# Patient Record
Sex: Male | Born: 1950 | Race: White | Hispanic: No | Marital: Married | Smoking: Former smoker
Health system: Southern US, Community
[De-identification: ages and names within clinical notes are randomized; demographics above are authoritative.]

## PROBLEM LIST (undated history)

## (undated) DIAGNOSIS — E785 Hyperlipidemia, unspecified: Secondary | ICD-10-CM

## (undated) DIAGNOSIS — M1990 Unspecified osteoarthritis, unspecified site: Secondary | ICD-10-CM

## (undated) DIAGNOSIS — I493 Ventricular premature depolarization: Secondary | ICD-10-CM

## (undated) DIAGNOSIS — I480 Paroxysmal atrial fibrillation: Secondary | ICD-10-CM

## (undated) DIAGNOSIS — T7840XA Allergy, unspecified, initial encounter: Secondary | ICD-10-CM

## (undated) DIAGNOSIS — K219 Gastro-esophageal reflux disease without esophagitis: Secondary | ICD-10-CM

## (undated) DIAGNOSIS — K649 Unspecified hemorrhoids: Secondary | ICD-10-CM

## (undated) DIAGNOSIS — I491 Atrial premature depolarization: Secondary | ICD-10-CM

## (undated) DIAGNOSIS — J189 Pneumonia, unspecified organism: Secondary | ICD-10-CM

## (undated) DIAGNOSIS — F419 Anxiety disorder, unspecified: Secondary | ICD-10-CM

## (undated) DIAGNOSIS — R748 Abnormal levels of other serum enzymes: Secondary | ICD-10-CM

## (undated) DIAGNOSIS — D1803 Hemangioma of intra-abdominal structures: Secondary | ICD-10-CM

## (undated) DIAGNOSIS — IMO0002 Reserved for concepts with insufficient information to code with codable children: Secondary | ICD-10-CM

## (undated) DIAGNOSIS — I2510 Atherosclerotic heart disease of native coronary artery without angina pectoris: Secondary | ICD-10-CM

## (undated) DIAGNOSIS — I10 Essential (primary) hypertension: Secondary | ICD-10-CM

## (undated) HISTORY — DX: Reserved for concepts with insufficient information to code with codable children: IMO0002

## (undated) HISTORY — PX: INGUINAL HERNIA REPAIR: SUR1180

## (undated) HISTORY — PX: KNEE SURGERY: SHX244

## (undated) HISTORY — DX: Unspecified osteoarthritis, unspecified site: M19.90

## (undated) HISTORY — DX: Hyperlipidemia, unspecified: E78.5

## (undated) HISTORY — DX: Hemangioma of intra-abdominal structures: D18.03

## (undated) HISTORY — DX: Abnormal levels of other serum enzymes: R74.8

## (undated) HISTORY — DX: Ventricular premature depolarization: I49.3

## (undated) HISTORY — DX: Essential (primary) hypertension: I10

## (undated) HISTORY — PX: SEPTOPLASTY: SUR1290

## (undated) HISTORY — DX: Unspecified hemorrhoids: K64.9

## (undated) HISTORY — DX: Paroxysmal atrial fibrillation: I48.0

## (undated) HISTORY — DX: Allergy, unspecified, initial encounter: T78.40XA

## (undated) HISTORY — DX: Atherosclerotic heart disease of native coronary artery without angina pectoris: I25.10

## (undated) HISTORY — DX: Pneumonia, unspecified organism: J18.9

## (undated) HISTORY — DX: Anxiety disorder, unspecified: F41.9

## (undated) HISTORY — DX: Atrial premature depolarization: I49.1

## (undated) HISTORY — DX: Gastro-esophageal reflux disease without esophagitis: K21.9

---

## 2005-07-12 ENCOUNTER — Encounter: Payer: Self-pay | Admitting: Vascular Surgery

## 2005-07-13 ENCOUNTER — Inpatient Hospital Stay (HOSPITAL_COMMUNITY): Admission: AD | Admit: 2005-07-13 | Discharge: 2005-07-19 | Payer: Self-pay | Admitting: Interventional Cardiology

## 2005-07-13 DIAGNOSIS — I2510 Atherosclerotic heart disease of native coronary artery without angina pectoris: Secondary | ICD-10-CM

## 2005-07-13 HISTORY — PX: CORONARY ARTERY BYPASS GRAFT: SHX141

## 2005-07-13 HISTORY — DX: Atherosclerotic heart disease of native coronary artery without angina pectoris: I25.10

## 2005-08-23 ENCOUNTER — Encounter (HOSPITAL_COMMUNITY): Admission: RE | Admit: 2005-08-23 | Discharge: 2005-11-21 | Payer: Self-pay | Admitting: Interventional Cardiology

## 2005-11-22 ENCOUNTER — Encounter (HOSPITAL_COMMUNITY): Admission: RE | Admit: 2005-11-22 | Discharge: 2006-02-20 | Payer: Self-pay | Admitting: Interventional Cardiology

## 2006-08-06 ENCOUNTER — Ambulatory Visit: Payer: Self-pay | Admitting: Vascular Surgery

## 2006-08-06 ENCOUNTER — Ambulatory Visit (HOSPITAL_COMMUNITY): Admission: RE | Admit: 2006-08-06 | Discharge: 2006-08-06 | Payer: Self-pay | Admitting: Interventional Cardiology

## 2006-08-06 ENCOUNTER — Encounter (INDEPENDENT_AMBULATORY_CARE_PROVIDER_SITE_OTHER): Payer: Self-pay | Admitting: Interventional Cardiology

## 2007-11-17 ENCOUNTER — Ambulatory Visit: Payer: Self-pay | Admitting: Internal Medicine

## 2008-04-05 DIAGNOSIS — I491 Atrial premature depolarization: Secondary | ICD-10-CM

## 2008-04-05 DIAGNOSIS — I493 Ventricular premature depolarization: Secondary | ICD-10-CM

## 2009-04-08 ENCOUNTER — Encounter: Payer: Self-pay | Admitting: Internal Medicine

## 2010-01-26 ENCOUNTER — Encounter: Payer: Self-pay | Admitting: Gastroenterology

## 2010-02-27 ENCOUNTER — Encounter (INDEPENDENT_AMBULATORY_CARE_PROVIDER_SITE_OTHER): Payer: Self-pay

## 2010-02-28 ENCOUNTER — Ambulatory Visit
Admission: RE | Admit: 2010-02-28 | Discharge: 2010-02-28 | Payer: Self-pay | Source: Home / Self Care | Attending: Gastroenterology | Admitting: Gastroenterology

## 2010-03-06 ENCOUNTER — Telehealth: Payer: Self-pay | Admitting: Gastroenterology

## 2010-03-08 ENCOUNTER — Telehealth: Payer: Self-pay | Admitting: Gastroenterology

## 2010-03-10 ENCOUNTER — Encounter: Payer: Self-pay | Admitting: Gastroenterology

## 2010-03-10 ENCOUNTER — Ambulatory Visit
Admission: RE | Admit: 2010-03-10 | Discharge: 2010-03-10 | Payer: Self-pay | Source: Home / Self Care | Attending: Gastroenterology | Admitting: Gastroenterology

## 2010-03-14 ENCOUNTER — Encounter
Admission: RE | Admit: 2010-03-14 | Discharge: 2010-03-14 | Payer: Self-pay | Source: Home / Self Care | Attending: Family Medicine | Admitting: Family Medicine

## 2010-03-21 NOTE — Letter (Signed)
Summary: Pre Visit Letter Revised  [facility]  [address] [place], [place] [address]   Phone: [phone]  Fax: [phone]        01/26/2010 MRN: [id number]  Noah [name] [address] [place], [place]  [address]             Procedure Date:  03-10-10 at 1:30pm           Dr Jarold Motto - Direct Colon   Welcome to the Gastroenterology Division at [facility].    You are scheduled to see a nurse for your pre-procedure visit on 02-28-10 at 11am on the 3rd floor at [facility], [address]  We ask that you try to arrive at our office 15 minutes prior to your appointment time to allow for check-in.  Please take a minute to review the attached form.  If you answer "Yes" to one or more of the questions on the first page, we ask that you call the person listed at your earliest opportunity.  If you answer "No" to all of the questions, please complete the rest of the form and bring it to your appointment.    Your nurse visit will consist of discussing your medical and surgical history, your immediate family medical history, and your medications.   If you are unable to list all of your medications on the form, please bring the medication bottles to your appointment and we will list them.  We will need to be aware of both prescribed and over the counter drugs.  We will need to know exact dosage information as well.    Please be prepared to read and sign documents such as consent forms, a financial agreement, and acknowledgement forms.  If necessary, and with your consent, a friend or relative is welcome to sit-in on the nurse visit with you.  Please bring your insurance card so that we may make a copy of it.  If your insurance requires a referral to see a specialist, please bring your referral form from your primary care physician.  No co-pay is required for this nurse visit.     If you cannot keep your appointment, please call [phone] to cancel or  reschedule prior to your appointment date.  This allows [place] the opportunity to schedule an appointment for another patient in need of care.    Thank you for choosing [facility] for your medical needs.  We appreciate the opportunity to care for you.  Please visit [place] at our website  to learn more about our practice.  Sincerely, The Gastroenterology Division

## 2010-03-23 NOTE — Letter (Signed)
Summary: Moviprep Instructions  [name]  [address]   [place], [place] [address]   Phone: [phone]  Fax: [phone]       Noah [name]    1950-06-04    MRN: [id number]        Procedure Day Noah [name]: Friday, 03-10-10     Arrival Time: 12:30 p.m.      Procedure Time: 1:30 p.m.     Location of Procedure:                    x  [facility] (4th Floor)                        PREPARATION FOR COLONOSCOPY WITH MOVIPREP   Starting 5 days prior to your procedure 03-05-10 do not eat nuts, seeds, popcorn, corn, beans, peas,  salads, or any raw vegetables.  Do not take any fiber supplements (e.g. Metamucil, Citrucel, and Benefiber).  THE DAY BEFORE YOUR PROCEDURE         DATE:  03-09-10  DAY: Thursday  1.  Drink clear liquids the entire day-NO SOLID FOOD  2.  Do not drink anything colored red or purple.  Avoid juices with pulp.  No orange juice.  3.  Drink at least 64 oz. (8 glasses) of fluid/clear liquids during the day to prevent dehydration and help the prep work efficiently.  CLEAR LIQUIDS INCLUDE: Water Jello Ice Popsicles Tea (sugar ok, no milk/cream) Powdered fruit flavored drinks Coffee (sugar ok, no milk/cream) Gatorade Juice: apple, white grape, white cranberry  Lemonade Clear bullion, consomm, broth Carbonated beverages (any kind) Strained chicken noodle soup Hard Candy                             4.  In the morning, mix first dose of MoviPrep solution:    Empty 1 Pouch A and 1 Pouch B into the disposable container    Add lukewarm drinking water to the top line of the container. Mix to dissolve    Refrigerate (mixed solution should be used within 24 hrs)  5.  Begin drinking the prep at 5:00 p.m. The MoviPrep container is divided by 4 marks.   Every 15 minutes drink the solution down to the next mark (approximately 8 oz) until the full liter is complete.   6.  Follow completed prep with 16 oz of clear liquid of your choice  (Nothing red or purple).  Continue to drink clear liquids until bedtime.  7.  Before going to bed, mix second dose of MoviPrep solution:    Empty 1 Pouch A and 1 Pouch B into the disposable container    Add lukewarm drinking water to the top line of the container. Mix to dissolve    Refrigerate  THE DAY OF YOUR PROCEDURE      DATE: 03-10-10  DAY: Friday  Beginning at 8:30 a.m. (5 hours before procedure):         1. Every 15 minutes, drink the solution down to the next mark (approx 8 oz) until the full liter is complete.  2. Follow completed prep with 16 oz. of clear liquid of your choice.    3. You may drink clear liquids until 11:30 a.m. (2 HOURS BEFORE PROCEDURE).   MEDICATION INSTRUCTIONS  Unless otherwise instructed, you should take regular prescription medications with a small sip of water   as early as possible the  morning of your procedure.        OTHER INSTRUCTIONS  You will need a responsible adult at least 62 years of age to accompany you and drive you home.   This person must remain in the waiting room during your procedure.  Wear loose fitting clothing that is easily removed.  Leave jewelry and other valuables at home.  However, you may wish to bring a book to read or  an iPod/MP3 player to listen to music as you wait for your procedure to start.  Remove all body piercing jewelry and leave at home.  Total time from sign-in until discharge is approximately 2-3 hours.  You should go home directly after your procedure and rest.  You can resume normal activities the  day after your procedure.  The day of your procedure you should not:   Drive   Make legal decisions   Operate machinery   Drink alcohol   Return to work  You will receive specific instructions about eating, activities and medications before you leave.    The above instructions have been reviewed and explained to me by   Doristine Church RN II  February 28, 2010 11:25 AM    I fully  understand and can verbalize these instructions _____________________________ Date _________

## 2010-03-23 NOTE — Miscellaneous (Signed)
Summary: [facility] PV  Clinical Lists Changes  Medications: Added new medication of MOVIPREP 100 GM  SOLR (PEG-KCL-NACL-NASULF-NA ASC-C) As per prep instructions. - Signed Rx of MOVIPREP 100 GM  SOLR (PEG-KCL-NACL-NASULF-NA ASC-C) As per prep instructions.;  #1 x 0;  Signed;  Entered by: Doristine Church RN II;  Authorized by: Mardella Layman MD [facility];  Method used: [facility]  [place] [address] [place], [place], [place]  [address], Ph: [phone] or [phone], Fax: [phone] Allergies: Added new allergy or adverse reaction of PCN Added new allergy or adverse reaction of KEFLEX Added new allergy or adverse reaction of LEVAQUIN Observations: Added new observation of ALLERGY REV: Done (02/28/2010 10:45) Added new observation of NKA: F (02/28/2010 10:45)    Prescriptions: MOVIPREP 100 GM  SOLR (PEG-KCL-NACL-NASULF-NA ASC-C) As per prep instructions.  #1 x 0   Entered by:   Doristine Church RN II   Authorized by:   Mardella Layman MD [facility]   Signed by:   Doristine Church RN II on 02/28/2010   Method used:   [facility]        [facility]  [place] [address] [facility]retail[facility]       [address]       [place], [place]  [address]       Ph: [phone] or [phone]       Fax: [phone]   RxID:   [id number]

## 2010-03-23 NOTE — Procedures (Addendum)
Summary: Colonoscopy  Patient: [name] Note: All result statuses are Final unless otherwise noted.  Tests: (1) Colonoscopy (COL)   COL Colonoscopy           DONE     [facility]     [address]     [place], [place]  [address]           COLONOSCOPY PROCEDURE REPORT           PATIENT:  Noah [name], Noah [name]  MR#:  [id number]     BIRTHDATE:  08/22/1950, 59 yrs. old  GENDER:  male     ENDOSCOPIST:  Vania Rea. Jarold Motto, MD, [facility]     REF. BY:  Rocky Morel, M.D.     PROCEDURE DATE:  03/10/2010     PROCEDURE:  Diagnostic Colonoscopy     ASA CLASS:  Class II     INDICATIONS:  Routine Risk Screening     MEDICATIONS:   Fentanyl 75 mcg IV, Versed 8 mg IV           DESCRIPTION OF PROCEDURE:   After the risks benefits and     alternatives of the procedure were thoroughly explained, informed     consent was obtained.  Digital rectal exam was performed and     revealed no abnormalities.   The LB 180AL [id number] endoscope was     introduced through the anus and advanced to the cecum, which was     identified by both the appendix and ileocecal valve, without     limitations.  The quality of the prep was excellent, using     MoviPrep.  The instrument was then slowly withdrawn as the colon     was fully examined.     <<PROCEDUREIMAGES>>           FINDINGS:  No polyps or cancers were seen.  This was otherwise a     normal examination of the colon.   Retroflexed views in the rectum     revealed no abnormalities.    The scope was then withdrawn from     the patient and the procedure completed.           COMPLICATIONS:  None     ENDOSCOPIC IMPRESSION:     1) No polyps or cancers     2) Otherwise normal examination     RECOMMENDATIONS:     1) Continue current colorectal screening recommendations for     "routine risk" patients with a repeat colonoscopy in 10 years.     REPEAT EXAM:  No           ______________________________     Vania Rea. Jarold Motto, MD, [facility]           CC:          n.     eSIGNED:   Vania Rea. Shaylyn Bawa at 03/10/2010 02:08 PM           T[facility], [phone]  Note: An exclamation mark (!) indicates a result that was not dispersed into the flowsheet. Document Creation Date: 03/10/2010 2:09 PM _______________________________________________________________________  (1) Order result status: Final Collection or observation date-time: 03/10/2010 13:59 Requested date-time:  Receipt date-time:  Reported date-time:  Referring Physician:   Ordering Physician: Sheryn Bison [phone]) Specimen Source:  Source: Launa Grill Order Number: [id number] Lab site:   Appended Document: Colonoscopy    Clinical Lists Changes  Observations: Added new observation of COLONNXTDUE: 02/2020 (03/10/2010 14:15)

## 2010-03-23 NOTE — Progress Notes (Signed)
Summary: Prep ?s  Phone Note Call from Patient Call back at [phone]   Caller: Patient Call For: Dr. Jarold Motto Reason for Call: Talk to Nurse Summary of Call: Pt has questions about his prep and restricted diet Initial call taken by: [place] Johnson,  March 06, 2010 10:32 AM  Follow-up for Phone Call        all questions answered Follow-up by: Karl Bales RN,  March 06, 2010 10:49 AM

## 2010-03-29 NOTE — Progress Notes (Signed)
Summary: Triage  Phone Note Call from Patient Call back at [phone]   Caller: Patient Call For: Dr. Jarold Motto Reason for Call: Talk to Nurse Summary of Call: pt. is sch'd for COL on Friday. He called to say "he may have to cancel" his appt. b/c he is going to Dr. today because he is sick. I told pt. he may be charged the cancelation fee b/c he would have needed to call by 5:00 yesterday. He then insisted on speaking with the nurse. Initial call taken by: Karna Christmas,  March 08, 2010 9:27 AM  Follow-up for Phone Call        Dr Jarold Motto, if patient is sick do we charge pt did not resch  Graciella Freer RN  March 08, 2010 9:57 AM   Additional Follow-up for Phone Call Additional follow up Details #1::        no.... Additional Follow-up by: Mardella Layman MD [facility],  March 08, 2010 1:07 PM    Additional Follow-up for Phone Call Additional follow up Details #2::    Patient NOT BILLED. Follow-up by: Leanor Kail [facility],  March 21, 2010 5:18 PM

## 2010-04-04 ENCOUNTER — Telehealth: Payer: Self-pay | Admitting: Gastroenterology

## 2010-04-12 NOTE — Progress Notes (Signed)
Summary: Colon Results  Phone Note Call from Patient Call back at [phone]   Call For: Dr Jarold Motto Reason for Call: Talk to Nurse Summary of Call: Has not gotten his Colon report. His primary care physician also did not get a report. Wonders if something is wrong or if the results are just bad. Would like a call from nurse and to get a copy of the report mailed to him. Initial call taken by: Leanor Kail [facility],  April 04, 2010 11:16 AM  Follow-up for Phone Call        Informed patient his COLON was normal per Dr Jarold Motto. Per Patient request, mailed him a copy of procedure and faxed a copy to his PCP, Dr Salena Saner Miguel Aschoff with [facility]. Follow-up by: Graciella Freer RN,  April 04, 2010 11:47 AM

## 2010-05-22 ENCOUNTER — Other Ambulatory Visit: Payer: Self-pay | Admitting: Family Medicine

## 2010-05-22 DIAGNOSIS — D1800 Hemangioma unspecified site: Secondary | ICD-10-CM

## 2010-05-26 ENCOUNTER — Other Ambulatory Visit: Payer: Self-pay | Admitting: Family Medicine

## 2010-05-26 ENCOUNTER — Ambulatory Visit
Admission: RE | Admit: 2010-05-26 | Discharge: 2010-05-26 | Disposition: A | Discharge status: Home / Self Care | Payer: BC Managed Care – PPO | Source: Ambulatory Visit | Attending: Family Medicine | Admitting: Family Medicine

## 2010-05-26 DIAGNOSIS — D1800 Hemangioma unspecified site: Secondary | ICD-10-CM

## 2010-07-04 NOTE — Letter (Signed)
November 17, 2007    [name], MD  [address]  [place], [place] [address]   RE:  [name], [name]  MRN:  [id number]  /  DOB:  1950-12-19   Dear Vonna Kotyk:   It was my pleasure to see your patient Noah [name] today in EP  consultation regarding therapeutic strategies for symptomatic premature  atrial contractions and premature ventricular contractions.  As you  recall, Noah [name] is a pleasant 60 year old gentleman with a  history of coronary artery disease.  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  He reports that he was  initially diagnosed with symptomatic PVCs in his 62s or 62s.  He  describes these episodes as occasional irregular heartbeat as well as  a forceful heartbeat.  These episodes typically occur only several  times per month.   He subsequently was diagnosed with coronary artery disease, and  underwent three-vessel CABG in 2007.  Since that time, he has been more  aware of his premature ventricular contractions, but does not feel that  they have increased in frequency by any means.  He denies presyncope or  syncope.  Approximately two months ago, he did have an episode of heart  fluttering.  He describes this as 1-2 seconds of heart racing without  associated chest pain, shortness of breath, near syncope, or syncope.  He had a second episode approximately one month ago, and also lasting 1-  2 seconds.  These episodes, both occurred at rest and without  identifiable trigger.   The patient reports feeling mildly dizzy, but denies any other  symptoms.  He has had no further episodes of prolonged palpitations.  He  was evaluated by you several weeks ago, and a LifeWatch monitor was  placed.  This monitor documented predominantly sinus rhythm with rare  premature atrial contractions and premature ventricular contractions.  The patient did have nonsustained atrial tachycardia of approximately 4  beats in duration.  No nonsustained or sustained ventricular  tachycardias were observed.  The patient has also recently had a GXT  myocardial perfusion scan, May 07, 2007, which revealed an ejection  fraction of 69% with an exercise capacity of 10 METS, and no clear  ischemia or infarct seen.  The patient reports being very active at the  present time.  He rides a stationary bike for exercise daily.  He also  is very active in his yard.  He plays golf frequently.  He denies any  symptoms of chest pain, shortness of breath, presyncope, or syncope  during exercise.  He feels that his PVCs typically resolve with  activities.  He is, otherwise, without complaint today.   PAST MEDICAL HISTORY:  1. Coronary artery disease, status post CABG in 2007.  2. Hyperlipidemia.  3. Premature atrial contractions (as above).  4. Premature ventricular contractions (as above).  5. Anxiety.   MEDICATIONS:  1. Lipitor 60 mg daily.  2. Metoprolol 50 mg daily.  3. Centrum Silver daily.  4. CoQ10, 100 mg daily.  5. Fish oil 4000 mg daily.  6. Aspirin 325 mg daily.  7. Metamucil daily.  8. Lorazepam 0.5 mg nightly p.r.n.   ALLERGIES:  No known drug allergies.   SOCIAL HISTORY:  The patient is married.  He is retired and lives in  [place].  He previously worked for [employer].  He denies tobacco or drug  use.  He rarely drinks alcohol, but does note increased premature  ventricular contraction frequency with alcohol consumption.  FAMILY HISTORY:  The patient denies family history of sudden cardiac  death.   REVIEW OF SYSTEMS:  All systems are reviewed, and negative except as  outlined above.   PHYSICAL EXAMINATION:  VITAL SIGNS:  Blood pressure 132/83, heart rate  73, respirations 18, and weight 174 pounds.  GENERAL:  The patient is a fit-appearing male in no acute distress.  He  is alert and oriented x3.  HEENT:  Normocephalic and atraumatic.  Sclerae clear.  Conjunctivae  pink.  Oropharynx is  clear.  NECK:  Supple.  No JVD, lymphadenopathy, or bruits.  No thyromegaly.  LUNGS:  Clear to auscultation bilaterally.  HEART:  Regular rate and rhythm.  No murmurs, rubs, or gallops.  GI:  Soft, nontender, and nondistended.  Positive bowel sounds.  EXTREMITIES:  No clubbing, cyanosis, or edema.  NEUROLOGIC:  Cranial nerves II through XII are intact.  Strength and  sensation are intact.  SKIN:  No ecchymosis or lacerations.  MUSCULOSKELETAL:  No deformity or atrophy.  PSYCH:  Euthymic mood.  Anxious affect.   EKG:  Sinus rhythm at 75 beats per minute, otherwise normal EKG.   IMPRESSION:  Noah [name] is a very pleasant and active 60 year old  gentleman with a history of coronary artery disease who presents for EP  consultation regarding symptomatic premature atrial contractions and  premature ventricular contractions.  The patient reports that his  palpitations are rather rare in frequency.  However, he is concerned  regarding their prognosis.  A recent event monitor has recorded  nonsustained atrial tachycardia, premature atrial contractions, and  premature ventricular contractions.  The predominant rhythm is sinus  rhythm.  No nonsustained or sustained ventricular tachycardias were  observed.  The patient has a preserved ejection fraction, and a very  good exercise capacity.   PLAN:  The etiology and prognosis related to isolated premature atrial  contractions, premature ventricular contractions, and nonsustained  atrial tachycardia were discussed in detail with the patient today.  Given the patient's preserved ejection fraction, I do not think that his  premature ventricular contractions bear any significant prognostic  importance.  The patient admits long-standing premature ventricular  contractions since his late 73s to early 30s, and I suspect that these  are secondary to an automatic firing focus within the ventricles.  I  think that a reentrant mechanism would be highly  unlikely.   Given the relative paucity of symptoms with the patient's premature  atrial contractions and premature ventricular contractions, I think that  the strategy of medical therapy with metoprolol is quite adequate.  I do  not believe that we should pursue antiarrhythmic medications as these  are more likely to be proarrhythmic than to improve the patient's  quality of life.  I also do not believe that the premature atrial  contractions, premature ventricular contractions, or atrial tachycardia  are not of an adequate frequency to lend themselves to a successful  ablation procedure.  I, therefore, think that conservative therapy with  reassurance is appropriate.  The patient will therefore continue his  current medicine strategy, and follow up with you as previously  designed.  He is aware that he may contact my office should any problems  arise.  I have also instructed the  patient to contact your office immediately should he develop sustained  arrhythmias, presyncope, or syncope.    Sincerely,      Hillis Range, MD  Electronically Signed    JA/MedQ  DD: 11/17/2007  DT: 11/18/2007  Job #:  [id number]   CC:   C. Duane Lope, M.D.  Duke Salvia, MD, [facility]

## 2010-07-07 NOTE — Cardiovascular Report (Signed)
NAME:  Noah [name], Noah [name] NO.:  [id number]   MEDICAL RECORD NO.:  [id number]          PATIENT TYPE:  INP   LOCATION:  2301                         FACILITY:  [facility]   PHYSICIAN:  Corky Crafts, MDDATE OF BIRTH:  Apr 29, 1950   DATE OF PROCEDURE:  07/12/2005  DATE OF DISCHARGE:                              CARDIAC CATHETERIZATION   INDICATIONS FOR CATHETERIZATION:  Chest pain,   PROCEDURES PERFORMED:  Left heart catheterization, left ventriculogram,  coronary angiogram, abdominal aortogram.   OPERATOR:  Corky Crafts, MD   PROCEDURE NARRATIVE:  The risks and benefits of cardiac catheterization were  explained to the patient and informed consent was obtained.  The patient was  brought to the catheterization laboratory and placed on the table.  He was  prepped and draped in the usual sterile fashion.  His right groin was  infiltrated with 1% lidocaine.  A 6-French arterial sheath was placed into  his right femoral artery using the modified Seldinger technique.  Left  coronary artery angiography was performed using a JL4.0 catheter.  The  catheter was advanced to the vessel ostium under fluoroscopic guidance.  Digital angiography was performed in all four projections using hand  injection of contrast.  Right coronary artery angiography was then  performed.  A JR4.0 catheter was advanced to the ostium of the right  coronary artery under fluoroscopic guidance.  Visual angiography was  performed in multiple projections using hand injection of contrast.  A  pigtail catheter was then advanced to the ascending aorta and across the  aortic valve under fluoroscopic guidance.  Hemodynamic pressure assessment  was obtained.  A power injection at 30 degrees RAO was performed to image  the left ventricle.  Under continuous hemodynamic pressure monitoring, a  pullback across the aortic valve was performed.  The catheter was pulled  back to the level of the renal arteries.   A power injection of contrast was  used to perform an infrarenal abdominal aortogram.  The sheath was removed  and manual compression was used for hemostasis.   FINDINGS:  The left main was widely patent.  The left anterior descending  had a proximal 90% stenosis.  There was mild atherosclerosis throughout the  remainder of the vessel.  The first diagonal was a medium size vessel,  second diagonal was also a medium size vessel.  Both of these diagonals had  luminal irregularities.  The third diagonal was a small vessel.  The  circumflex had an 80% ostial lesion.  There were mild irregularities  throughout the remainder of the vessel.  The first obtuse marginal also had  mild irregularities.  The right coronary artery was a dominant vessel.  There was diffuse atherosclerosis noted.  There was a 30% proximal lesion  and a 70% distal lesion noted.  The left ventriculogram showed normal left  ventricular function.   HEMODYNAMICS:  Left ventricular pressure 131/7, LV EDP of 8 mmHg, aortic  pressure 127/79, with a mean aortic pressure of 101 mmHg.  The abdominal  aortogram showed no abdominal aortic aneurysm and no renal artery stenosis.   IMPRESSION:  1.  Significant 3-vessel coronary artery disease, including the proximal      left anterior descending, ostial circumflex, and distal right coronary      artery.  2.  Normal left ventricular function.  3.  No renal artery stenosis.   RECOMMENDATIONS:  Will continue aspirin and statin.  The patient will be  observed in a telemetry bed.  A consultation with CBTS will be obtained for  possible bypass surgery.  I will follow up with the patient after his  revascularization.      Corky Crafts, MD  Electronically Signed     JSV/MEDQ  D:  07/13/2005  T:  07/14/2005  Job:  [id number]
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
## 2010-07-07 NOTE — Op Note (Signed)
NAME:  Noah [name], Noah [name] NO.:  [id number]   MEDICAL RECORD NO.:  [id number]          PATIENT TYPE:  INP   LOCATION:  2301                         FACILITY:  [facility]   PHYSICIAN:  Salvatore Decent. Dorris Fetch, M.D.DATE OF BIRTH:  [birth year]   DATE OF PROCEDURE:  07/13/2005  DATE OF DISCHARGE:                                 OPERATIVE REPORT   PREOPERATIVE DIAGNOSIS:  Severe three-vessel coronary disease with unstable  angina.   POSTOPERATIVE DIAGNOSIS:  Severe three-vessel coronary disease with unstable  angina.   PROCEDURE:  Median sternotomy, extracorporeal circulation, coronary bypass  grafting x3 (left internal mammary artery to left anterior descending  artery, free right internal mammary artery to obtuse marginal 1, saphenous  vein graft to posterior descending), endoscopic vein harvest right thigh.   SURGEON:  Salvatore Decent. Dorris Fetch, M.D.   ASSISTANT:  Coral Ceo, P.A.   ANESTHESIA:  General.   FINDINGS:  Diffusely diseased coronaries, fair targets, good-quality  conduits, normal left ventricular size.   CLINICAL NOTE:  Noah [name] is a 60 year old gentleman who has recently  moved to [place]. He has developed new onset exertional angina.  At  catheterization, he had severe three-vessel coronary disease and is referred  for coronary artery bypass grafting.  Indications, risks, benefits and  alternatives were discussed in detail with the patient.  He understood and  accepted risks and agreed to proceed.   OPERATIVE NOTE:  Noah [name] was brought the preoperative holding area on  Jul 13, 2005.  There, lines were placed to monitor arterial, central venous  and pulmonary arterial pressure.  ECG leads were placed for continuous  telemetry.  Intravenous antibiotics were administered.   He was taken to the operating room and anesthetized and intubated.  A Foley  catheter was placed, and chest, abdomen and legs were prepped and draped in  usual fashion.   A median sternotomy was performed.  The left internal  mammary artery was harvested using standard technique.  Simultaneously,  incision was made in the medial aspect of the right leg at the level of the  knee.  The greater saphenous vein was harvested endoscopically from the  lower thigh.  Five thousand units of heparin was administered during the  harvest of the vessels.  The right internal mammary artery then was  harvested using standard technique.  __________ was left attached proximally  initially possibly used as a pedicle graft but subsequently as it was of  insufficient length to reach the obtuse marginal, it was divided proximally  and the proximal stump was suture ligated.  The remainder of the full  heparin dose was administered.   The pericardium was opened.  The ascending aorta was inspected.  There was  no evidence of atherosclerotic disease.  The aorta was cannulated via  concentric pledgeted pursestring sutures.  A dual stage venous cannula was  placed via pursestring suture in the right atrial appendage.  Cardiopulmonary bypass was instituted , and patient was cooled to 62 degrees  Celsius.  The coronary arteries were inspected, and anastomotic sites were  chosen.  The conduits were inspected and cut to length.  A foam pad was  placed in the pericardium to the protect the left phrenic nerve.  A  temperature probe was placed in the myocardial septum, and a cardioplegic  cannula was placed in the ascending aorta.   The aorta was cross clamped.  Left ventricle was entered via the aortic  root.  Cardiac arrest then was achieved with a combination of cold antegrade  blood cardioplegia and topical iced saline.  One liter of cardioplegia was  administered.  Myocardial temperature was 62 degrees Celsius.  The following  distal anastomoses were performed:   First, a reversed saphenous vein graft was placed inside the posterior  descending.  This was a 1.5-mm fair-quality target.   The vein graft was good  quality.  The anastomosis was performed end-to-side with a running 7-0  Prolene suture.  The anastomosis probed easily proximally and distally.  Cardioplegia was administered.  There was good flow and good hemostasis.   Next, the free right internal mammary artery was beveled its distal end.  It  was anastomosed end-to-side to OM1.  OM1 was a 1.3-mm fair-quality target.  The right mammary was a good quality conduit.  The anastomosis was performed  end-to-side with running 8.0 Prolene suture.  Again, a probe passed easily  and proximally and distally.  Additional cardioplegia was administered down  the vein graft in the aortic root.  There was good backbleeding from the  right mammary.   Next, the left internal mammary artery was brought through a window in the  pericardium.  The distal end was spatulated.  It was anastomosed end-to-side  to the distal LAD.  The LAD was a 1.5-mm fair-quality target.  It did have  diffuse disease, but a probe did pass to the apex.  The anastomosis again  was performed with a running 8-0 Prolene suture.  At the completion of the  mammary to LAD anastomosis, the bulldog clamp was briefly removed to inspect  for hemostasis.  Immediate rapid septal rewarming was noted.  The bulldog  clamp was replaced, and the mammary pedicle was tacked to the epicardial  surface with 6-0 Prolene sutures.   Additional cardioplegia was administered.  The cardioplegic cannulas were  removed from the ascending aorta.  The proximal anastomoses were performed  to 4.5-mm punch aortotomies with running 6-0 Prolene sutures the proximal  for the free right mammary graft was done with a 7-0 Prolene suture to a  small segment of saphenous vein.   At completion of the final proximal anastomoses, the patient was placed in  steep Trendelenburg position.  The aortic root was de-aired.  Lidocaine was administered.  The bulldog clamp was again removed from the left  mammary  artery, and the aortic crossclamp was removed.  The total crossclamp time  was 62 minutes.  The patient required a single defibrillation with 20 joules  and then was in sinus rhythm thereafter.   While the patient was being rewarmed, all proximal and distal anastomoses  were inspected for hemostasis.  Epicardial pacing wires were placed on the  right ventricle and right atrium.  When the patient was rewarmed to a core  temperature of 37 degrees Celsius, he was weaned from cardiopulmonary bypass  without difficulty in sinus rhythm and no inotropic support.  Total bypass  time was 98 minutes.  Initial cardiac index was greater than 2 liters per  minute per m2, and the patient remained hemodynamically stable throughout  post-bypass period.   Test dose of protamine was  administered and was well tolerated.  The aortic  cannulae were removed.  The remainder of the protamine was administered  without incident.  The chest was irrigated with 1 liter of warm normal  saline containing 1 gram of vancomycin.  Hemostasis was achieved.  Bilateral  pleural and single mediastinal chest tubes were placed through separate  subcostal incisions.  Pericardium was reapproximated with interrupted 3-0  silk sutures.  These came easily without tension.  The sternum was closed  with combination of single and double heavy  gauge stainless steel wires.  The remainder of the incisions were closed in  standard fashion.  All sponge, needle and instrument counts were correct at  the end of the procedure.  There were no intraoperative complications.  The  patient was taken from the operating room to the surgical intensive care  unit in critical but stable condition.           ______________________________  Salvatore Decent Dorris Fetch, M.D.     SCH/MEDQ  D:  07/13/2005  T:  07/14/2005  Job:  [id number]

## 2011-04-11 ENCOUNTER — Encounter: Payer: Self-pay | Admitting: Internal Medicine

## 2011-04-17 ENCOUNTER — Encounter: Payer: Self-pay | Admitting: Internal Medicine

## 2011-05-06 ENCOUNTER — Encounter: Payer: Self-pay | Admitting: Internal Medicine

## 2011-06-27 ENCOUNTER — Encounter: Payer: Self-pay | Admitting: *Deleted

## 2011-07-18 ENCOUNTER — Encounter: Payer: Self-pay | Admitting: Internal Medicine

## 2011-07-18 ENCOUNTER — Ambulatory Visit (INDEPENDENT_AMBULATORY_CARE_PROVIDER_SITE_OTHER): Payer: BC Managed Care – PPO | Admitting: Internal Medicine

## 2011-07-18 VITALS — BP 132/80 | HR 77 | Resp 18 | Ht 68.0 in | Wt 170.1 lb

## 2011-07-18 DIAGNOSIS — I491 Atrial premature depolarization: Secondary | ICD-10-CM

## 2011-07-18 DIAGNOSIS — I4891 Unspecified atrial fibrillation: Secondary | ICD-10-CM

## 2011-07-18 DIAGNOSIS — I4949 Other premature depolarization: Secondary | ICD-10-CM

## 2011-07-18 LAB — BASIC METABOLIC PANEL
BUN: 14 mg/dL (ref 6–23)
CO2: 27 mEq/L (ref 19–32)
Calcium: 9.5 mg/dL (ref 8.4–10.5)
Chloride: 105 mEq/L (ref 96–112)
Creatinine, Ser: 0.9 mg/dL (ref 0.4–1.5)
GFR: 90.76 mL/min (ref >60.00)
Potassium: 4.4 mEq/L (ref 3.5–5.1)

## 2011-07-18 LAB — MAGNESIUM: Magnesium: 2.1 mg/dL (ref 1.5–2.5)

## 2011-07-18 LAB — T4, FREE: Free T4: 0.71 ng/dL (ref 0.60–1.60)

## 2011-07-18 MED ORDER — METOPROLOL TARTRATE 25 MG PO TABS: 25.0000 mg | ORAL_TABLET | ORAL | Status: DC
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
## 2011-07-18 NOTE — Patient Instructions (Addendum)
Your physician recommends that you schedule a follow-up appointment in: 2 months with Dr Johney Frame  Your physician recommends that you return for lab work today  BMP/MAG/FREE T4  Your physician has recommended you make the following change in your medication:  1) Take Lopressor 25mg  every 6 hours as needed for episodes of afib only

## 2011-07-23 ENCOUNTER — Telehealth: Payer: Self-pay | Admitting: Internal Medicine

## 2011-07-23 NOTE — Telephone Encounter (Signed)
Pt would like a print out of his test results and he will pick them up today and he also has questions about taking his medication and he needs a call before noon he will be going out/lg

## 2011-07-23 NOTE — Telephone Encounter (Signed)
Called patient back Results left out front for him.  ? About Metoprolol---take as needed for afib

## 2011-07-25 ENCOUNTER — Emergency Department (HOSPITAL_COMMUNITY)
Admission: EM | Admit: 2011-07-25 | Discharge: 2011-07-26 | Disposition: A | Discharge status: Home / Self Care | Payer: BC Managed Care – PPO | Attending: Emergency Medicine | Admitting: Emergency Medicine

## 2011-07-25 DIAGNOSIS — R002 Palpitations: Secondary | ICD-10-CM | POA: Insufficient documentation

## 2011-07-25 DIAGNOSIS — M79609 Pain in unspecified limb: Secondary | ICD-10-CM | POA: Insufficient documentation

## 2011-07-25 DIAGNOSIS — Z951 Presence of aortocoronary bypass graft: Secondary | ICD-10-CM | POA: Insufficient documentation

## 2011-07-25 DIAGNOSIS — M25519 Pain in unspecified shoulder: Secondary | ICD-10-CM | POA: Insufficient documentation

## 2011-07-25 DIAGNOSIS — IMO0002 Reserved for concepts with insufficient information to code with codable children: Secondary | ICD-10-CM | POA: Insufficient documentation

## 2011-07-25 DIAGNOSIS — E785 Hyperlipidemia, unspecified: Secondary | ICD-10-CM | POA: Insufficient documentation

## 2011-07-25 DIAGNOSIS — M79602 Pain in left arm: Secondary | ICD-10-CM

## 2011-07-25 DIAGNOSIS — I2510 Atherosclerotic heart disease of native coronary artery without angina pectoris: Secondary | ICD-10-CM | POA: Insufficient documentation

## 2011-07-25 DIAGNOSIS — Z79899 Other long term (current) drug therapy: Secondary | ICD-10-CM | POA: Insufficient documentation

## 2011-07-26 ENCOUNTER — Encounter (HOSPITAL_COMMUNITY): Payer: Self-pay | Admitting: Emergency Medicine

## 2011-07-26 LAB — POCT I-STAT TROPONIN I: Troponin i, poc: 0 ng/mL (ref 0.00–0.08)

## 2011-07-26 LAB — DIFFERENTIAL
Basophils Absolute: 0 K/uL (ref 0.0–0.1)
Basophils Relative: 0 % (ref 0–1)
Eosinophils Absolute: 0.2 K/uL (ref 0.0–0.7)
Eosinophils Relative: 2 % (ref 0–5)
Lymphocytes Relative: 14 % (ref 12–46)
Lymphs Abs: 1.4 K/uL (ref 0.7–4.0)
Monocytes Absolute: 0.7 K/uL (ref 0.1–1.0)
Monocytes Relative: 8 % (ref 3–12)
Neutro Abs: 7.4 K/uL (ref 1.7–7.7)
Neutrophils Relative %: 76 % (ref 43–77)

## 2011-07-26 LAB — CBC
HCT: 44.2 % (ref 39.0–52.0)
Hemoglobin: 15.2 g/dL (ref 13.0–17.0)
MCH: 30.5 pg (ref 26.0–34.0)
MCHC: 34.4 g/dL (ref 30.0–36.0)
MCV: 88.8 fL (ref 78.0–100.0)
Platelets: 211 K/uL (ref 150–400)
RBC: 4.98 MIL/uL (ref 4.22–5.81)
RDW: 13.9 % (ref 11.5–15.5)
WBC: 9.7 K/uL (ref 4.0–10.5)

## 2011-07-26 LAB — BASIC METABOLIC PANEL WITH GFR
BUN: 17 mg/dL (ref 6–23)
CO2: 25 meq/L (ref 19–32)
Calcium: 9.6 mg/dL (ref 8.4–10.5)
Chloride: 104 meq/L (ref 96–112)
Creatinine, Ser: 1.04 mg/dL (ref 0.50–1.35)
GFR calc Af Amer: 87 mL/min — ABNORMAL LOW
GFR calc non Af Amer: 75 mL/min — ABNORMAL LOW
Glucose, Bld: 96 mg/dL (ref 70–99)
Potassium: 4.1 meq/L (ref 3.5–5.1)
Sodium: 140 meq/L (ref 135–145)

## 2011-07-26 NOTE — Discharge Instructions (Signed)
Your workup today has not shown any acute cardiac abnormality.  Follow up with your primary care doctor and/or cardiologist if you continue to have symptoms.  Return to the ER for worsening condition or new concerning symptoms.  Pain of Unknown Etiology (Pain Without a Known Cause) You have come to your caregiver because of pain. Pain can occur in any part of the body. Often there is not a definite cause. If your laboratory (blood or urine) work was normal and x-rays or other studies were normal, your caregiver may treat you without knowing the cause of the pain. An example of this is the headache. Most headaches are diagnosed by taking a history. This means your caregiver asks you questions about your headaches. Your caregiver determines a treatment based on your answers. Usually testing done for headaches is normal. Often testing is not done unless there is no response to medications. Regardless of where your pain is located today, you can be given medications to make you comfortable. If no physical cause of pain can be found, most cases of pain will gradually leave as suddenly as they came.  If you have a painful condition and no reason can be found for the pain, It is importantthat you follow up with your caregiver. If the pain becomes worse or does not go away, it may be necessary to repeat tests and look further for a possible cause.  Only take over-the-counter or prescription medicines for pain, discomfort, or fever as directed by your caregiver.   For the protection of your privacy, test results can not be given over the phone. Make sure you receive the results of your test. Ask as to how these results are to be obtained if you have not been informed. It is your responsibility to obtain your test results.   You may continue all activities unless the activities cause more pain. When the pain lessens, it is important to gradually resume normal activities. Resume activities by beginning slowly and  gradually increasing the intensity and duration of the activities or exercise. During periods of severe pain, bed-rest may be helpful. Lay or sit in any position that is comfortable.   Ice used for acute (sudden) conditions may be effective. Use a large plastic bag filled with ice and wrapped in a towel. This may provide pain relief.   See your caregiver for continued problems. They can help or refer you for exercises or physical therapy if necessary.  If you were given medications for your condition, do not drive, operate machinery or power tools, or sign legal documents for 24 hours. Do not drink alcohol, take sleeping pills, or take other medications that may interfere with treatment. See your caregiver immediately if you have pain that is becoming worse and not relieved by medications. Document Released: 10/31/2000 Document Revised: 01/25/2011 Document Reviewed: 02/05/2005 [facility] Patient Information 2012 [place], [place].  Palpitations  A palpitation is the feeling that your heartbeat is irregular. It may also feel like your heart is beating faster than normal.  HOME CARE To help prevent palpitations:  Do not drink caffeine or alcohol.   Do not eat chocolate.   Do not smoke.   Reduce stress. Try yoga or meditation.   Swim, jogg, or walk.  GET HELP RIGHT AWAY IF:   There is chest pain.   You have shortness of breath.   You deveop a very bad headache.   You develop dizziness or fainting.   You continue to have a fast heartbeat.  The palpitations occur more often.  MAKE SURE YOU:   Understand these instructions.   Will watch your condition.   Will get help right away if you are not doing well or get worse.  Document Released: 11/15/2007 Document Revised: 01/25/2011 Document Reviewed: 11/15/2007 [facility] Patient Information 2012 [place], [place].

## 2011-07-26 NOTE — ED Provider Notes (Signed)
History     CSN: [id number]  Arrival date & time 07/25/11  2335   First MD Initiated Contact with Patient 07/26/11 0158      Chief Complaint  Patient presents with  . Arm Pain    (Consider location/radiation/quality/duration/timing/severity/associated sxs/prior treatment) HPI 61 yo male presents to the ER with complaint of left upper shoulder pain.  Pt is concerned for possible referred pain from a heart attack.  Pt reports onset of left shoulder pain around 7 pm tonight, dull in nature, intermittent, and usually associated with movement of the left arm and shoulder.  He reports having a few more palpations tonight than usual, and after cleaning the house decided to get checked out.  Pt denies sob, diaphoresis, nausea, or chest pain.  Pt has h/o intermittent palpitations, and after holter monitor was told he has occasional afib.  Pt has lopressor to take in case of persistent palpitations, and has never needed to take it.  Pt reports he does have left shoulder pain like he has today from time to time, and has been told he has a pinched nerve in his neck.  Pt reports the pain today was different as usually he can remember doing something that provokes the pain, and has not had any activity recently to explain the pain.  Pt is s/p cabg in 2007.  He has done well since this surgery other than occasional palpitations.  Pt is physically fit and exercises regularly.  He denies any symptoms at this time.   Past Medical History  Diagnosis Date  . Coronary artery disease 07/13/2005    CABG  . Hyperlipemia   . PAC (premature atrial contraction)   . PVC (premature ventricular contraction)   . Anxiety   . Paroxysmal atrial fibrillation   . DJD (degenerative joint disease)   . DDD (degenerative disc disease)     Past Surgical History  Procedure Date  . Coronary artery bypass graft 07/13/2005    Family History  Problem Relation Age of Onset  . Coronary artery disease Father   . Atrial  fibrillation Mother 22    History  Substance Use Topics  . Smoking status: Former Smoker    Types: Cigarettes  . Smokeless tobacco: Not on file   Comment: stopped 32 yrs ago  . Alcohol Use: Yes     rare      Review of Systems  All other systems reviewed and are negative.    Allergies  Azithromycin; Cephalexin; Levofloxacin; and Penicillins  Home Medications   Current Outpatient Rx  Name Route Sig Dispense Refill  . ASPIRIN 325 MG PO TABS Oral Take 325 mg by mouth daily.    Marland Kitchen VITAMIN D 2000 UNITS PO TABS Oral Take 2,000 Units by mouth daily.    Marland Kitchen CO-ENZYME Q-10 50 MG PO CAPS Oral Take 100 mg by mouth daily.    . OMEGA-3 FATTY ACIDS 1000 MG PO CAPS Oral Take 2 g by mouth daily.    Marland Kitchen FLAXSEED (LINSEED) PO Oral Take 2 tablets by mouth daily.    Marland Kitchen LORAZEPAM 0.5 MG PO TABS Oral Take 1 tablet by mouth at bedtime as needed. To help with sleep    . METOPROLOL SUCCINATE ER 50 MG PO TB24 Oral Take 50 mg by mouth daily. Take with or immediately following a meal.    . CENTRUM PO Oral Take 1 tablet by mouth daily.    . BENECOL PO Oral Take 1 tablet by mouth daily. 100mg     .  ROSUVASTATIN CALCIUM 20 MG PO TABS Oral Take 20 mg by mouth daily.    Marland Kitchen METOPROLOL TARTRATE 25 MG PO TABS Oral Take 1 tablet (25 mg total) by mouth as directed. Take one by mouth every 6 hours as needed for episodes of afib 30 tablet 1  . NITROSTAT 0.4 MG SL SUBL        BP 113/67  Pulse 62  Temp(Src) 97.8 F (36.6 C) (Oral)  Resp 18  SpO2 99%  Physical Exam  Nursing note and vitals reviewed. Constitutional: He is oriented to person, place, and time. He appears well-developed and well-nourished.  HENT:  Head: Normocephalic and atraumatic.  Nose: Nose normal.  Mouth/Throat: Oropharynx is clear and moist.  Eyes: Conjunctivae and EOM are normal. Pupils are equal, round, and reactive to light.  Neck: Normal range of motion. Neck supple. No JVD present. No tracheal deviation present. No thyromegaly present.    Cardiovascular: Normal rate, regular rhythm, normal heart sounds and intact distal pulses.  Exam reveals no gallop and no friction rub.   No murmur heard. Pulmonary/Chest: Effort normal and breath sounds normal. No stridor. No respiratory distress. He has no wheezes. He has no rales. He exhibits no tenderness.  Abdominal: Soft. Bowel sounds are normal. He exhibits no distension and no mass. There is no tenderness. There is no rebound and no guarding.  Musculoskeletal: Normal range of motion. He exhibits no edema and no tenderness (no tenderness to left shoulder at this time with movement or palpation).  Lymphadenopathy:    He has no cervical adenopathy.  Neurological: He is oriented to person, place, and time. He exhibits normal muscle tone. Coordination normal.  Skin: Skin is dry. No rash noted. No erythema. No pallor.  Psychiatric: He has a normal mood and affect. His behavior is normal. Judgment and thought content normal.    ED Course  Procedures (including critical care time)  Labs Reviewed  BASIC METABOLIC PANEL - Abnormal; Notable for the following:    GFR calc non Af Amer 75 (*)    GFR calc Af Amer 87 (*)    All other components within normal limits  CBC  DIFFERENTIAL  POCT I-STAT TROPONIN I  POCT I-STAT TROPONIN I  LAB REPORT - SCANNED   No results found.   Date: 07/26/2011  Rate: 75  Rhythm: normal sinus rhythm  QRS Axis: normal  Intervals: normal  ST/T Wave abnormalities: normal  Conduction Disutrbances:none  Narrative Interpretation: early repol and t wave abn have resolved  Old EKG Reviewed: changes noted   1. Left arm pain   2. Palpitations       MDM  61 yo male with left shoulder pain and palpations.  Workup shows negative troponin x2, nsr on ekg, normal electrolytes and cbc.  Pt asymptomatic on exam and while in the ED.  Will d/c home with reassurance and f/u with pcm and cardiology prn        Olivia Mackie, MD 07/27/11 1258

## 2011-07-26 NOTE — ED Notes (Signed)
PT. REPORTS LEFT DELTOID PAIN ONSET THIS EVENING , DENIES INJURY , PT. CONCERNED OF HIS CAD/CABG IN THE PAST.

## 2011-07-26 NOTE — ED Notes (Signed)
C/O upper left arm pain started today. No cp, sob, diaphoresis, n/v. Hx PVCs, PACs, & A-fib.  Pt states that he had a brief moment of painless palpitations and then thought he should come and be checked out. Pt also has hx of HTN & CABG. Currently denies any pain or discomfort at the moment. AAOx4, resp e/u, NAD.

## 2011-08-12 DIAGNOSIS — I4891 Unspecified atrial fibrillation: Secondary | ICD-10-CM | POA: Insufficient documentation

## 2011-08-12 NOTE — Assessment & Plan Note (Signed)
As above.

## 2011-08-12 NOTE — Assessment & Plan Note (Addendum)
The patient has symptomatic paroxysmal atrial fibrillation as well as frequent PACs and PVCs.  I have reviewed his recent event monitor which documents salvos of nonsustained afib/ atrial tachycardia which correlate to his symptoms. Therapeutic strategies for afib and atrial tachycardia including medicine and ablation were discussed in detail with the patient today. Risk, benefits, and alternatives to EP study and radiofrequency ablation for afib were also discussed in detail today.  At this time, he is clear that he would not like to pursue ablation or even antiarrhythmic medicine.  He has found some improvement with beta blockers.  I have therefore advised that he take short acting metoprolol as needed in addition to his daily toprol XL. We will check TFTs, BMet, and Mg today.  He will follow up with Dr Abe People as scheduled and I will see him in 2 months for EP follow-up.

## 2011-08-15 NOTE — Addendum Note (Signed)
Addended by: Kem Parkinson on: 08/15/2011 03:49 PM   Modules accepted: Orders

## 2011-09-19 ENCOUNTER — Ambulatory Visit (INDEPENDENT_AMBULATORY_CARE_PROVIDER_SITE_OTHER): Payer: BC Managed Care – PPO | Admitting: Internal Medicine

## 2011-09-19 ENCOUNTER — Encounter: Payer: Self-pay | Admitting: Internal Medicine

## 2011-09-19 VITALS — BP 118/72 | HR 73 | Resp 17 | Ht 68.0 in | Wt 172.4 lb

## 2011-09-19 DIAGNOSIS — I4949 Other premature depolarization: Secondary | ICD-10-CM

## 2011-09-19 DIAGNOSIS — I4891 Unspecified atrial fibrillation: Secondary | ICD-10-CM

## 2011-09-19 NOTE — Assessment & Plan Note (Signed)
As above.

## 2011-09-19 NOTE — Assessment & Plan Note (Signed)
He feels that his afib is controlled and has not required prn lopressor since his last visit.  He does notice PACs and PVCs more frequently.  He remains clear that he would not like to pursue ablation or even antiarrhythmic medicine.   No changes are made today.  He will follow up with Dr Abe People as scheduled and I will see him as needed.

## 2011-09-19 NOTE — Progress Notes (Signed)
PCP: Daisy Floro, MD Primary Cardiologist:  Dr Nonnie Done is a 61 y.o. male who presents today for routine electrophysiology followup.  Since last being seen in our clinic, the patient reports doing very well.  He denies any further afib.  He has not required prn lopressor.  He continues to have occasional palpitations which are more consistent with PVCs/ PACs.  He finds that these are more frequent with stress.  He now has a fever blister which he thinks has increased the frequency.   Today, he denies symptoms of  chest pain, shortness of breath,  lower extremity edema, dizziness, presyncope, or syncope.  The patient is otherwise without complaint today.   Past Medical History  Diagnosis Date  . Coronary artery disease 07/13/2005    CABG  . Hyperlipemia   . PAC (premature atrial contraction)   . PVC (premature ventricular contraction)   . Anxiety   . Paroxysmal atrial fibrillation   . DJD (degenerative joint disease)   . DDD (degenerative disc disease)    Past Surgical History  Procedure Date  . Coronary artery bypass graft 07/13/2005    Current Outpatient Prescriptions  Medication Sig Dispense Refill  . aspirin 325 MG tablet Take 325 mg by mouth daily.      . Cholecalciferol (VITAMIN D) 2000 UNITS tablet Take 2,000 Units by mouth daily.      Marland Kitchen co-enzyme Q-10 50 MG capsule Take 100 mg by mouth daily. Brand :U Biqunol      . Flaxseed, Linseed, POWD Take 2 scoop by mouth as directed.      Marland Kitchen LORazepam (ATIVAN) 0.5 MG tablet Take 1 tablet by mouth at bedtime as needed. To help with sleep      . metoprolol succinate (TOPROL-XL) 50 MG 24 hr tablet Take 50 mg by mouth daily. Take with or immediately following a meal.      . metoprolol tartrate (LOPRESSOR) 25 MG tablet Take 1 tablet (25 mg total) by mouth as directed. Take one by mouth every 6 hours as needed for episodes of afib  30 tablet  1  . Multiple Vitamins-Minerals (CENTRUM PO) Take 1 tablet by mouth daily.        Marland Kitchen NITROSTAT 0.4 MG SL tablet       . Plant Stanol Ester (BENECOL PO) Take 1 tablet by mouth daily. 100mg       . rosuvastatin (CRESTOR) 20 MG tablet Take 20 mg by mouth daily.        Physical Exam: Filed Vitals:   09/19/11 1541  BP: 118/72  Pulse: 73  Resp: 17  Height: 5\' 8"  (1.727 m)  Weight: 172 lb 6.4 oz (78.2 kg)  SpO2: 93%    GEN- The patient is well appearing, alert and oriented x 3 today.   Head- normocephalic, atraumatic Eyes-  Sclera clear, conjunctiva pink Ears- hearing intact Oropharynx- clear Lungs- Clear to ausculation bilaterally, normal work of breathing Heart- Regular rate and rhythm, no murmurs, rubs or gallops, PMI not laterally displaced GI- soft, NT, ND, + BS Extremities- no clubbing, cyanosis, or edema  ekg today reveals sinus rhythm 73 bpm, PR 182, QRS 86, Qtc 409, otherwise normal ekg  Assessment and Plan:

## 2011-09-19 NOTE — Patient Instructions (Signed)
Your physician recommends that you schedule a follow-up appointment as needed  

## 2011-09-24 ENCOUNTER — Ambulatory Visit: Payer: BC Managed Care – PPO | Admitting: Internal Medicine

## 2012-04-05 ENCOUNTER — Encounter (HOSPITAL_COMMUNITY): Payer: Self-pay

## 2012-04-05 ENCOUNTER — Emergency Department (HOSPITAL_COMMUNITY)
Admission: EM | Admit: 2012-04-05 | Discharge: 2012-04-05 | Disposition: A | Discharge status: Home / Self Care | Payer: BC Managed Care – PPO | Attending: Emergency Medicine | Admitting: Emergency Medicine

## 2012-04-05 DIAGNOSIS — I2510 Atherosclerotic heart disease of native coronary artery without angina pectoris: Secondary | ICD-10-CM | POA: Insufficient documentation

## 2012-04-05 DIAGNOSIS — Z87891 Personal history of nicotine dependence: Secondary | ICD-10-CM | POA: Insufficient documentation

## 2012-04-05 DIAGNOSIS — M79609 Pain in unspecified limb: Secondary | ICD-10-CM | POA: Insufficient documentation

## 2012-04-05 DIAGNOSIS — E785 Hyperlipidemia, unspecified: Secondary | ICD-10-CM | POA: Insufficient documentation

## 2012-04-05 DIAGNOSIS — Z8739 Personal history of other diseases of the musculoskeletal system and connective tissue: Secondary | ICD-10-CM | POA: Insufficient documentation

## 2012-04-05 DIAGNOSIS — Z8679 Personal history of other diseases of the circulatory system: Secondary | ICD-10-CM | POA: Insufficient documentation

## 2012-04-05 DIAGNOSIS — Z79899 Other long term (current) drug therapy: Secondary | ICD-10-CM | POA: Insufficient documentation

## 2012-04-05 DIAGNOSIS — M79662 Pain in left lower leg: Secondary | ICD-10-CM

## 2012-04-05 DIAGNOSIS — Z7982 Long term (current) use of aspirin: Secondary | ICD-10-CM | POA: Insufficient documentation

## 2012-04-05 LAB — POCT I-STAT, CHEM 8
BUN: 13 mg/dL (ref 6–23)
Calcium, Ion: 1.2 mmol/L (ref 1.13–1.30)
Creatinine, Ser: 1 mg/dL (ref 0.50–1.35)
TCO2: 27 mmol/L (ref 0–100)

## 2012-04-05 MED ORDER — IBUPROFEN 600 MG PO TABS: 600.0000 mg | ORAL_TABLET | Freq: Four times a day (QID) | ORAL | Status: DC | PRN

## 2012-04-05 NOTE — Progress Notes (Signed)
VASCULAR LAB PRELIMINARY  PRELIMINARY  PRELIMINARY  PRELIMINARY  Left lower extremity venous Doppler completed.    Preliminary report:  There is no DVT or SVT noted in the left lower extremity.  Christen Wardrop, RVT 04/05/2012, 9:55 AM

## 2012-04-05 NOTE — ED Provider Notes (Signed)
History  This chart was scribed for Loren Racer, MD by Bennett Scrape, ED Scribe. This patient was seen in room A05C/A05C and the patient's care was started at 7:02 AM.  CSN: [id number]  Arrival date & time 04/05/12  6213   First MD Initiated Contact with Patient 04/05/12 [phone]      No chief complaint on file.   Patient is a 62 y.o. male presenting with leg pain. The history is provided by the patient. No language interpreter was used.  Leg Pain Location:  Leg Time since incident:  3 days Injury: no   Leg location:  L lower leg Pain details:    Quality:  Cramping   Radiates to:  Does not radiate   Severity:  Mild   Timing:  Intermittent   Progression:  Unchanged Chronicity:  New Dislocation: no   Prior injury to area:  Yes Relieved by:  Nothing Ineffective treatments:  None tried Associated symptoms: no back pain and no swelling     Noah [name] is a 62 y.o. male who presents to the Emergency Department complaining of 2 days of intermittent, non-radiating left lower leg pain described as a cramp. He denies modifying factors stating that it comes and goes. He reports a prior injury 23 years ago after "popping my achilles tendon". He denies having surgery and states that it healed on his own. He denies experiencing the pain currently and states that he is here for evaluation to be medically cleared for an upcoming airplane trip. He denies leg swelling, CP and SOB as associated symptoms. He reports recently coming off of antibiotics for a cold and states that he has been tolerating POs. He has a h/o CAD, HLD and anxiety. He is an occasional alcohol user and former smoker.   Past Medical History  Diagnosis Date  . Coronary artery disease 07/13/2005    CABG  . Hyperlipemia   . PAC (premature atrial contraction)   . PVC (premature ventricular contraction)   . Anxiety   . Paroxysmal atrial fibrillation   . DJD (degenerative joint disease)   . DDD (degenerative disc  disease)     Past Surgical History  Procedure Laterality Date  . Coronary artery bypass graft  07/13/2005    Family History  Problem Relation Age of Onset  . Coronary artery disease Father   . Atrial fibrillation Mother 85    History  Substance Use Topics  . Smoking status: Former Smoker    Types: Cigarettes  . Smokeless tobacco: Not on file     Comment: stopped 32 yrs ago  . Alcohol Use: Yes     Comment: rare      Review of Systems  Respiratory: Negative for shortness of breath.   Cardiovascular: Negative for chest pain and leg swelling.  Musculoskeletal: Negative for back pain. Myalgias: in the left lower leg.  All other systems reviewed and are negative.    Allergies  Azithromycin; Cephalexin; Levofloxacin; and Penicillins  Home Medications   Current Outpatient Rx  Name  Route  Sig  Dispense  Refill  . aspirin 325 MG tablet   Oral   Take 325 mg by mouth daily.         . Cholecalciferol (VITAMIN D) 2000 UNITS tablet   Oral   Take 2,000 Units by mouth daily.         Marland Kitchen co-enzyme Q-10 50 MG capsule   Oral   Take 100 mg by mouth daily. Brand :U Biqunol         .  Flaxseed, Linseed, POWD   Oral   Take 2 scoop by mouth as directed.         Marland Kitchen LORazepam (ATIVAN) 0.5 MG tablet   Oral   Take 1 tablet by mouth at bedtime as needed. To help with sleep         . metoprolol succinate (TOPROL-XL) 50 MG 24 hr tablet   Oral   Take 50 mg by mouth daily. Take with or immediately following a meal.         . metoprolol tartrate (LOPRESSOR) 25 MG tablet   Oral   Take 1 tablet (25 mg total) by mouth as directed. Take one by mouth every 6 hours as needed for episodes of afib   30 tablet   1   . Multiple Vitamins-Minerals (CENTRUM PO)   Oral   Take 1 tablet by mouth daily.         Marland Kitchen NITROSTAT 0.4 MG SL tablet               . Plant Stanol Ester (BENECOL PO)   Oral   Take 1 tablet by mouth daily. 100mg          . rosuvastatin (CRESTOR) 20 MG  tablet   Oral   Take 20 mg by mouth daily.           Triage Vitals: BP 141/83  Pulse 90  Temp(Src) 98 F (36.7 C) (Oral)  SpO2 100%  Physical Exam  Nursing note and vitals reviewed. Constitutional: He is oriented to person, place, and time. He appears well-developed and well-nourished. No distress.  HENT:  Head: Normocephalic and atraumatic.  Eyes: Conjunctivae and EOM are normal.  Neck: Neck supple. No tracheal deviation present.  Cardiovascular: Normal rate and regular rhythm.   No murmur heard. Pulmonary/Chest: Effort normal and breath sounds normal. No respiratory distress.  Musculoskeletal: Normal range of motion. He exhibits no edema and no tenderness.  Small bruise to posterior left calf, equal distal pulses, no calf swelling or tenderness  Neurological: He is alert and oriented to person, place, and time.  Skin: Skin is warm and dry.  Psychiatric: He has a normal mood and affect. His behavior is normal.    ED Course  Procedures (including critical care time)  DIAGNOSTIC STUDIES: Oxygen Saturation is 100% on room air, normal by my interpretation.    COORDINATION OF CARE: 7:13 AM-Discussed treatment plan which includes [place] with pt at bedside and pt agreed to plan.   Labs Reviewed  POCT I-STAT, CHEM 8 - Abnormal; Notable for the following:    Glucose, Bld 122 (*)    All other components within normal limits     VASCULAR LAB  PRELIMINARY PRELIMINARY PRELIMINARY PRELIMINARY  Left lower extremity venous Doppler completed.  Preliminary report: There is no DVT or SVT noted in the left lower extremity.  KANADY, CANDACE, RVT  04/05/2012, 9:55 AM    No results found.   1. Calf pain, left       MDM  I personally performed the services described in this documentation, which was scribed in my presence. The recorded information has been reviewed and is accurate.     Loren Racer, MD 04/05/12 1556

## 2012-04-05 NOTE — ED Notes (Signed)
Pt reports he was walking yesterday and had onset of left leg pain.  Pt denies hx of blood clots, but is concerned it could be and came to be evaluated.

## 2012-09-04 ENCOUNTER — Encounter: Payer: Self-pay | Admitting: Gastroenterology

## 2012-09-23 ENCOUNTER — Encounter: Payer: Self-pay | Admitting: Gastroenterology

## 2012-09-23 ENCOUNTER — Ambulatory Visit (INDEPENDENT_AMBULATORY_CARE_PROVIDER_SITE_OTHER): Payer: BC Managed Care – PPO | Admitting: Gastroenterology

## 2012-09-23 ENCOUNTER — Other Ambulatory Visit (INDEPENDENT_AMBULATORY_CARE_PROVIDER_SITE_OTHER): Payer: BC Managed Care – PPO

## 2012-09-23 VITALS — BP 112/64 | HR 68 | Ht 67.72 in | Wt 175.4 lb

## 2012-09-23 DIAGNOSIS — R1032 Left lower quadrant pain: Secondary | ICD-10-CM

## 2012-09-23 DIAGNOSIS — K625 Hemorrhage of anus and rectum: Secondary | ICD-10-CM

## 2012-09-23 DIAGNOSIS — K649 Unspecified hemorrhoids: Secondary | ICD-10-CM

## 2012-09-23 DIAGNOSIS — K5900 Constipation, unspecified: Secondary | ICD-10-CM

## 2012-09-23 LAB — CBC WITH DIFFERENTIAL/PLATELET
Basophils Relative: 0.6 % (ref 0.0–3.0)
Eosinophils Relative: 1.9 % (ref 0.0–5.0)
MCV: 89.3 fl (ref 78.0–100.0)
Monocytes Absolute: 0.5 10*3/uL (ref 0.1–1.0)
Monocytes Relative: 5.8 % (ref 3.0–12.0)
Neutrophils Relative %: 74.7 % (ref 43.0–77.0)
RBC: 5.24 Mil/uL (ref 4.22–5.81)
WBC: 7.9 10*3/uL (ref 4.5–10.5)

## 2012-09-23 MED ORDER — LINACLOTIDE 290 MCG PO CAPS: 1.0000 | ORAL_CAPSULE | Freq: Every day | ORAL | Status: DC

## 2012-09-23 MED ORDER — PRAMOXINE-HC 1-1 % EX CREA: TOPICAL_CREAM | Freq: Every day | CUTANEOUS | Status: DC

## 2012-09-23 NOTE — Patient Instructions (Addendum)
We have sent the following medications to your pharmacy for you to pick up at your convenience: Analpram, take as directed  We have given you samples of the following medication to take: Linzess 290 mcg, please take one capsule by mouth once daily. If this works well for you please call our office back for a prescription.  Please follow up with Dr. Jarold Motto in one month   Your physician has requested that you go to the basement for the following lab work before leaving today: Anemia Panel CBC  TSH Hepatic Function Panel BMP Celiac Panel  Information on Constipation and Diverticulosis is below  _______________________________________________________________________________  Constipation, Adult Constipation is when a person has fewer than 3 bowel movements a week; has difficulty having a bowel movement; or has stools that are dry, hard, or larger than normal. As people grow older, constipation is more common. If you try to fix constipation with medicines that make you have a bowel movement (laxatives), the problem may get worse. Long-term laxative use may cause the muscles of the colon to become weak. A low-fiber diet, not taking in enough fluids, and taking certain medicines may make constipation worse. CAUSES   Certain medicines, such as antidepressants, pain medicine, iron supplements, antacids, and water pills.   Certain diseases, such as diabetes, irritable bowel syndrome (IBS), thyroid disease, or depression.   Not drinking enough water.   Not eating enough fiber-rich foods.   Stress or travel.  Lack of physical activity or exercise.  Not going to the restroom when there is the urge to have a bowel movement.  Ignoring the urge to have a bowel movement.  Using laxatives too much. SYMPTOMS   Having fewer than 3 bowel movements a week.   Straining to have a bowel movement.   Having hard, dry, or larger than normal stools.   Feeling full or bloated.   Pain  in the lower abdomen.  Not feeling relief after having a bowel movement. DIAGNOSIS  Your caregiver will take a medical history and perform a physical exam. Further testing may be done for severe constipation. Some tests may include:   A barium enema X-ray to examine your rectum, colon, and sometimes, your small intestine.  A sigmoidoscopy to examine your lower colon.  A colonoscopy to examine your entire colon. TREATMENT  Treatment will depend on the severity of your constipation and what is causing it. Some dietary treatments include drinking more fluids and eating more fiber-rich foods. Lifestyle treatments may include regular exercise. If these diet and lifestyle recommendations do not help, your caregiver may recommend taking over-the-counter laxative medicines to help you have bowel movements. Prescription medicines may be prescribed if over-the-counter medicines do not work.  HOME CARE INSTRUCTIONS   Increase dietary fiber in your diet, such as fruits, vegetables, whole grains, and beans. Limit high-fat and processed sugars in your diet, such as [place] fries, hamburgers, cookies, candies, and soda.   A fiber supplement may be added to your diet if you cannot get enough fiber from foods.   Drink enough fluids to keep your urine clear or pale yellow.   Exercise regularly or as directed by your caregiver.   Go to the restroom when you have the urge to go. Do not hold it.  Only take medicines as directed by your caregiver. Do not take other medicines for constipation without talking to your caregiver first. SEEK IMMEDIATE MEDICAL CARE IF:   You have bright red blood in your stool.   Your  constipation lasts for more than 4 days or gets worse.   You have abdominal or rectal pain.   You have thin, pencil-like stools.  You have unexplained weight loss. MAKE SURE YOU:   Understand these instructions.  Will watch your condition.  Will get help right away if you are not  doing well or get worse. Document Released: 11/04/2003 Document Revised: 04/30/2011 Document Reviewed: 01/09/2011 [employer] Patient Information 2014 [employer], [place]. _______________________________________________________________________________________________________________________  Diverticulosis Diverticulosis is a common condition that develops when small pouches (diverticula) form in the wall of the colon. The risk of diverticulosis increases with age. It happens more often in people who eat a low-fiber diet. Most individuals with diverticulosis have no symptoms. Those individuals with symptoms usually experience abdominal pain, constipation, or loose stools (diarrhea). HOME CARE INSTRUCTIONS   Increase the amount of fiber in your diet as directed by your caregiver or dietician. This may reduce symptoms of diverticulosis.  Your caregiver may recommend taking a dietary fiber supplement.  Drink at least 6 to 8 glasses of water each day to prevent constipation.  Try not to strain when you have a bowel movement.  Your caregiver may recommend avoiding nuts and seeds to prevent complications, although this is still an uncertain benefit.  Only take over-the-counter or prescription medicines for pain, discomfort, or fever as directed by your caregiver. FOODS WITH HIGH FIBER CONTENT INCLUDE:  Fruits. Apple, peach, pear, tangerine, raisins, prunes.  Vegetables. Brussels sprouts, asparagus, broccoli, cabbage, carrot, cauliflower, romaine lettuce, spinach, summer squash, tomato, winter squash, zucchini.  Starchy Vegetables. Baked beans, kidney beans, lima beans, split peas, lentils, potatoes (with skin).  Grains. Whole wheat bread, brown rice, bran flake cereal, plain oatmeal, white rice, shredded wheat, bran muffins. SEEK IMMEDIATE MEDICAL CARE IF:   You develop increasing pain or severe bloating.  You have an oral temperature above 102 F (38.9 C), not controlled by medicine.  You  develop vomiting or bowel movements that are bloody or black. Document Released: 11/03/2003 Document Revised: 04/30/2011 Document Reviewed: 07/06/2009 [facility] Patient Information 2014 [place], [place].

## 2012-09-23 NOTE — Progress Notes (Signed)
History of Present Illness:  This is a 62 year old Caucasian male who has a long history of chronic functional constipation.  I did colonoscopy 2 years ago for screening purposes, and this was unremarkable.  He has a past history of hemorrhoid surgery in 1999.He is concerned about an episode of acute suprapubic pain this past May that required antibiotic therapy by primary care.  He described this as a burning sensation that lasted approximately one week, and was not associated with change in bowels, melena or hematochezia.  The patient follows a high fiber diet and drinks liberal by mouth fluids.  There is no history of upper GI or hepatobiliary complaints except for occasional" bad taste" in his throat.  He denies a specific food intolerances, or family history of chronic gastrointestinal issues.  His appetite is good ,his weight is stable.  Past medical history is remarkable for repair of inguinal hernias, coronary artery bypass surgery, and a history of nonspecific cardiac arrhythmias.  He also has had previous orthopedic surgery on his knees, and a small right liver hemangioma seen on ultrasound of his abdomen in February of 2012.  There is no history of known hepatitis or pancreatitis.  I have reviewed this patient's present history, medical and surgical past history, allergies and medications.     ROS:   All systems were reviewed and are negative unless otherwise stated in the HPI.    Physical Exam: At pressure 112/64, pulse 68 and regular, and weight 175. General well developed well nourished patient in no acute distress, appearing their stated age Eyes PERRLA, no icterus, fundoscopic exam per opthamologist Skin no lesions noted Neck supple, no adenopathy, no thyroid enlargement, no tenderness Chest clear to percussion and auscultation Heart no significant murmurs, gallops or rubs noted Abdomen no hepatosplenomegaly masses or tenderness, BS normal.  Rectal inspection normal no fissures, or  fistulae noted.  No masses or tenderness on digital exam. Mixed hemorrhoids noted and are nonbleeding.  There are external skin tags present.  Rectal tone and sphincter pressure appears normal.  There is no evidence of an impaction, and stool is not present for guaiac testing. Extremities no acute joint lesions, edema, phlebitis or evidence of cellulitis. Neurologic patient oriented x 3, cranial nerves intact, no focal neurologic deficits noted. Psychological mental status normal and normal affect.  Assessment and plan: Chronic functional constipation in a patient who has what sounds like recent subacute diverticulitis.  I placed him on Linzess 290 mcg a day, and will continue a high-fiber diet with liberal by mouth fluids.  Reviewing his records, this patient's had constipation for over 15 years.  I do not think repeat colonoscopy in order this time.  He otherwise seems to be in excellent health at this time, but does have a fair amount of gas and bloating.  I therefore have ordered celiac serology and standard lab review.  He does have some hemorrhoids on exam, but I think these are exacerbated by his constipation, and should improve with better bowel function.  In the interim he dan use Analpram cream at bedtime as needed.

## 2012-09-24 ENCOUNTER — Encounter: Payer: Self-pay | Admitting: Gastroenterology

## 2012-09-24 ENCOUNTER — Other Ambulatory Visit: Payer: Self-pay | Admitting: Gastroenterology

## 2012-09-24 LAB — BASIC METABOLIC PANEL
BUN: 11 mg/dL (ref 6–23)
CO2: 31 mEq/L (ref 19–32)
Chloride: 106 mEq/L (ref 96–112)
GFR: 69.53 mL/min (ref >60.00)
Glucose, Bld: 83 mg/dL (ref 70–99)
Potassium: 4.8 mEq/L (ref 3.5–5.1)

## 2012-09-24 LAB — HEPATIC FUNCTION PANEL
ALT: 58 U/L — ABNORMAL HIGH (ref 0–53)
Total Protein: 7.8 g/dL (ref 6.0–8.3)

## 2012-09-24 LAB — VITAMIN B12: Vitamin B-12: 746 pg/mL (ref 211–911)

## 2012-09-24 LAB — CELIAC PANEL 10
Endomysial Screen: NEGATIVE
IgA: 221 mg/dL (ref 68–379)
Tissue Transglut Ab: 6.1 U/mL (ref <20)

## 2012-09-24 LAB — IBC PANEL
Saturation Ratios: 22.9 % (ref 20.0–50.0)
Transferrin: 302.5 mg/dL (ref 212.0–360.0)

## 2012-09-25 ENCOUNTER — Encounter: Payer: Self-pay | Admitting: Gastroenterology

## 2012-09-25 ENCOUNTER — Telehealth: Payer: Self-pay | Admitting: *Deleted

## 2012-09-25 ENCOUNTER — Ambulatory Visit (HOSPITAL_COMMUNITY)
Admission: RE | Admit: 2012-09-25 | Discharge: 2012-09-25 | Disposition: A | Discharge status: Home / Self Care | Payer: BC Managed Care – PPO | Source: Ambulatory Visit | Attending: Gastroenterology | Admitting: Gastroenterology

## 2012-09-25 DIAGNOSIS — R7989 Other specified abnormal findings of blood chemistry: Secondary | ICD-10-CM | POA: Insufficient documentation

## 2012-09-25 DIAGNOSIS — D1803 Hemangioma of intra-abdominal structures: Secondary | ICD-10-CM | POA: Insufficient documentation

## 2012-09-25 NOTE — Telephone Encounter (Signed)
-----   Message from Noah [name] to Mardella Layman, MD sent at 09/24/2012 5:07 PM ----- Hi.... I have logged into the [employer] and was looking for my test results from yesterday 09/23/2012. I do not see them but Bonita Quin from the office called and told me my liver enzymes were a bit high. I wanted to see all the results and I'm assuming they were normal? Also, ate at noon yesterday and took tylenol the past two weeks so not sure if that affected the test results ----- Message from Noah [name] to Mardella Layman, MD sent at 09/24/2012 5:12 PM ----- Hi again.Marland KitchenMarland KitchenAlso, I saw another section that said a tetanus shot, colonoscopy and shingle vaccine were overdue. This is incorrect data as I just had a tetanus shot as part of my annual flu shot two tears ago, I just had a colonoscopy with fr Eloise Harman in [place] of 2012 and he said see you in ten years and I had the shingles vaccine about 3 years ago. Pls update or check with my md Dr Tenny Craw at [place]

## 2012-09-26 ENCOUNTER — Other Ambulatory Visit: Payer: Self-pay | Admitting: *Deleted

## 2012-09-26 DIAGNOSIS — R7989 Other specified abnormal findings of blood chemistry: Secondary | ICD-10-CM

## 2012-10-07 NOTE — Telephone Encounter (Signed)
Spoke with pt and he will see Dr Patterson on 10/09/12 at 10:30am. 

## 2012-10-07 NOTE — Telephone Encounter (Signed)
Spoke with pt and he will see Dr Jarold Motto on 10/09/12 at 10:30am.

## 2012-10-09 ENCOUNTER — Telehealth: Payer: Self-pay | Admitting: Gastroenterology

## 2012-10-09 ENCOUNTER — Encounter: Payer: Self-pay | Admitting: Gastroenterology

## 2012-10-09 ENCOUNTER — Ambulatory Visit (INDEPENDENT_AMBULATORY_CARE_PROVIDER_SITE_OTHER): Payer: BC Managed Care – PPO | Admitting: Gastroenterology

## 2012-10-09 VITALS — BP 120/76 | HR 92 | Ht 67.0 in | Wt 173.6 lb

## 2012-10-09 DIAGNOSIS — K219 Gastro-esophageal reflux disease without esophagitis: Secondary | ICD-10-CM

## 2012-10-09 DIAGNOSIS — R079 Chest pain, unspecified: Secondary | ICD-10-CM

## 2012-10-09 DIAGNOSIS — K5900 Constipation, unspecified: Secondary | ICD-10-CM

## 2012-10-09 MED ORDER — DEXLANSOPRAZOLE 60 MG PO CPDR: 60.0000 mg | DELAYED_RELEASE_CAPSULE | Freq: Every day | ORAL | Status: DC

## 2012-10-09 NOTE — Telephone Encounter (Signed)
Pt states he scheduled his EGD today, but he has no one to drive him. Also, he is leaving for [place] on Tuesday and he will r/s when he returns. He states dexilant is not covered and he is reluctant to start a PPI anyway d/t a Hx of AFIB. The only other reflux type med he has taken is Tums, which help most of the time. Pt wants to know if Pepcid is safe. He stats he has episodes of a bad taste in his mouth upon wakening. He also reports problems with indigestion after certain spicy meals. Instructed pt he may try Pepcid 20mg  daily while on his trip, but I encouraged him to call to r/s his EGD when he returns because of the possibility of Barrett's. He will call his cardiologist to ask about a "safe" PPI" d/t his AFIB and let us know. He already sleeps upright on pillows because of back and shoulder pain so I feel it's sufficient or equal to raising the height of his bed; pt stated understanding.

## 2012-10-09 NOTE — Telephone Encounter (Signed)
Dexilant is not covered by the patient's insurance Patient said he has not tried any other reflux medication Patient wants to know can he take Pepcid AC without having A-Fib? Patient said he has questions about canceling procedure because he wants to try reflux medication first  I advised patient that I will pass message on to Dr. Norval Gable nurse and she will call him back

## 2012-10-09 NOTE — Progress Notes (Signed)
This is a 61 year old Caucasian male with multiple functional complaints.  He now complains of pain in his right shoulder area, fading skin rashes, acid reflux symptoms, and apparently his previous constipation has improved with Linzess.  He complains of a solid phase his mouth in the morning, but no dysphagia.  We did upper abdominal ultrasound exam which was unremarkable because he had slight hypertension examination anemia.  He relates this to taking too many doses of Tylenol.  Pancreas was not seen on ultrasound, and apparently he arranged for CT scan of the abdomen elsewhere that was done and was normal.  He is on a variety of medications listed and reviewed his chart.  Current Medications, Allergies, Past Medical History, Past Surgical History, Family History and Social History were reviewed in [employer] record.  ROS: All systems were reviewed and are negative unless otherwise stated in the HPI.          Physical Exam: Blood pressure 120/76, pulse 92, and weight 173.  Examination the back area is unremarkable except for small pinpoint papule.  He does have some senile keratoses on his back.  Chest is clear cardiac exam is unremarkable.  He has a well-healed midline chest scar from previous bypass surgery.  There is no hepatosplenomegaly, abdominal masses or tenderness.  I cannot appreciate stigmata of chronic liver disease.  Peripheral extremities unremarkable and mental status is normal.    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  He has rather typical acid reflux, and I do think he needs endoscopy to exclude Barrett's mucosa.  I placed him on Dexilant 60 mg a day pending endoscopic exam.  We will follow his liver function test, and his transaminases remain elevated, we'll do bile and metabolic workups for causes of asymptomatic liver disease.  Time of endoscopy we'll check him for H. pylori and celiac disease.  Colonoscopy was  negative in January of 2012.

## 2012-10-09 NOTE — Patient Instructions (Signed)
You have been scheduled for an endoscopy with propofol. Please follow written instructions given to you at your visit today. If you use inhalers (even only as needed), please bring them with you on the day of your procedure. Your physician has requested that you go to www.startemmi.com and enter the access code given to you at your visit today. This web site gives a general overview about your procedure. However, you should still follow specific instructions given to you by our office regarding your preparation for the procedure.  We have sent the following medications to your pharmacy for you to pick up at your convenience: Dexilant 60 mg, please take one capsule by mouth once daily  Information on reflux is below. _________________________________________  Gastroesophageal Reflux Disease, Adult Gastroesophageal reflux disease (GERD) happens when acid from your stomach flows up into the esophagus. When acid comes in contact with the esophagus, the acid causes soreness (inflammation) in the esophagus. Over time, GERD may create small holes (ulcers) in the lining of the esophagus. CAUSES   Increased body weight. This puts pressure on the stomach, making acid rise from the stomach into the esophagus.  Smoking. This increases acid production in the stomach.  Drinking alcohol. This causes decreased pressure in the lower esophageal sphincter (valve or ring of muscle between the esophagus and stomach), allowing acid from the stomach into the esophagus.  Late evening meals and a full stomach. This increases pressure and acid production in the stomach.  A malformed lower esophageal sphincter. Sometimes, no cause is found. SYMPTOMS   Burning pain in the lower part of the mid-chest behind the breastbone and in the mid-stomach area. This may occur twice a week or more often.  Trouble swallowing.  Sore throat.  Dry cough.  Asthma-like symptoms including chest tightness, shortness of breath, or  wheezing. DIAGNOSIS  Your caregiver may be able to diagnose GERD based on your symptoms. In some cases, X-rays and other tests may be done to check for complications or to check the condition of your stomach and esophagus. TREATMENT  Your caregiver may recommend over-the-counter or prescription medicines to help decrease acid production. Ask your caregiver before starting or adding any new medicines.  HOME CARE INSTRUCTIONS   Change the factors that you can control. Ask your caregiver for guidance concerning weight loss, quitting smoking, and alcohol consumption.  Avoid foods and drinks that make your symptoms worse, such as:  Caffeine or alcoholic drinks.  Chocolate.  Peppermint or mint flavorings.  Garlic and onions.  Spicy foods.  Citrus fruits, such as oranges, lemons, or limes.  Tomato-based foods such as sauce, chili, salsa, and pizza.  Fried and fatty foods.  Avoid lying down for the 3 hours prior to your bedtime or prior to taking a nap.  Eat small, frequent meals instead of large meals.  Wear loose-fitting clothing. Do not wear anything tight around your waist that causes pressure on your stomach.  Raise the head of your bed 6 to 8 inches with wood blocks to help you sleep. Extra pillows will not help.  Only take over-the-counter or prescription medicines for pain, discomfort, or fever as directed by your caregiver.  Do not take aspirin, ibuprofen, or other nonsteroidal anti-inflammatory drugs (NSAIDs). SEEK IMMEDIATE MEDICAL CARE IF:   You have pain in your arms, neck, jaw, teeth, or back.  Your pain increases or changes in intensity or duration.  You develop nausea, vomiting, or sweating (diaphoresis).  You develop shortness of breath, or you faint.  Your vomit is green, yellow, black, or looks like coffee grounds or blood.  Your stool is red, bloody, or black. These symptoms could be signs of other problems, such as heart disease, gastric bleeding, or  esophageal bleeding. MAKE SURE YOU:   Understand these instructions.  Will watch your condition.  Will get help right away if you are not doing well or get worse. Document Released: 11/15/2004 Document Revised: 04/30/2011 Document Reviewed: 08/25/2010 [facility] Patient Information 2014 [place], [place].

## 2012-10-13 ENCOUNTER — Encounter: Payer: BC Managed Care – PPO | Admitting: Gastroenterology

## 2012-10-22 NOTE — Telephone Encounter (Signed)
Faxed a request to Dr Tenny Craw' office, fax [phone], for documentation of Shingles and Tetanus vaccines.

## 2012-10-23 ENCOUNTER — Encounter: Payer: Self-pay | Admitting: Gastroenterology

## 2012-10-24 ENCOUNTER — Other Ambulatory Visit: Payer: Self-pay | Admitting: *Deleted

## 2012-10-28 ENCOUNTER — Ambulatory Visit: Payer: BC Managed Care – PPO | Admitting: Gastroenterology

## 2012-11-06 ENCOUNTER — Encounter: Payer: Self-pay | Admitting: Gastroenterology

## 2012-11-15 ENCOUNTER — Other Ambulatory Visit: Payer: Self-pay | Admitting: Interventional Cardiology

## 2012-11-15 DIAGNOSIS — E785 Hyperlipidemia, unspecified: Secondary | ICD-10-CM

## 2012-11-15 DIAGNOSIS — Z79899 Other long term (current) drug therapy: Secondary | ICD-10-CM

## 2012-11-25 ENCOUNTER — Telehealth: Payer: Self-pay | Admitting: *Deleted

## 2012-11-25 NOTE — Telephone Encounter (Signed)
Spoke with pt who states he will be glad to r/s his appt, but he really hasn't had any problems and is it OK if he just calls when he needs [place] again. Explained to pt I will cancel his appt and please call when he has problems. Pt stated understanding.

## 2012-11-25 NOTE — Telephone Encounter (Signed)
lleft messagew at both phne numbers for pt to call me back. We need to r/s his appt.

## 2012-11-27 ENCOUNTER — Ambulatory Visit: Payer: BC Managed Care – PPO | Admitting: Gastroenterology

## 2012-12-08 ENCOUNTER — Encounter: Payer: Self-pay | Admitting: Gastroenterology

## 2012-12-08 ENCOUNTER — Telehealth: Payer: Self-pay | Admitting: Interventional Cardiology

## 2012-12-08 NOTE — Telephone Encounter (Signed)
New Problem:  Pt states he was reviewing his chart online and wanted clarification on a diagnosis he read. Please advise

## 2012-12-08 NOTE — Telephone Encounter (Signed)
Spoke with pt regarding questions and answered questions for pt.

## 2012-12-19 ENCOUNTER — Telehealth: Payer: Self-pay

## 2012-12-19 MED ORDER — METOPROLOL SUCCINATE ER 25 MG PO TB24: 25.0000 mg | ORAL_TABLET | Freq: Two times a day (BID) | ORAL | Status: DC

## 2012-12-19 NOTE — Telephone Encounter (Signed)
Refilled medication and pt notified.

## 2012-12-23 ENCOUNTER — Other Ambulatory Visit: Payer: BC Managed Care – PPO

## 2012-12-30 ENCOUNTER — Ambulatory Visit: Payer: BC Managed Care – PPO | Admitting: Pharmacist

## 2012-12-30 ENCOUNTER — Other Ambulatory Visit (INDEPENDENT_AMBULATORY_CARE_PROVIDER_SITE_OTHER): Payer: BC Managed Care – PPO

## 2012-12-30 DIAGNOSIS — E785 Hyperlipidemia, unspecified: Secondary | ICD-10-CM

## 2012-12-30 DIAGNOSIS — Z79899 Other long term (current) drug therapy: Secondary | ICD-10-CM

## 2012-12-30 LAB — HEPATIC FUNCTION PANEL
ALT: 41 U/L (ref 0–53)
AST: 40 U/L — ABNORMAL HIGH (ref 0–37)
Albumin: 4.7 g/dL (ref 3.5–5.2)
Alkaline Phosphatase: 80 U/L (ref 39–117)
Bilirubin, Direct: 0.2 mg/dL (ref 0.0–0.3)

## 2012-12-31 LAB — NMR LIPOPROFILE WITH LIPIDS
HDL Size: 9.3 nm (ref >=9.2)
LDL (calc): 56 mg/dL (ref <100)
LDL Particle Number: 918 nmol/L (ref <1000)
LDL Size: 20.6 nm (ref >20.5)
LP-IR Score: 34 (ref <=45)
Large VLDL-P: 1.3 nmol/L (ref <=2.7)
Small LDL Particle Number: 574 nmol/L — ABNORMAL HIGH (ref <=527)
VLDL Size: 43.2 nm (ref <=46.6)

## 2013-01-01 ENCOUNTER — Encounter: Payer: Self-pay | Admitting: Gastroenterology

## 2013-01-07 ENCOUNTER — Ambulatory Visit (INDEPENDENT_AMBULATORY_CARE_PROVIDER_SITE_OTHER): Payer: BC Managed Care – PPO | Admitting: Pharmacist

## 2013-01-07 VITALS — Wt 172.0 lb

## 2013-01-07 DIAGNOSIS — E785 Hyperlipidemia, unspecified: Secondary | ICD-10-CM

## 2013-01-07 DIAGNOSIS — Z79899 Other long term (current) drug therapy: Secondary | ICD-10-CM

## 2013-01-07 MED ORDER — PSYLLIUM 58.6 % PO PACK: 1.0000 | PACK | Freq: Two times a day (BID) | ORAL | Status: DC

## 2013-01-07 NOTE — Assessment & Plan Note (Addendum)
LDL, non-HDL, and LDL-P all well controlled on current regimen of Crestor 20 mg qd, metamucil, and benecol.  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  Liver enzymes improved, and are in good range today.  These were possibly slightly elevated 3 months ago due to his heavy tylenol use at that time, and use of a Z-pak one week prior to lab work.  Patient's diet and exercise regimen remains great as well.  No changes needed at this time.    Plan:  1.  Continue Crestor 20 mg qd, Benecol bid, Metamucil bid. 2.  Check blood work (NMR and LFTs) again 06/10/13. 3.  See me again 06/17/13 at 11:30.

## 2013-01-07 NOTE — Patient Instructions (Signed)
1.  Continue Crestor, Benecol, Metamucil. 2.  Check blood work again 06/10/13. 3.  See me again 06/17/13 at 11:30.

## 2013-01-07 NOTE — Progress Notes (Signed)
Patient here for followup to discuss his cholesterol given h/o elevated LDL-P and h/o CABG.  Patient has been working with me for years for management of his cholesterol.  His stress test was normal late 2013 showing no ischemia.  Cardiac risk factors CAD/CABG, HTN, age.  Target LDL < 70, LDL-P < 1000 nmol/L. Target non-HDL <100.   Medications currently taking: Crestor 20 mg qd, Benecol Chews bid, Metamucil 1-2 tablespoons bid.  Intolerance Lipitor 80 mg (myalgias).   Stopped fish oil on own early 2013 due to report of possible prostate cancer with fish oil use. TG/HDL remains controlled since being off this.   Diet- good low fat diet. 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  Exercise -bikes 5 days of the week. He is no longer walking due to pain in his knees.  Labs excellent NMR results with LDL-P well below 1000 nmol/L. His LDL, non-HDL, TG, and HDL also well controlled.  His weight is hanging around 172 lbs (our scale) which is what it was during his last cholesterol panel 6 months ago. 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  Labs (12/2012):  LDL-P number 918 nmol/L (goal < 1000), small LDL-P 574, LDL 56 mg/dL (goal < 70), non-HDL 74 mg/dL (goal < 161).  TG 92, HDL 48 (Crestor 20 mg qd, Benecol chews bid, metamucil bid).   Liver enzymes improved over past 3 months.  ALT 41 (was 58), and AST 40 (was 41).  Current Outpatient Prescriptions  Medication Sig Dispense Refill  . acetaminophen (TYLENOL) 325 MG tablet Take 650 mg by mouth every 6 (six) hours as needed for pain.      Marland Kitchen aspirin 325 MG tablet Take 325 mg by mouth daily.      . Cholecalciferol (VITAMIN D) 2000 UNITS tablet Take 2,000 Units by mouth daily.      Marland Kitchen co-enzyme Q-10 50 MG capsule Take 100 mg by mouth daily. Brand :U Biqunol      . Flaxseed, Linseed, POWD Take 2 scoop by mouth as directed.      . Linaclotide (LINZESS) 290 MCG CAPS Take 1  capsule by mouth daily.  16 capsule  0  . LORazepam (ATIVAN) 0.5 MG tablet Take 1 tablet by mouth at bedtime as needed. To help with sleep      . metoprolol succinate (TOPROL-XL) 25 MG 24 hr tablet Take 1 tablet (25 mg total) by mouth 2 (two) times daily. Take with or immediately following a meal.  180 tablet  3  . metoprolol tartrate (LOPRESSOR) 25 MG tablet Take 25 mg by mouth as needed.      . Multiple Vitamin (MULTIVITAMIN WITH MINERALS) TABS Take 1 tablet by mouth daily.      Marland Kitchen NITROSTAT 0.4 MG SL tablet Place 0.4 mg under the tongue every 5 (five) minutes as needed for chest pain.       . Plant Stanol Ester (BENECOL PO) Take 2-3 tablets by mouth daily. 100mg       . pramoxine-hydrocortisone (ANALPRAM HC) cream Apply topically at bedtime.  30 g  1  . psyllium (METAMUCIL) 58.6 % packet Take by mouth. Take 4 tablespoons daily      . rosuvastatin (CRESTOR) 20 MG tablet Take 20 mg by mouth daily.       No current facility-administered medications for this visit.   Allergies  Allergen Reactions  . Cephalexin  REACTION: tachycardia  . Levofloxacin     REACTION: tachycardia  . Penicillins     REACTION: rash   Family History  Problem Relation Age of Onset  . Coronary artery disease Father   . Atrial fibrillation Mother 8  . Colon polyps Mother   . Prostate cancer Maternal Uncle

## 2013-01-08 ENCOUNTER — Encounter: Payer: Self-pay | Admitting: Gastroenterology

## 2013-01-21 ENCOUNTER — Encounter: Payer: Self-pay | Admitting: Gastroenterology

## 2013-01-21 ENCOUNTER — Other Ambulatory Visit: Payer: Self-pay | Admitting: *Deleted

## 2013-01-21 MED ORDER — PRAMOXINE-HC 1-1 % EX CREA: TOPICAL_CREAM | Freq: Every day | CUTANEOUS | Status: DC

## 2013-01-27 ENCOUNTER — Other Ambulatory Visit (INDEPENDENT_AMBULATORY_CARE_PROVIDER_SITE_OTHER): Payer: BC Managed Care – PPO

## 2013-01-27 ENCOUNTER — Encounter: Payer: Self-pay | Admitting: Gastroenterology

## 2013-01-27 ENCOUNTER — Ambulatory Visit (INDEPENDENT_AMBULATORY_CARE_PROVIDER_SITE_OTHER): Payer: BC Managed Care – PPO | Admitting: Gastroenterology

## 2013-01-27 VITALS — BP 118/70 | HR 94 | Ht 67.42 in | Wt 175.0 lb

## 2013-01-27 DIAGNOSIS — R1013 Epigastric pain: Secondary | ICD-10-CM

## 2013-01-27 DIAGNOSIS — R7989 Other specified abnormal findings of blood chemistry: Secondary | ICD-10-CM

## 2013-01-27 LAB — CBC WITH DIFFERENTIAL/PLATELET
Basophils Absolute: 0 10*3/uL (ref 0.0–0.1)
Basophils Relative: 0.4 % (ref 0.0–3.0)
Eosinophils Relative: 0.7 % (ref 0.0–5.0)
HCT: 47.6 % (ref 39.0–52.0)
Hemoglobin: 15.9 g/dL (ref 13.0–17.0)
Lymphocytes Relative: 12.6 % (ref 12.0–46.0)
Lymphs Abs: 1.2 10*3/uL (ref 0.7–4.0)
MCHC: 33.5 g/dL (ref 30.0–36.0)
MCV: 89.6 fl (ref 78.0–100.0)
Monocytes Absolute: 0.5 10*3/uL (ref 0.1–1.0)
Monocytes Relative: 5.1 % (ref 3.0–12.0)
Neutro Abs: 7.5 10*3/uL (ref 1.4–7.7)
RBC: 5.31 Mil/uL (ref 4.22–5.81)
RDW: 14.6 % (ref 11.5–14.6)
WBC: 9.2 10*3/uL (ref 4.5–10.5)

## 2013-01-27 LAB — HEPATIC FUNCTION PANEL
ALT: 34 U/L (ref 0–53)
AST: 30 U/L (ref 0–37)
Alkaline Phosphatase: 73 U/L (ref 39–117)
Total Bilirubin: 0.6 mg/dL (ref 0.3–1.2)

## 2013-01-27 LAB — AMYLASE: Amylase: 62 U/L (ref 27–131)

## 2013-01-27 LAB — BASIC METABOLIC PANEL
CO2: 26 mEq/L (ref 19–32)
Calcium: 9.1 mg/dL (ref 8.4–10.5)
Chloride: 103 mEq/L (ref 96–112)
Creatinine, Ser: 1 mg/dL (ref 0.4–1.5)
Glucose, Bld: 107 mg/dL — ABNORMAL HIGH (ref 70–99)
Sodium: 136 mEq/L (ref 135–145)

## 2013-01-27 LAB — IBC PANEL
Iron: 74 ug/dL (ref 42–165)
Saturation Ratios: 17.2 % — ABNORMAL LOW (ref 20.0–50.0)

## 2013-01-27 LAB — LIPASE: Lipase: 26 U/L (ref 11.0–59.0)

## 2013-01-27 LAB — FERRITIN: Ferritin: 35.6 ng/mL (ref 22.0–322.0)

## 2013-01-27 LAB — FOLATE: Folate: >24.8 ng/mL (ref >5.9)

## 2013-01-27 LAB — VITAMIN B12: Vitamin B-12: 758 pg/mL (ref 211–911)

## 2013-01-27 MED ORDER — ESOMEPRAZOLE MAGNESIUM 40 MG PO CPDR: 40.0000 mg | DELAYED_RELEASE_CAPSULE | Freq: Every day | ORAL | Status: DC

## 2013-01-27 NOTE — Progress Notes (Signed)
This is a 62 year old Caucasian male with a history of atrial fibrillation on by mouth aspirin and beta blockers.  He complains a day of periodic episodes of queasiness in his epigastric area with associated diaphoresis followed by a large bowel movement.  He's had no melena, hematochezia, specific hepatobiliary or other upper GI complaints.  He only uses aspirin daily but not NSAIDs.  Last time he was seen in the office, he does complain of constipation, was prescribed Linzess, but never took this medication.  He gives a history of" hypoglycemia" in his brother.  This particular patient has a long history of multiple functional complaints.  Schedule endoscopy several months ago but did not keep his appointment.  He is up-to-date with colonoscopy exam.  He is followed by Dr. Hillis Range in cardiology for his atrial fibrillation.  The patient denies increased palpitations or any other cardiovascular or pulmonary complaints.   Current Medications, Allergies, Past Medical History, Past Surgical History, Family History and Social History were reviewed in [employer] record.  ROS: All systems were reviewed and are negative unless otherwise stated in the HPI.          Physical Exam: Healthy appearing patient no acute distress.  Blood pressure 118/70, pulse 94 and regular and weight 175 with a BMI of 27.08.  There no stigmata of chronic liver disease.  Chest is clear and he is in a regular rhythm without murmurs gallops or rubs.  His abdomen shows no organomegaly, masses or tenderness.  Bowel sounds are normal.  Mental status is normal.  Peripheral extremities are unremarkable.    Assessment and Plan: Probable functional symptoms and a 62 year old patient with stable atrial fibrillation.  I doubt that he has hypoglycemia or anemia, but I have ordered labs for review including CBC, hemoglobin A1c, and metabolic profile.  I placed him on Nexium 40 mg a day, and asked him to check with  his cardiologist in followup as scheduled.  I'm not sure he is primary care physician.  Ultrasound in August of this year was unremarkable except for small liver hemangioma.  He has minimal elevation in his transaminases of unexplained etiology.  Ultrasound showed no evidence of cholelithiasis.  As part of his workup today we'll repeat his liver function tests.   CC; Dr. Hillis Range in cardiology

## 2013-01-27 NOTE — Patient Instructions (Signed)
Please go to the basement for lab work   Samples of Nexium given today, please take one capsule by mouth once daily

## 2013-01-27 NOTE — Addendum Note (Signed)
Addended by: Ok Anis A on: 01/27/2013 04:30 PM   Modules accepted: Orders

## 2013-01-28 ENCOUNTER — Encounter: Payer: Self-pay | Admitting: Pharmacist

## 2013-01-28 LAB — HEPATITIS A ANTIBODY, TOTAL: Hep A Total Ab: NONREACTIVE

## 2013-01-29 ENCOUNTER — Encounter: Payer: Self-pay | Admitting: Gastroenterology

## 2013-01-29 ENCOUNTER — Encounter: Payer: Self-pay | Admitting: Pharmacist

## 2013-01-30 NOTE — Telephone Encounter (Signed)
I don't feel there is any reason you shouldn't be taking a PPI.  Vasoconstriction not an issue with these.  Forwarding to Dr. Eldridge Dace to make aware of the chest discomfort per patient request, then will need to forward this back to Mr. Bunda.

## 2013-02-02 ENCOUNTER — Encounter: Payer: Self-pay | Admitting: Interventional Cardiology

## 2013-02-02 ENCOUNTER — Ambulatory Visit (INDEPENDENT_AMBULATORY_CARE_PROVIDER_SITE_OTHER): Payer: BC Managed Care – PPO | Admitting: Interventional Cardiology

## 2013-02-02 VITALS — BP 146/92 | HR 82 | Ht 68.0 in | Wt 173.8 lb

## 2013-02-02 DIAGNOSIS — I2510 Atherosclerotic heart disease of native coronary artery without angina pectoris: Secondary | ICD-10-CM

## 2013-02-02 DIAGNOSIS — E782 Mixed hyperlipidemia: Secondary | ICD-10-CM

## 2013-02-02 DIAGNOSIS — I4891 Unspecified atrial fibrillation: Secondary | ICD-10-CM

## 2013-02-02 DIAGNOSIS — I4949 Other premature depolarization: Secondary | ICD-10-CM

## 2013-02-02 DIAGNOSIS — I10 Essential (primary) hypertension: Secondary | ICD-10-CM

## 2013-02-02 NOTE — Progress Notes (Signed)
Patient ID: Noah [name], male   DOB: 01-26-1951, 62 y.o.   MRN: [id number]    [address] [place], [place]  [address] Phone: [phone] Fax:  [phone]  Date:  02/02/2013   ID:  Noah [name], DOB Mar 28, 1950, MRN [id number]  PCP:   Duane Lope, MD      History of Present Illness: Noah [name] is a 63 y.o. male who has had CAD. He had CABG  Several years.  He has been doing well prior to 29-May-2022. His father passed away in 2012/05/28. THey were close. He has had a tough time with this. Father had CABG and AVR.  He continues to exercise regularly. He does 30 minutes on the bike daily while in town.  He has had short runs of AFib in the past. Palpitations had stopped since he stopped fish oil due to prostate cancer concerns. BP has ben controlled at home. He is wondering if his hand shaking was from lorazepam withdrawal.  CAD/ASCVD:  Denies : Chest pain.  Diaphoresis.  Dyspnea on exertion.  Leg edema.  Nitroglycerin.  Orthopnea.    Several episodes of abdominal pain with fluttering in his chest.  Sx would resolve after a bowel movement .Palpitations, rare, fel like PVCs. Highest 150/83.        Wt Readings from Last 3 Encounters:  02/02/13 173 lb 12.8 oz (78.835 kg)  01/27/13 175 lb (79.379 kg)  01/07/13 172 lb (78.019 kg)     Past Medical History  Diagnosis Date  . Coronary artery disease 07/13/2005    CABG  . Hyperlipemia   . PAC (premature atrial contraction)   . PVC (premature ventricular contraction)   . Anxiety   . Paroxysmal atrial fibrillation   . DJD (degenerative joint disease)   . DDD (degenerative disc disease)   . Arthritis   . Pneumonia   . Hemangioma of liver   . Elevated liver enzymes     Current Outpatient Prescriptions  Medication Sig Dispense Refill  . acetaminophen (TYLENOL) 325 MG tablet Take 650 mg by mouth every 6 (six) hours as needed for pain.      Marland Kitchen aspirin 325 MG tablet Take 325 mg by mouth daily.      .  Cholecalciferol (VITAMIN D) 2000 UNITS tablet Take 2,000 Units by mouth daily.      Marland Kitchen co-enzyme Q-10 50 MG capsule Take 100 mg by mouth daily. Brand :U Biqunol      . Flaxseed, Linseed, POWD Take 2 scoop by mouth as directed.      . hydrocortisone-pramoxine (ANALPRAM-HC) 2.5-1 % rectal cream       . LORazepam (ATIVAN) 0.5 MG tablet Take 1 tablet by mouth at bedtime as needed. To help with sleep      . metoprolol succinate (TOPROL-XL) 25 MG 24 hr tablet Take 1 tablet (25 mg total) by mouth 2 (two) times daily. Take with or immediately following a meal.  180 tablet  3  . Multiple Vitamin (MULTIVITAMIN WITH MINERALS) TABS Take 1 tablet by mouth daily.      Marland Kitchen NITROSTAT 0.4 MG SL tablet Place 0.4 mg under the tongue every 5 (five) minutes as needed for chest pain.       . Plant Stanol Ester (BENECOL PO) Take 2-3 tablets by mouth daily. 100mg -chews      . pramoxine-hydrocortisone (ANALPRAM HC) cream Apply topically at bedtime.  30 g  3  . psyllium (METAMUCIL) 58.6 % packet  Take 1 packet by mouth 2 (two) times daily. Take 4 tablespoons daily  30 each    . rosuvastatin (CRESTOR) 20 MG tablet Take 20 mg by mouth daily.      Marland Kitchen esomeprazole (NEXIUM) 40 MG capsule Take 1 capsule (40 mg total) by mouth daily at 12 noon.  15 capsule  0   No current facility-administered medications for this visit.    Allergies:    Allergies  Allergen Reactions  . Cephalexin     REACTION: tachycardia  . Levofloxacin     REACTION: tachycardia  . Penicillins     REACTION: rash    Social History:  The patient  reports that he quit smoking about 38 years ago. His smoking use included Cigarettes. He smoked 0.00 packs per day. He has never used smokeless tobacco. He reports that he drinks alcohol. He reports that he does not use illicit drugs.   Family History:  The patient's family history includes Atrial fibrillation (age of onset: 78) in his mother; Colon polyps in his mother; Coronary artery disease in his father;  Prostate cancer in his maternal uncle.   ROS:  Please see the history of present illness.  No nausea, vomiting.  No fevers, chills.  No focal weakness.  No dysuria. As above   All other systems reviewed and negative.   PHYSICAL EXAM: VS:  BP 146/92  Pulse 82  Ht 5\' 8"  (1.727 m)  Wt 173 lb 12.8 oz (78.835 kg)  BMI 26.43 kg/m2 Well nourished, well developed, in no acute distress HEENT: normal Neck: no JVD, no carotid bruits Cardiac:  normal S1, S2; RRR; premature beats Lungs:  clear to auscultation bilaterally, no wheezing, rhonchi or rales Abd: soft, nontender, no hepatomegaly Ext: no edema Skin: warm and dry Neuro:   no focal abnormalities noted  EKG:  NSR, no ST segment changes   , PACs  ASSESSMENT AND PLAN:   Coronary atherosclerosis of native coronary artery  Notes: No angina with exercise. s/p CABG several years ago. Reviewed stress test. This was negative for ischemia. This was done in September 2013. Symptoms are not similar to what he had prior to his CABG. If he has any symptoms like that, we would consider another angiogram.    2. Pure hypercholesterolemia  Continue Crestor Tablet, 20 MG, 1 tablet, Orally (New dosage as of 01/05/11), Once a day Notes: Lipids reviewed and improved. COntinue careful diet. High fiber.    3. Afib  Continue Metoprolol Succinate Tablet Extended Release 24 Hour, 50 MG, 1/2 tablet, Orally, twice a day Continue Aspirin EC Tablet Delayed Release, 325 MG, 1 tablet, Orally, Once a day Notes: Low CHADS score. WOuld not use stronger anticoagulaton at this time. If palpitations get worse, would use LifeWatch monitor to evaluate. Rare PAF in the past.  Current symptoms do not feel like atrial fibrillation. Heart rates have been low on his monitor.    4. Essential hypertension, benign  Notes: Controlled. COntinue to check regularly at home. Occasional spikes with stress or with other sickness.    5. Palpitations:  Continuing Toprol.   Can use  metoprolol prn.  I suspect he had some PACs or PVCs.  Unclear why he had the neck sweating.   Preventive Medicine  Adult topics discussed:  Diet: healthy diet.  Exercise: 5 days a week, at least 30 minutes of aerobic exercise.      Signed, Fredric Mare, MD, [facility] 02/02/2013 3:27 PM

## 2013-02-02 NOTE — Patient Instructions (Signed)
Your physician recommends that you continue on your current medications as directed. Please refer to the Current Medication list given to you today.  Your physician recommends that you follow up as scheduled.  

## 2013-02-03 DIAGNOSIS — I2510 Atherosclerotic heart disease of native coronary artery without angina pectoris: Secondary | ICD-10-CM | POA: Insufficient documentation

## 2013-02-03 DIAGNOSIS — E782 Mixed hyperlipidemia: Secondary | ICD-10-CM | POA: Insufficient documentation

## 2013-02-03 DIAGNOSIS — I10 Essential (primary) hypertension: Secondary | ICD-10-CM | POA: Insufficient documentation

## 2013-02-05 ENCOUNTER — Encounter: Payer: Self-pay | Admitting: Gastroenterology

## 2013-02-06 ENCOUNTER — Encounter: Payer: Self-pay | Admitting: Gastroenterology

## 2013-02-09 NOTE — Telephone Encounter (Signed)
Dr Jarold Motto, pt reviewed his labs and wants to know if he should take oral iron?   Hi..looking at the test results just now all seemed ok except the sugar was 107 without fasting though...and the ferritin seemed low and was different than what I saw In the ibc panel. Do i need to increase ? Please explain the difference in these test results and if I do need to increase iron in my blood. Thanks

## 2013-02-20 ENCOUNTER — Encounter: Payer: Self-pay | Admitting: Pharmacist

## 2013-02-20 MED ORDER — ROSUVASTATIN CALCIUM 20 MG PO TABS: 20.0000 mg | ORAL_TABLET | Freq: Every day | ORAL | Status: DC

## 2013-02-20 MED ORDER — METOPROLOL SUCCINATE ER 25 MG PO TB24: 25.0000 mg | ORAL_TABLET | Freq: Two times a day (BID) | ORAL | Status: DC

## 2013-02-20 NOTE — Telephone Encounter (Signed)
Medication list updated.

## 2013-04-20 ENCOUNTER — Encounter: Payer: Self-pay | Admitting: Gastroenterology

## 2013-05-06 ENCOUNTER — Encounter: Payer: Self-pay | Admitting: Interventional Cardiology

## 2013-05-08 ENCOUNTER — Encounter: Payer: Self-pay | Admitting: Interventional Cardiology

## 2013-05-20 ENCOUNTER — Encounter: Payer: Self-pay | Admitting: Pharmacist

## 2013-05-20 NOTE — Telephone Encounter (Signed)
Ok for fibrinogen if you can find a proper code,

## 2013-05-21 ENCOUNTER — Telehealth: Payer: Self-pay | Admitting: Pharmacist

## 2013-05-21 DIAGNOSIS — E785 Hyperlipidemia, unspecified: Secondary | ICD-10-CM

## 2013-05-21 DIAGNOSIS — Z79899 Other long term (current) drug therapy: Secondary | ICD-10-CM

## 2013-05-21 NOTE — Telephone Encounter (Signed)
Changed future lab from hepatic panel to CMET to get renal function, glucose, and K+ per patient request.

## 2013-06-08 ENCOUNTER — Encounter: Payer: Self-pay | Admitting: Pharmacist

## 2013-06-10 ENCOUNTER — Other Ambulatory Visit: Payer: BC Managed Care – PPO

## 2013-06-17 ENCOUNTER — Ambulatory Visit: Payer: BC Managed Care – PPO | Admitting: Pharmacist

## 2013-06-19 ENCOUNTER — Ambulatory Visit: Payer: BC Managed Care – PPO | Admitting: Interventional Cardiology

## 2013-06-23 ENCOUNTER — Encounter: Payer: Self-pay | Admitting: Pharmacist

## 2013-06-23 ENCOUNTER — Other Ambulatory Visit (INDEPENDENT_AMBULATORY_CARE_PROVIDER_SITE_OTHER): Payer: BC Managed Care – PPO

## 2013-06-23 DIAGNOSIS — R1013 Epigastric pain: Secondary | ICD-10-CM

## 2013-06-23 DIAGNOSIS — E785 Hyperlipidemia, unspecified: Secondary | ICD-10-CM

## 2013-06-23 DIAGNOSIS — R7989 Other specified abnormal findings of blood chemistry: Secondary | ICD-10-CM

## 2013-06-23 DIAGNOSIS — Z79899 Other long term (current) drug therapy: Secondary | ICD-10-CM

## 2013-06-23 DIAGNOSIS — R945 Abnormal results of liver function studies: Secondary | ICD-10-CM

## 2013-06-23 LAB — HEMOGLOBIN A1C: HEMOGLOBIN A1C: 5.3 % (ref 4.6–6.5)

## 2013-06-24 LAB — NMR LIPOPROFILE WITH LIPIDS
CHOLESTEROL, TOTAL: 130 mg/dL (ref <200)
HDL PARTICLE NUMBER: 38.3 umol/L (ref >=30.5)
HDL Size: 8.8 nm — ABNORMAL LOW (ref >=9.2)
HDL-C: 50 mg/dL (ref >=40)
LDL CALC: 58 mg/dL (ref <100)
LDL Particle Number: 1149 nmol/L — ABNORMAL HIGH (ref <1000)
LDL Size: 19.9 nm — ABNORMAL LOW (ref >20.5)
LP-IR SCORE: 60 — AB (ref <=45)
Large HDL-P: 3.6 umol/L — ABNORMAL LOW (ref >=4.8)
Large VLDL-P: 2.7 nmol/L (ref <=2.7)
SMALL LDL PARTICLE NUMBER: 792 nmol/L — AB (ref <=527)
Triglycerides: 108 mg/dL (ref <150)
VLDL Size: 47.8 nm — ABNORMAL HIGH (ref <=46.6)

## 2013-06-26 ENCOUNTER — Telehealth: Payer: Self-pay | Admitting: Pharmacist

## 2013-06-26 ENCOUNTER — Ambulatory Visit (INDEPENDENT_AMBULATORY_CARE_PROVIDER_SITE_OTHER): Payer: BC Managed Care – PPO | Admitting: *Deleted

## 2013-06-26 DIAGNOSIS — Z79899 Other long term (current) drug therapy: Secondary | ICD-10-CM

## 2013-06-26 DIAGNOSIS — E785 Hyperlipidemia, unspecified: Secondary | ICD-10-CM

## 2013-06-26 LAB — COMPREHENSIVE METABOLIC PANEL
ALBUMIN: 4.2 g/dL (ref 3.5–5.2)
ALT: 38 U/L (ref 0–53)
AST: 32 U/L (ref 0–37)
Alkaline Phosphatase: 63 U/L (ref 39–117)
BUN: 12 mg/dL (ref 6–23)
CO2: 29 mEq/L (ref 19–32)
Calcium: 9.2 mg/dL (ref 8.4–10.5)
Chloride: 103 mEq/L (ref 96–112)
Creatinine, Ser: 1.1 mg/dL (ref 0.4–1.5)
GFR: 70.08 mL/min (ref >60.00)
Glucose, Bld: 90 mg/dL (ref 70–99)
POTASSIUM: 4 meq/L (ref 3.5–5.1)
Sodium: 139 mEq/L (ref 135–145)
Total Bilirubin: 1 mg/dL (ref 0.2–1.2)
Total Protein: 7 g/dL (ref 6.0–8.3)

## 2013-06-26 NOTE — Telephone Encounter (Signed)
Patient's CMET wasn't run a few days ago when he came in for lab, and he would like to come in and get it today.  Lab work put in.

## 2013-06-30 ENCOUNTER — Ambulatory Visit (INDEPENDENT_AMBULATORY_CARE_PROVIDER_SITE_OTHER): Payer: BC Managed Care – PPO | Admitting: Pharmacist

## 2013-06-30 VITALS — Wt 170.0 lb

## 2013-06-30 DIAGNOSIS — E785 Hyperlipidemia, unspecified: Secondary | ICD-10-CM

## 2013-06-30 DIAGNOSIS — E782 Mixed hyperlipidemia: Secondary | ICD-10-CM

## 2013-06-30 NOTE — Patient Instructions (Signed)
1.  Continue current medications and diet. 2.  Will recheck cholesterol again in 6 months.  If cholesterol continues to trend up will consider more aggressive medication regimen at that time. 3.  Continue to limit alcohol. 4.  Lab work on 12/22/13 (fasting labs anytime after 7:30 am), see Noah [name] 12/29/13 at 11:30 am to discuss.

## 2013-06-30 NOTE — Assessment & Plan Note (Signed)
No change in meds at this time as this regimen has historically controlled NMR very well.  He will not be travelling as much in the coming months, so will recheck blood work in 6 months.  If LDL-P continues to climb up, will consider Crestor 40 mg +/- Zetia if needed.  He is agreeable to this.  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  Recheck NMR and CMET in 6 months, and see me 1 week later.

## 2013-06-30 NOTE — Progress Notes (Signed)
Patient here for followup to discuss his cholesterol given h/o elevated LDL-P and h/o CABG.  Patient has been working with me for years for management of his cholesterol.  His stress test was normal late 2013 showing no ischemia.   He has been traveling a lot over past 4 months, with multiple trips to [place] and [place].  He admits to eating worse on these trips, and feels his stress level, "and cortisol levels" may be elevated.   We did not check cortisol levels on him.  He states he has had a little more alcohol to drink, but still drinks very little.  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  He tells me his lower back and right hip have been hurting him more lately.  He also tells me he gets some sweats on the right side of his head at night from time to time, and wants to talk with Dr. Irish Lack about this at his visit next week.  He mentions he read that after open heart surgery that the autonomic nervous system can be effected when the chest is opened up, and is curious if this is what is occurring.    Cardiac risk factors CAD/CABG, HTN, age.  Target LDL < 70, LDL-P < 1000 nmol/L. Target non-HDL <100.   Medications currently taking: Crestor 20 mg qd, Benecol Chews bid, Metamucil 1-2 tablespoons bid.  Intolerance Lipitor 80 mg (myalgias).   Stopped fish oil on own early 2013 due to report of possible prostate cancer with fish oil use. TG/HDL remains controlled since being off this.   Diet-  Worse for past few months as travelling a lot, and eating out.  However overall this is typically good low fat diet. 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  Exercise -bikes 5 days of the week. He is no longer walking due to pain in his knees.  Weight has dropped from 172 lbs to 170 lbs.  He tells me his scale shows ~ 165 lb at home.  Labs  (06/2013):  LDL-P number 1149, small  LDL-P 792, LDL 58, TG 108, HDL 50, LFTs normal (Crestor 20 mg qd, Benechol chews bid, metamucil bid, flax seed) (12/2012):  LDL-P number 918 nmol/L (goal < 1000), small LDL-P 574, LDL 56 mg/dL (goal < 70), non-HDL 74 mg/dL (goal < 100).  TG 92, HDL 48 (Crestor 20 mg qd, Benecol chews bid, metamucil bid, flax seed).   Liver enzymes improved over past 3 months.  ALT 41 (was 58), and AST 40 (was 41).  Current Outpatient Prescriptions  Medication Sig Dispense Refill  . acetaminophen (TYLENOL) 325 MG tablet Take 650 mg by mouth every 6 (six) hours as needed for pain.      Marland Kitchen aspirin 325 MG tablet Take 325 mg by mouth daily.      . Cholecalciferol (VITAMIN D) 2000 UNITS tablet Take 2,000 Units by mouth daily.      Marland Kitchen co-enzyme Q-10 50 MG capsule Take 100 mg by mouth daily. Brand :U Biqunol      . esomeprazole (NEXIUM) 40 MG capsule Take 1 capsule (40 mg total) by mouth daily at 12 noon.  15 capsule  0  . Flaxseed, Linseed, POWD Take 2 scoop by mouth as directed.      . hydrocortisone-pramoxine (ANALPRAM-HC) 2.5-1 % rectal cream       .  LORazepam (ATIVAN) 0.5 MG tablet Take 1 tablet by mouth at bedtime as needed. To help with sleep      . metoprolol succinate (TOPROL-XL) 25 MG 24 hr tablet Take 1 tablet (25 mg total) by mouth 2 (two) times daily. Take with or immediately following a meal.  180 tablet  3  . Multiple Vitamin (MULTIVITAMIN WITH MINERALS) TABS Take 1 tablet by mouth daily.      Marland Kitchen NITROSTAT 0.4 MG SL tablet Place 0.4 mg under the tongue every 5 (five) minutes as needed for chest pain.       . Plant Stanol Ester (BENECOL PO) Take 2-3 tablets by mouth daily. 100mg -chews      . pramoxine-hydrocortisone (ANALPRAM HC) cream Apply topically at bedtime.  30 g  3  . psyllium (METAMUCIL) 58.6 % packet Take 1 packet by mouth 2 (two) times daily. Take 4 tablespoons daily  30 each    . rosuvastatin (CRESTOR) 20 MG tablet Take 1 tablet (20 mg total) by mouth daily.  90 tablet  3   No current  facility-administered medications for this visit.   Allergies  Allergen Reactions  . Cephalexin     REACTION: tachycardia  . Levofloxacin     REACTION: tachycardia  . Lipitor [Atorvastatin]     Muscle aches on lipitor 80 mg daily  . Penicillins     REACTION: rash   Family History  Problem Relation Age of Onset  . Coronary artery disease Father   . Atrial fibrillation Mother 15  . Colon polyps Mother   . Prostate cancer Maternal Uncle

## 2013-07-02 ENCOUNTER — Ambulatory Visit: Payer: BC Managed Care – PPO | Admitting: Interventional Cardiology

## 2013-07-10 ENCOUNTER — Encounter: Payer: Self-pay | Admitting: Interventional Cardiology

## 2013-07-10 ENCOUNTER — Ambulatory Visit (INDEPENDENT_AMBULATORY_CARE_PROVIDER_SITE_OTHER): Payer: BC Managed Care – PPO | Admitting: Interventional Cardiology

## 2013-07-10 VITALS — BP 118/80 | HR 86 | Ht 68.0 in | Wt 168.0 lb

## 2013-07-10 DIAGNOSIS — R209 Unspecified disturbances of skin sensation: Secondary | ICD-10-CM

## 2013-07-10 DIAGNOSIS — R200 Anesthesia of skin: Secondary | ICD-10-CM

## 2013-07-10 DIAGNOSIS — E782 Mixed hyperlipidemia: Secondary | ICD-10-CM

## 2013-07-10 DIAGNOSIS — I4891 Unspecified atrial fibrillation: Secondary | ICD-10-CM

## 2013-07-10 DIAGNOSIS — I10 Essential (primary) hypertension: Secondary | ICD-10-CM

## 2013-07-10 DIAGNOSIS — I2510 Atherosclerotic heart disease of native coronary artery without angina pectoris: Secondary | ICD-10-CM

## 2013-07-10 DIAGNOSIS — I491 Atrial premature depolarization: Secondary | ICD-10-CM

## 2013-07-10 NOTE — Patient Instructions (Signed)
Your physician has requested that you have a carotid duplex. This test is an ultrasound of the carotid arteries in your neck. It looks at blood flow through these arteries that supply the brain with blood. Allow one hour for this exam. There are no restrictions or special instructions.  Your physician recommends that you continue on your current medications as directed. Please refer to the Current Medication list given to you today.  Your physician wants you to follow-up in: 6 months with Dr. Varanasi. You will receive a reminder letter in the mail two months in advance. If you don't receive a letter, please call our office to schedule the follow-up appointment.  

## 2013-07-10 NOTE — Progress Notes (Signed)
Patient ID: Noah [name], male   DOB: 03-09-1950, 63 y.o.   MRN: [id number] . Patient ID: Noah [name], male   DOB: 07/24/1950, 63 y.o.   MRN: [id number]       [address] [place], [place]  [address] Phone: [phone] Fax:  [phone]  Date:  07/10/2013   ID:  Noah [name], DOB August 31, 1950, MRN [id number]  PCP:   Melinda Crutch, MD      History of Present Illness: Noah [name] is a 63 y.o. male who has had CAD. He had CABG  Several years.  He has been doing well prior to 2022/05/11. His father passed away in 05/10/2012. THey were close. He has had a tough time with this. Father had CABG and AVR.  He continues to exercise regularly. He does 30 minutes on the bike daily while in town.  He has had short runs of AFib in the past. Palpitations had stopped since he stopped fish oil due to prostate cancer concerns. BP has ben controlled at home. He is wondering if his hand shaking was from lorazepam withdrawal.  CAD/ASCVD:  Denies : Chest pain.  Diaphoresis.  Dyspnea on exertion.  Leg edema.  Nitroglycerin.  Orthopnea.    Several episodes of abdominal pain with fluttering in his chest.  Sx would resolve after a bowel movement .Palpitations, rare, fel like PVCs. Highest 150/83.        Wt Readings from Last 3 Encounters:  07/10/13 168 lb (76.204 kg)  06/30/13 170 lb (77.111 kg)  02/02/13 173 lb 12.8 oz (78.835 kg)     Past Medical History  Diagnosis Date  . Coronary artery disease 07/13/2005    CABG  . Hyperlipemia   . PAC (premature atrial contraction)   . PVC (premature ventricular contraction)   . Anxiety   . Paroxysmal atrial fibrillation   . DJD (degenerative joint disease)   . DDD (degenerative disc disease)   . Arthritis   . Pneumonia   . Hemangioma of liver   . Elevated liver enzymes     Current Outpatient Prescriptions  Medication Sig Dispense Refill  . acetaminophen (TYLENOL) 325 MG tablet Take 650 mg by mouth every 6 (six)  hours as needed for pain.      Marland Kitchen aspirin 325 MG tablet Take 325 mg by mouth daily.      . Cholecalciferol (VITAMIN D) 2000 UNITS tablet Take 2,000 Units by mouth daily.      Marland Kitchen co-enzyme Q-10 50 MG capsule Take 100 mg by mouth daily. Brand :U Biqunol      . esomeprazole (NEXIUM) 40 MG capsule Take 40 mg by mouth as needed.      . Flaxseed, Linseed, POWD Take 2 scoop by mouth as directed.      . hydrocortisone-pramoxine (ANALPRAM-HC) 2.5-1 % rectal cream       . LORazepam (ATIVAN) 0.5 MG tablet Take 1 tablet by mouth at bedtime as needed. To help with sleep      . metoprolol succinate (TOPROL-XL) 25 MG 24 hr tablet Take 1 tablet (25 mg total) by mouth 2 (two) times daily. Take with or immediately following a meal.  180 tablet  3  . Multiple Vitamin (MULTIVITAMIN WITH MINERALS) TABS Take 1 tablet by mouth daily. Centrum silver      . NITROSTAT 0.4 MG SL tablet Place 0.4 mg under the tongue every 5 (five) minutes as needed for chest pain.       Marland Kitchen  Plant Stanol Ester (BENECOL PO) Take 2-3 tablets by mouth daily. 100mg -chews      . pramoxine-hydrocortisone (ANALPRAM HC) cream Apply topically at bedtime.  30 g  3  . psyllium (METAMUCIL) 58.6 % packet Take 1 packet by mouth 2 (two) times daily. Take 4 tablespoons daily  30 each    . rosuvastatin (CRESTOR) 20 MG tablet Take 1 tablet (20 mg total) by mouth daily.  90 tablet  3   No current facility-administered medications for this visit.    Allergies:    Allergies  Allergen Reactions  . Cephalexin     REACTION: tachycardia  . Levofloxacin     REACTION: tachycardia  . Lipitor [Atorvastatin]     Muscle aches on lipitor 80 mg daily  . Penicillins     REACTION: rash    Social History:  The patient  reports that he quit smoking about 39 years ago. His smoking use included Cigarettes. He smoked 0.00 packs per day. He has never used smokeless tobacco. He reports that he drinks alcohol. He reports that he does not use illicit drugs.   Family History:   The patient's family history includes Atrial fibrillation (age of onset: 85) in his mother; Colon polyps in his mother; Coronary artery disease in his father; Prostate cancer in his maternal uncle.   ROS:  Please see the history of present illness.  No nausea, vomiting.  No fevers, chills.  No focal weakness.  No dysuria. Occasional arm numbness/shaking. As above   All other systems reviewed and negative.   PHYSICAL EXAM: VS:  BP 118/80  Pulse 86  Ht 5\' 8"  (1.727 m)  Wt 168 lb (76.204 kg)  BMI 25.55 kg/m2 Well nourished, well developed, in no acute distress HEENT: normal Neck: no JVD, no carotid bruits Cardiac:  normal S1, S2; RRR; premature beats Lungs:  clear to auscultation bilaterally, no wheezing, rhonchi or rales Abd: soft, nontender, no hepatomegaly Ext: no edema Skin: warm and dry Neuro:   no focal abnormalities noted  EKG:  NSR, no ST segment changes   , PACs  ASSESSMENT AND PLAN:   Coronary atherosclerosis of native coronary artery  Notes: No angina with exercise. s/p CABG several years ago. Reviewed stress test. This was negative for ischemia. This was done in September 2013. Symptoms are not similar to what he had prior to his CABG. If he has any symptoms like that, we would consider another angiogram.    2. mixed hypercholesterolemia  Continue Crestor Tablet, 20 MG, 1 tablet, Orally (New dosage as of 01/05/11), Once a day Notes: Lipids reviewed and improved. COntinue careful diet. High fiber.    3. Afib  Continue Metoprolol Succinate Tablet Extended Release 24 Hour, 50 MG, 1/2 tablet, Orally, twice a day Continue Aspirin EC Tablet Delayed Release, 325 MG, 1 tablet, Orally, Once a day Notes: Low CHADS score. WOuld not use stronger anticoagulaton at this time. If palpitations get worse, would use LifeWatch monitor to evaluate. Rare PAF in the past.  Current symptoms do not feel like atrial fibrillation. Heart rates have been low on his monitor.    4. Essential  hypertension, benign  Notes: Controlled. COntinue to check regularly at home. Occasional spikes with stress or with other sickness.    5. Palpitations:  Continuing Toprol.   Can use metoprolol prn.  I suspect he had some PACs or PVCs.  Unclear why he had the neck sweating.   plan carotid Doppler.  Preventive Medicine  Adult topics discussed:  Diet: healthy diet.  Exercise: 5 days a week, at least 30 minutes of aerobic exercise.      Signed, Mina Marble, MD, [facility] 07/10/2013 5:05 PM

## 2013-07-21 ENCOUNTER — Encounter (HOSPITAL_COMMUNITY): Payer: BC Managed Care – PPO

## 2013-07-23 ENCOUNTER — Ambulatory Visit (HOSPITAL_COMMUNITY): Payer: BC Managed Care – PPO | Attending: Cardiovascular Disease | Admitting: Cardiology

## 2013-07-23 DIAGNOSIS — R200 Anesthesia of skin: Secondary | ICD-10-CM

## 2013-07-23 DIAGNOSIS — I6529 Occlusion and stenosis of unspecified carotid artery: Secondary | ICD-10-CM

## 2013-07-23 DIAGNOSIS — R209 Unspecified disturbances of skin sensation: Secondary | ICD-10-CM | POA: Insufficient documentation

## 2013-07-23 NOTE — Progress Notes (Signed)
Carotid duplex complete 

## 2013-08-02 ENCOUNTER — Encounter: Payer: Self-pay | Admitting: Interventional Cardiology

## 2013-12-22 ENCOUNTER — Other Ambulatory Visit: Payer: BC Managed Care – PPO

## 2013-12-29 ENCOUNTER — Ambulatory Visit: Payer: BC Managed Care – PPO | Admitting: Pharmacist

## 2014-01-04 ENCOUNTER — Ambulatory Visit (INDEPENDENT_AMBULATORY_CARE_PROVIDER_SITE_OTHER): Payer: BC Managed Care – PPO | Admitting: Interventional Cardiology

## 2014-01-04 ENCOUNTER — Encounter: Payer: Self-pay | Admitting: Interventional Cardiology

## 2014-01-04 ENCOUNTER — Ambulatory Visit: Payer: BC Managed Care – PPO | Admitting: Interventional Cardiology

## 2014-01-04 VITALS — BP 126/80 | HR 67 | Ht 68.0 in | Wt 173.8 lb

## 2014-01-04 DIAGNOSIS — I10 Essential (primary) hypertension: Secondary | ICD-10-CM

## 2014-01-04 DIAGNOSIS — E782 Mixed hyperlipidemia: Secondary | ICD-10-CM

## 2014-01-04 DIAGNOSIS — I6529 Occlusion and stenosis of unspecified carotid artery: Secondary | ICD-10-CM

## 2014-01-04 DIAGNOSIS — I480 Paroxysmal atrial fibrillation: Secondary | ICD-10-CM

## 2014-01-04 DIAGNOSIS — I2510 Atherosclerotic heart disease of native coronary artery without angina pectoris: Secondary | ICD-10-CM

## 2014-01-04 DIAGNOSIS — R079 Chest pain, unspecified: Secondary | ICD-10-CM

## 2014-01-04 MED ORDER — NITROGLYCERIN 0.4 MG SL SUBL: 0.4000 mg | SUBLINGUAL_TABLET | SUBLINGUAL | Status: DC | PRN

## 2014-01-04 MED ORDER — METOPROLOL SUCCINATE ER 25 MG PO TB24
25.0000 mg | ORAL_TABLET | Freq: Two times a day (BID) | ORAL | Status: DC
Start: 2014-01-04 — End: 2014-02-05

## 2014-01-04 NOTE — Patient Instructions (Addendum)
Your physician wants you to follow-up in: [place] DR. VARANASI. You will receive a reminder letter in the mail two months in advance. If you don't receive a letter, please call our office to schedule the follow-up appointment.  REFILL S[facility]   REFILL MET[facility]ROLOL TO [facility]

## 2014-01-04 NOTE — Progress Notes (Signed)
Patient ID: Noah [name], male   DOB: [birth year], 63 y.o.   MRN: [id number]       [address] [place], [place], [place]  [address] Phone: [phone] Fax:  [phone]  Date:  01/04/2014   ID:  Noah [name], DOB October 30, 1950, MRN [id number]  PCP:   Noah Crutch, MD      History of Present Illness: Noah [name] is a 63 y.o. male who has had CAD. He had CABG  Several years ago.   He continues to exercise regularly. He does 30 minutes on the bike daily while in town.  He has had short runs of AFib in the past. Palpitations had stopped since he stopped fish oil due to prostate cancer concerns. BP has ben controlled at home. CAD/ASCVD:  Denies : Chest pain.  Diaphoresis.  Dyspnea on exertion.  Leg edema.  Nitroglycerin.  Orthopnea.    He is concerned that his lipids are getting worse.  He stopped fish oil and his palpitations improved.  He stopped due to a concern about prostate cancer. He has now read that there is no longer an association between fish oil and prostate cancer. Given his palpitations are better, he prefers to stay off of the 4 g per day fish oil. However, given that his triglycerides have trended up, he is concerned enough to perhaps try lower dose fish oil.      Wt Readings from Last 3 Encounters:  01/04/14 173 lb 12.8 oz (78.835 kg)  07/10/13 168 lb (76.204 kg)  06/30/13 170 lb (77.111 kg)     Past Medical History  Diagnosis Date  . Coronary artery disease 07/13/2005    CABG  . Hyperlipemia   . PAC (premature atrial contraction)   . PVC (premature ventricular contraction)   . Anxiety   . Paroxysmal atrial fibrillation   . DJD (degenerative joint disease)   . DDD (degenerative disc disease)   . Arthritis   . Pneumonia   . Hemangioma of liver   . Elevated liver enzymes     Current Outpatient Prescriptions  Medication Sig Dispense Refill  . acetaminophen (TYLENOL) 325 MG tablet Take 650 mg by mouth every 6 (six) hours as needed  for pain.    Marland Kitchen Alum & Mag Hydroxide-Simeth (MAGIC MOUTHWASH) SOLN   1  . aspirin 325 MG tablet Take 325 mg by mouth daily.    . Cholecalciferol (VITAMIN D) 2000 UNITS tablet Take 2,000 Units by mouth daily.    Marland Kitchen co-enzyme Q-10 50 MG capsule Take 100 mg by mouth daily. Brand :U Biqunol    . esomeprazole (NEXIUM) 40 MG capsule Take 40 mg by mouth as needed.    . Flaxseed, Linseed, POWD Take 2 scoop by mouth as directed.    . hydrocortisone-pramoxine (ANALPRAM-HC) 2.5-1 % rectal cream     . LORazepam (ATIVAN) 0.5 MG tablet Take 1 tablet by mouth at bedtime as needed. To help with sleep    . metoprolol succinate (TOPROL-XL) 25 MG 24 hr tablet Take 1 tablet (25 mg total) by mouth 2 (two) times daily. Take with or immediately following a meal. 180 tablet 3  . Multiple Vitamin (MULTIVITAMIN WITH MINERALS) TABS Take 1 tablet by mouth daily. Centrum silver    . NITROSTAT 0.4 MG SL tablet Place 0.4 mg under the tongue every 5 (five) minutes as needed for chest pain.     Marland Kitchen nystatin (MYCOSTATIN) 100000 UNIT/ML suspension     . Plant [employer]  Ester (BENECOL PO) Take 2-3 tablets by mouth daily. 100mg -chews    . pramoxine-hydrocortisone (ANALPRAM HC) cream Apply topically at bedtime. 30 g 3  . psyllium (METAMUCIL) 58.6 % packet Take 1 packet by mouth 2 (two) times daily. Take 4 tablespoons daily 30 each   . rosuvastatin (CRESTOR) 20 MG tablet Take 1 tablet (20 mg total) by mouth daily. 90 tablet 3   No current facility-administered medications for this visit.    Allergies:    Allergies  Allergen Reactions  . Cephalexin     REACTION: tachycardia  . Levofloxacin     REACTION: tachycardia  . Lipitor [Atorvastatin]     Muscle aches on lipitor 80 mg daily  . Penicillins     REACTION: rash    Social History:  The patient  reports that he quit smoking about 39 years ago. His smoking use included Cigarettes. He smoked 0.00 packs per day. He has never used smokeless tobacco. He reports that he drinks  alcohol. He reports that he does not use illicit drugs.   Family History:  The patient's family history includes Atrial fibrillation (age of onset: 71) in his mother; Colon polyps in his mother; Coronary artery disease in his father; Prostate cancer in his maternal uncle.   ROS:  Please see the history of present illness.  No nausea, vomiting.  No fevers, chills.  No focal weakness.  No dysuria. Occasional arm numbness/shaking. As above   All other systems reviewed and negative.   PHYSICAL EXAM: VS:  BP 126/80 mmHg  Pulse 67  Ht 5\' 8"  (1.727 m)  Wt 173 lb 12.8 oz (78.835 kg)  BMI 26.43 kg/m2 Well nourished, well developed, in no acute distress HEENT: normal Neck: no JVD, no carotid bruits Cardiac:  normal S1, S2; RRR; no premature beats today Lungs:  clear to auscultation bilaterally, no wheezing, rhonchi or rales Abd: soft, nontender, no hepatomegaly Ext: no edema Skin: warm and dry Neuro:   no focal abnormalities noted Psych: mildly anxious   ECG: NSR, no ST segment changes ASSESSMENT AND PLAN:   Coronary atherosclerosis of native coronary artery  Notes: No angina with exercise- does report that his exercise is not as vigorous as it used to be. He thinks this is age-related. s/p CABG several years ago.  Last stress test was negative for ischemia in September 2013.Continue aggressive secondary prevention. RF SL NTG. He has not used it recently, but just wants a fresh bottle.    2. mixed hypercholesterolemia  Continue Crestor Tablet, 20 MG, 1 tablet, Orally  Once a day Notes: Lipids reviewed and improved. COntinue careful diet. High fiber.   His TG were trending up.  92 > 108 in 5/15.  He will start lower dose fish oil, 1000 mg daily after this next lipid check.  As above, it is noted that he feels better off of the fish oil.  He is quite annoyed that his numbers are trending up although he admits to some dietary indiscretion.  He has had several trips and it is harder to be  disciplined out of town.   3. Afib  Continue Metoprolol Succinate Tablet Extended Release 24 Hour, 25 MG, 1 tablet, Orally, twice a day Continue Aspirin EC Tablet Delayed Release, 325 MG, 1 tablet, Orally, Once a day Notes: Low CHADS score. Would not use stronger anticoagulaton at this time. If palpitations get worse, would use LifeWatch monitor to evaluate. Rare PAF in the past.  Current symptoms do not feel like atrial  fibrillation. Heart rates have been low on his monitor. Less palpitations since he has been off of fish oil.   4. Essential hypertension, benign  Notes: Controlled. COntinue to check regularly at home. No recent spikes.  Continue current meds.   5. Palpitations:  Continuing Toprol.   Can use metoprolol prn.  He has had some PACs or PVCs. No recent palpitations.   6. Carotid atherosclerosis: mild plaque bilaterally.  Medical therapy.  Scheduled for repeat Doppler in 6/16. Preventive Medicine  Adult topics discussed:  Diet: healthy diet.  Exercise: 5 days a week, at least 30 minutes of aerobic exercise.      Signed, Mina Marble, MD, [facility] 01/04/2014 4:41 PM

## 2014-01-05 ENCOUNTER — Other Ambulatory Visit (INDEPENDENT_AMBULATORY_CARE_PROVIDER_SITE_OTHER): Payer: BC Managed Care – PPO | Admitting: *Deleted

## 2014-01-05 DIAGNOSIS — E782 Mixed hyperlipidemia: Secondary | ICD-10-CM

## 2014-01-05 DIAGNOSIS — E785 Hyperlipidemia, unspecified: Secondary | ICD-10-CM

## 2014-01-05 DIAGNOSIS — I6529 Occlusion and stenosis of unspecified carotid artery: Secondary | ICD-10-CM | POA: Insufficient documentation

## 2014-01-06 LAB — COMPREHENSIVE METABOLIC PANEL
ALBUMIN: 4.2 g/dL (ref 3.5–5.2)
ALK PHOS: 69 U/L (ref 39–117)
ALT: 45 U/L (ref 0–53)
AST: 38 U/L — ABNORMAL HIGH (ref 0–37)
BUN: 16 mg/dL (ref 6–23)
CO2: 27 mEq/L (ref 19–32)
Calcium: 9 mg/dL (ref 8.4–10.5)
Chloride: 107 mEq/L (ref 96–112)
Creatinine, Ser: 1.1 mg/dL (ref 0.4–1.5)
GFR: 69.96 mL/min (ref >60.00)
Glucose, Bld: 89 mg/dL (ref 70–99)
Potassium: 4.5 mEq/L (ref 3.5–5.1)
Sodium: 140 mEq/L (ref 135–145)
Total Bilirubin: 0.6 mg/dL (ref 0.2–1.2)
Total Protein: 7.1 g/dL (ref 6.0–8.3)

## 2014-01-07 LAB — NMR LIPOPROFILE WITH LIPIDS
CHOLESTEROL, TOTAL: 137 mg/dL (ref 100–199)
HDL Particle Number: 40 umol/L (ref >=30.5)
HDL SIZE: 9.2 nm (ref >=9.2)
HDL-C: 47 mg/dL (ref >39)
LDL (calc): 67 mg/dL (ref 0–99)
LDL PARTICLE NUMBER: 975 nmol/L (ref <1000)
LDL Size: 20.6 nm (ref >=20.8)
LP-IR Score: 45 (ref <=45)
Large HDL-P: 5.6 umol/L (ref >=4.8)
Large VLDL-P: 2.2 nmol/L (ref <=2.7)
SMALL LDL PARTICLE NUMBER: 606 nmol/L — AB (ref <=527)
Triglycerides: 115 mg/dL (ref 0–149)
VLDL SIZE: 45.1 nm (ref <=46.6)

## 2014-01-11 ENCOUNTER — Ambulatory Visit (INDEPENDENT_AMBULATORY_CARE_PROVIDER_SITE_OTHER): Payer: BC Managed Care – PPO | Admitting: Pharmacist

## 2014-01-11 DIAGNOSIS — E785 Hyperlipidemia, unspecified: Secondary | ICD-10-CM

## 2014-01-11 NOTE — Patient Instructions (Addendum)
Continue your current medications  You can start taking fish oil 2 gm every day.    Recheck labs in 6 months.

## 2014-01-11 NOTE — Progress Notes (Signed)
Patient here for followup to discuss his cholesterol given h/o elevated LDL-P and h/o CABG.  Patient has been working with the lipid clinic for years for management of his cholesterol.  His stress test was normal late 2013 showing no ischemia.   He has been traveling a lot over past several months.  He admits to eating worse on these trips.  He states he has had a little more alcohol to drink, but still drinks very little.  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  He tells me his lower back and right hip have been hurting him and so he has taken Tylenol on a more regular basis.    Cardiac risk factors CAD/CABG, HTN, age.  Target LDL < 70, LDL-P < 1000 nmol/L. Target non-HDL <100.   Medications currently taking: Crestor 20 mg qd, Benecol Chews bid, Metamucil 1-2 tablespoons bid.  Intolerance Lipitor 80 mg (myalgias). Stopped fish oil on own early 2013 due to report of possible prostate cancer with fish oil use.  Diet-  Worse for past few months as travelling a lot, and eating out.  However overall this is typically good low fat diet. 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  Exercise -bikes 5 days of the week. He is no longer walking due to pain in his knees.   Labs  (11/2013): LDL-P 975, LDL 67, TG 115, HDL 47, ALT normal  (06/2013):  LDL-P number 1149, small LDL-P 792, LDL 58, TG 108, HDL 50, LFTs normal (Crestor 20 mg qd, Benechol chews bid, metamucil bid, flax seed) (12/2012):  LDL-P number 918 nmol/L (goal < 1000), small LDL-P 574, LDL 56 mg/dL (goal < 70), non-HDL 74 mg/dL (goal < 100).  TG 92, HDL 48 (Crestor 20 mg qd, Benecol chews bid, metamucil bid, flax seed).   Liver enzymes improved over past 3 months.  ALT 41 (was 58), and AST 40 (was 41).  Current Outpatient Prescriptions  Medication Sig Dispense Refill  . Psyllium (METAMUCIL PO) Mix 4 tablespoons  with liquid once daily.    Marland Kitchen Ubiquinol 100 MG CAPS Take 100 mg by mouth daily.    Marland Kitchen acetaminophen (TYLENOL) 325 MG tablet Take 650 mg by mouth every 6 (six) hours as needed for pain.    Marland Kitchen Alum & Mag Hydroxide-Simeth (MAGIC MOUTHWASH) SOLN   1  . aspirin 325 MG tablet Take 325 mg by mouth daily.    . Cholecalciferol (VITAMIN D) 2000 UNITS tablet Take 2,000 Units by mouth daily.    Marland Kitchen esomeprazole (NEXIUM) 40 MG capsule Take 40 mg by mouth as needed.    . Flaxseed, Linseed, POWD Take 2 scoop by mouth as directed.    Marland Kitchen LORazepam (ATIVAN) 0.5 MG tablet Take 1 tablet by mouth at bedtime as needed. To help with sleep    . metoprolol succinate (TOPROL-XL) 25 MG 24 hr tablet Take 1 tablet (25 mg total) by mouth 2 (two) times daily. Take with or immediately following a meal. 180 tablet 3  . Multiple Vitamin (MULTIVITAMIN WITH MINERALS) TABS Take 1 tablet by mouth daily. Centrum silver    . nitroGLYCERIN (NITROSTAT) 0.4 MG SL tablet Place 1 tablet (0.4 mg total) under the tongue every 5 (five) minutes as needed for chest pain. 25 tablet 3  . nystatin (MYCOSTATIN) 100000 UNIT/ML suspension     . Plant Wellsite ge[employer] (  BENECOL PO) Take 2-3 tablets by mouth daily. 100mg -chews    . pramoxine-hydrocortisone (ANALPRAM HC) cream Apply topically at bedtime. 30 g 3  . rosuvastatin (CRESTOR) 20 MG tablet Take 1 tablet (20 mg total) by mouth daily. 90 tablet 3   No current facility-administered medications for this visit.   Allergies  Allergen Reactions  . Cephalexin     REACTION: tachycardia  . Levofloxacin     REACTION: tachycardia  . Lipitor [Atorvastatin]     Muscle aches on lipitor 80 mg daily  . Penicillins     REACTION: rash   Family History  Problem Relation Age of Onset  . Coronary artery disease Father   . Atrial fibrillation Mother 11  . Colon polyps Mother   . Prostate cancer Maternal Uncle

## 2014-01-11 NOTE — Assessment & Plan Note (Signed)
Pt's LDL-P improved from last visit despite traveling a lot over the past several months.  He is concerned that his TG have been trending up.  He did stop his fish oil last year which is likely the cause of this increase.  His non-HDL remains at goal of <100.  He will consider restarting fish oil.  Plan to recheck labs in 6 months.

## 2014-01-13 ENCOUNTER — Telehealth: Payer: Self-pay | Admitting: Interventional Cardiology

## 2014-01-13 NOTE — Telephone Encounter (Signed)
New message      Pt recently saw Gay Filler.  Talk to her.  He has questions.

## 2014-01-19 NOTE — Telephone Encounter (Signed)
Spoke with pt.  He has been doing physical therapy for his back.  He was given a cream to rub in his muscles that contains magnesium and he wanted to make sure that was okay.  Told him it would be fine.

## 2014-02-02 ENCOUNTER — Encounter: Payer: Self-pay | Admitting: Pharmacist

## 2014-02-03 ENCOUNTER — Other Ambulatory Visit: Payer: Self-pay

## 2014-02-03 DIAGNOSIS — M62830 Muscle spasm of back: Secondary | ICD-10-CM | POA: Diagnosis not present

## 2014-02-03 DIAGNOSIS — M5013 Cervical disc disorder with radiculopathy, cervicothoracic region: Secondary | ICD-10-CM | POA: Diagnosis not present

## 2014-02-03 MED ORDER — ROSUVASTATIN CALCIUM 20 MG PO TABS: 20.0000 mg | ORAL_TABLET | Freq: Every day | ORAL | Status: DC

## 2014-02-05 ENCOUNTER — Other Ambulatory Visit: Payer: Self-pay

## 2014-02-05 MED ORDER — METOPROLOL SUCCINATE ER 25 MG PO TB24: 25.0000 mg | ORAL_TABLET | Freq: Two times a day (BID) | ORAL | Status: DC

## 2014-02-05 MED ORDER — ROSUVASTATIN CALCIUM 20 MG PO TABS: 20.0000 mg | ORAL_TABLET | Freq: Every day | ORAL | Status: DC

## 2014-02-08 ENCOUNTER — Telehealth: Payer: Self-pay | Admitting: Interventional Cardiology

## 2014-02-08 DIAGNOSIS — I480 Paroxysmal atrial fibrillation: Secondary | ICD-10-CM

## 2014-02-08 MED ORDER — METOPROLOL TARTRATE 25 MG PO TABS: ORAL_TABLET | ORAL | Status: DC

## 2014-02-08 NOTE — Telephone Encounter (Signed)
New message     Talk to the nurse regarding his bp

## 2014-02-08 NOTE — Telephone Encounter (Signed)
I spoke with the patient. He states that on 01/23/14 he was up in [place] and had an episode of sweating/ lightheadedness. He checked his BP/ HR and he was 145/85 (80). His baseline BP/ HR is ~ 115-120/ 70 (65-75). Later that evening he was 171/101. He was doing well until 12/18. That morning his BP and HR were around his baseline. He worked out from ~ 10:30- 11:15 am. About an hour or so later, he had another episode of sweating/ lightheadedness. His BP/ HR were 150/90 (88). Later that day he was 131/85 (80). Over the weekend, his BP ranged from 113/60 (68) to 141/82 (72) on Saturday. On Sunday, he was 137/83 (83) that morning. He got on his bike about 15 minutes after taking his readings. He was on the bike ~ 3 minutes and his HR went to 130 bpm. He came off the bike and ~ 5 minutes later he was 136/84 (97). He usually takes toprol 25 mg BID. He took an extra dose about 6 pm last night for BP/ HR- 153/86 (85). He was down to 120/82 (80) about 10 pm last night. He has previously seen Dr. Rayann Heman and was given metoprolol tartrate 25 mg to take every 6 hours PRN for a-fib. He has a history of intermittent a-fib/ PVC's/ PAC's. I advised the patient that he may have had an episode of a-fib on the bike yesterday. I advised I would review with Dr. Rayann Heman and call him back. He is agreeable. Alvis Lemmings, RN, BSN  Reviewed the above with Dr. Rayann Heman. He states that the patient can have metoprolol tartrate 25 mg tablets to take PRN for his episodes. He should follow up with Dr. Irish Lack or the NP/ PA. I have made the patient aware that I will call in an updated RX for metoprolol tartrate to use as needed for his episodes. He asked if he could take an extra 1/2 tablet of metoprolol succ with either his AM/ PM doses of what he already takes. I advised that he could try that for a couple of days to see if this helps him. I advised that he should have the shorter acting metoprolol on hand as well. He will monitor his symptoms and  call if this does not help. I have advised him not to take his BP so frequently. Appointment made for 02/23/14 with Dr. Irish Lack. Alvis Lemmings, RN, BSN

## 2014-02-15 ENCOUNTER — Other Ambulatory Visit: Payer: Self-pay

## 2014-02-15 MED ORDER — METOPROLOL SUCCINATE ER 25 MG PO TB24: 25.0000 mg | ORAL_TABLET | Freq: Two times a day (BID) | ORAL | Status: DC

## 2014-02-23 ENCOUNTER — Encounter: Payer: Self-pay | Admitting: Interventional Cardiology

## 2014-02-23 ENCOUNTER — Ambulatory Visit (INDEPENDENT_AMBULATORY_CARE_PROVIDER_SITE_OTHER): Payer: Medicare Other | Admitting: Interventional Cardiology

## 2014-02-23 VITALS — BP 136/78 | HR 82 | Ht 68.0 in | Wt 175.0 lb

## 2014-02-23 DIAGNOSIS — R002 Palpitations: Secondary | ICD-10-CM

## 2014-02-23 DIAGNOSIS — I2510 Atherosclerotic heart disease of native coronary artery without angina pectoris: Secondary | ICD-10-CM | POA: Diagnosis not present

## 2014-02-23 DIAGNOSIS — I4891 Unspecified atrial fibrillation: Secondary | ICD-10-CM | POA: Diagnosis not present

## 2014-02-23 DIAGNOSIS — I10 Essential (primary) hypertension: Secondary | ICD-10-CM | POA: Diagnosis not present

## 2014-02-23 DIAGNOSIS — R61 Generalized hyperhidrosis: Secondary | ICD-10-CM | POA: Diagnosis not present

## 2014-02-23 DIAGNOSIS — IMO0001 Reserved for inherently not codable concepts without codable children: Secondary | ICD-10-CM

## 2014-02-23 NOTE — Patient Instructions (Signed)

## 2014-02-23 NOTE — Progress Notes (Signed)
Patient ID: DAE Noah [name], male   DOB: 04/29/1950, 64 y.o.   MRN: [id number]        [address], [place], [place]  [address] Phone: [phone] Fax:  [phone]  Date:  02/23/2014   ID:  Noah [name], DOB 09-10-1950, MRN [id number]  PCP:   Melinda Crutch, MD      History of Present Illness: Noah [name] is a 64 y.o. male who has had CAD. He had CABG  Several years ago.   He continues to exercise regularly. He does 30 minutes on the bike daily while in town.  He has had short runs of AFib in the past. Palpitations had stopped since he stopped fish oil due to prostate cancer concerns. BP has been controlled at home. CAD/ASCVD:  Denies : Chest pain.  Diaphoresis.  Dyspnea on exertion.  Leg edema.  Nitroglycerin.  Orthopnea.      He stopped fish oil due to perceived palpitations and his palpitations improved.  He also stopped due to a concern about prostate cancer. He has now read that there is no longer an association between fish oil and prostate cancer. Given his palpitations are better, he prefers to stay off of the 4 g per day fish oil. However, given that his triglycerides have trended up, he is concerned enough to perhaps try lower dose fish oil.  While in [place] a month ago, he had episodic spikes in his blood pressure. The highest was 171/102. He became very anxious. He started checking his blood pressure very frequently. He was checking multiple times a day. Some readings would be in the 90/60 range. Other readings would be in the 150s over 90 range. He also had some heart rates over 100. This occurred with exercise but he felt that happen faster than it should have. He was very nervous about this. He is aware that his bypass surgery is nearly 66 years old. He is at risk for graft failure. He would like to be seen today.      Wt Readings from Last 3 Encounters:  02/23/14 175 lb (79.379 kg)  01/04/14 173 lb 12.8 oz (78.835 kg)  07/10/13 168 lb  (76.204 kg)     Past Medical History  Diagnosis Date  . Coronary artery disease 07/13/2005    CABG  . Hyperlipemia   . PAC (premature atrial contraction)   . PVC (premature ventricular contraction)   . Anxiety   . Paroxysmal atrial fibrillation   . DJD (degenerative joint disease)   . DDD (degenerative disc disease)   . Arthritis   . Pneumonia   . Hemangioma of liver   . Elevated liver enzymes     Current Outpatient Prescriptions  Medication Sig Dispense Refill  . acetaminophen (TYLENOL) 325 MG tablet Take 650 mg by mouth every 6 (six) hours as needed for pain.    Marland Kitchen Alum & Mag Hydroxide-Simeth (MAGIC MOUTHWASH) SOLN   1  . aspirin 325 MG tablet Take 325 mg by mouth daily.    . Cholecalciferol (VITAMIN D) 2000 UNITS tablet Take 2,000 Units by mouth daily.    Marland Kitchen esomeprazole (NEXIUM) 40 MG capsule Take 40 mg by mouth as needed.    . Flaxseed, Linseed, POWD Take 2 scoop by mouth as directed.    Marland Kitchen LORazepam (ATIVAN) 0.5 MG tablet Take 1 tablet by mouth at bedtime as needed. To help with sleep    . metoprolol succinate (TOPROL-XL) 25 MG 24 hr  tablet Take 1 tablet (25 mg total) by mouth 2 (two) times daily. Take with or immediately following a meal. 180 tablet 3  . metoprolol tartrate (LOPRESSOR) 25 MG tablet Take one tablet by mouth every 6 hours as needed episodes of a-fib/ elevated BP 30 tablet 2  . Multiple Vitamin (MULTIVITAMIN WITH MINERALS) TABS Take 1 tablet by mouth daily. Centrum silver    . nitroGLYCERIN (NITROSTAT) 0.4 MG SL tablet Place 1 tablet (0.4 mg total) under the tongue every 5 (five) minutes as needed for chest pain. 25 tablet 3  . nystatin (MYCOSTATIN) 100000 UNIT/ML suspension     . Plant Stanol Ester (BENECOL PO) Take 2-3 tablets by mouth daily. 100mg -chews    . pramoxine-hydrocortisone (ANALPRAM HC) cream Apply topically at bedtime. 30 g 3  . Psyllium (METAMUCIL PO) Mix 4 tablespoons with liquid once daily.    . rosuvastatin (CRESTOR) 20 MG tablet Take 1 tablet  (20 mg total) by mouth daily. 90 tablet 3  . Ubiquinol 100 MG CAPS Take 100 mg by mouth daily.     No current facility-administered medications for this visit.    Allergies:    Allergies  Allergen Reactions  . Cephalexin     REACTION: tachycardia  . Levofloxacin     REACTION: tachycardia  . Lipitor [Atorvastatin]     Muscle aches on lipitor 80 mg daily  . Penicillins     REACTION: rash    Social History:  The patient  reports that he quit smoking about 40 years ago. His smoking use included Cigarettes. He smoked 0.00 packs per day. He has never used smokeless tobacco. He reports that he drinks alcohol. He reports that he does not use illicit drugs.   Family History:  The patient's family history includes Atrial fibrillation (age of onset: 35) in his mother; Colon polyps in his mother; Coronary artery disease in his father; Prostate cancer in his maternal uncle.   ROS:  Please see the history of present illness.  No nausea, vomiting.  No fevers, chills.  No focal weakness.  No dysuria. Occasional arm numbness/shaking. As above   All other systems reviewed and negative.   PHYSICAL EXAM: VS:  BP 136/78 mmHg  Pulse 82  Ht 5\' 8"  (1.727 m)  Wt 175 lb (79.379 kg)  BMI 26.61 kg/m2 Well nourished, well developed, in no acute distress HEENT: normal Neck: no JVD, no carotid bruits Cardiac:  normal S1, S2; RRR; no premature beats today Lungs:  clear to auscultation bilaterally, no wheezing, rhonchi or rales Abd: soft, nontender, no hepatomegaly Ext: no edema, 2+ PT pulses bilaterally Skin: warm and dry Neuro:   no focal abnormalities noted Psych: mildly anxious   ECG: NSR, no ST segment changes  ASSESSMENT AND PLAN:  HTN/palpitations:  Readings up to the 150s/90s range.  HR up quickly on the exercise bike.  Some sweating episodes and some anxiety with that.  Other readings are in the 90s/60s range.    He has had some PACs or PVCs. No recent palpitations.  Continue with current  medications. I don't want to add any more regular blood pressure lowering medicine because he may get hypotensive. Will allow him to take an additional metoprolol 25 mg tablet if his systolic is over 322 or if his heart rate is higher. He was given instructions like this a week ago over the phone. He has not had any problems in about 10 days. Will plan on keeping things the way they are at this  point. He is comfortable with that.  He is concerned about enlargement of his heart. Will check echocardiogram to evaluate given his palpitations, blood pressure swings and history of atrial fibrillation.   Coronary atherosclerosis of native coronary artery  Notes: No angina with exercise- does report that his exercise is not as vigorous as it used to be. He thinks this is age-related. s/p CABG several years ago.  Last stress test was negative for ischemia in September 2013.Continue aggressive secondary prevention. RF SL NTG. He has not used it recently, but just wants a fresh bottle.    . mixed hypercholesterolemia  Continue Crestor Tablet, 20 MG, 1 tablet, Orally  Once a day Notes: Lipids reviewed and improved. COntinue careful diet. High fiber.   His TG were trending up.  92 > 108 in 5/15.  He will start lower dose fish oil, 1000 mg daily after this next lipid check.  As above, it is noted that he feels better off of the fish oil.  He is quite annoyed that his numbers are trending up although he admits to some dietary indiscretion.  He has had several trips and it is harder to be disciplined out of town.    Afib  Continue Metoprolol Succinate Tablet Extended Release 24 Hour, 25 MG, 1 tablet, Orally, twice a day Continue Aspirin EC Tablet Delayed Release, 325 MG, 1 tablet, Orally, Once a day Notes: Low CHADS score. Would not use stronger anticoagulaton at this time. If palpitations get worse, would use LifeWatch monitor to evaluate. Rare PAF in the past.  Current symptoms do not feel like atrial fibrillation.  Heart rates have been low on his monitor. Less palpitations since he has been off of fish oil.       Carotid atherosclerosis: mild plaque bilaterally.  Medical therapy.  Scheduled for repeat Doppler in 6/16. Preventive Medicine  Adult topics discussed:  Diet: healthy diet.  Exercise: 5 days a week, at least 30 minutes of aerobic exercise.      Signed, Mina Marble, MD, [facility] 02/23/2014 11:59 AM

## 2014-02-26 ENCOUNTER — Ambulatory Visit (HOSPITAL_COMMUNITY): Payer: Medicare Other | Attending: Cardiovascular Disease | Admitting: Radiology

## 2014-02-26 DIAGNOSIS — I4891 Unspecified atrial fibrillation: Secondary | ICD-10-CM | POA: Diagnosis present

## 2014-02-26 NOTE — Progress Notes (Signed)
Echocardiogram performed.  

## 2014-03-01 DIAGNOSIS — M62830 Muscle spasm of back: Secondary | ICD-10-CM | POA: Diagnosis not present

## 2014-03-01 DIAGNOSIS — M5013 Cervical disc disorder with radiculopathy, cervicothoracic region: Secondary | ICD-10-CM | POA: Diagnosis not present

## 2014-03-09 DIAGNOSIS — L820 Inflamed seborrheic keratosis: Secondary | ICD-10-CM | POA: Diagnosis not present

## 2014-03-09 DIAGNOSIS — D2272 Melanocytic nevi of left lower limb, including hip: Secondary | ICD-10-CM | POA: Diagnosis not present

## 2014-03-09 DIAGNOSIS — D1801 Hemangioma of skin and subcutaneous tissue: Secondary | ICD-10-CM | POA: Diagnosis not present

## 2014-03-09 DIAGNOSIS — L218 Other seborrheic dermatitis: Secondary | ICD-10-CM | POA: Diagnosis not present

## 2014-03-09 DIAGNOSIS — D485 Neoplasm of uncertain behavior of skin: Secondary | ICD-10-CM | POA: Diagnosis not present

## 2014-03-10 DIAGNOSIS — F419 Anxiety disorder, unspecified: Secondary | ICD-10-CM | POA: Diagnosis not present

## 2014-03-10 DIAGNOSIS — Z23 Encounter for immunization: Secondary | ICD-10-CM | POA: Diagnosis not present

## 2014-03-10 DIAGNOSIS — I10 Essential (primary) hypertension: Secondary | ICD-10-CM | POA: Diagnosis not present

## 2014-04-12 DIAGNOSIS — M1711 Unilateral primary osteoarthritis, right knee: Secondary | ICD-10-CM | POA: Diagnosis not present

## 2014-04-19 DIAGNOSIS — M1711 Unilateral primary osteoarthritis, right knee: Secondary | ICD-10-CM | POA: Diagnosis not present

## 2014-04-26 DIAGNOSIS — M1711 Unilateral primary osteoarthritis, right knee: Secondary | ICD-10-CM | POA: Diagnosis not present

## 2014-05-18 ENCOUNTER — Other Ambulatory Visit: Payer: Self-pay | Admitting: Sports Medicine

## 2014-05-18 DIAGNOSIS — M25511 Pain in right shoulder: Secondary | ICD-10-CM

## 2014-05-18 DIAGNOSIS — M7581 Other shoulder lesions, right shoulder: Secondary | ICD-10-CM | POA: Diagnosis not present

## 2014-05-18 DIAGNOSIS — S139XXA Sprain of joints and ligaments of unspecified parts of neck, initial encounter: Secondary | ICD-10-CM | POA: Diagnosis not present

## 2014-05-18 DIAGNOSIS — M542 Cervicalgia: Secondary | ICD-10-CM

## 2014-06-08 ENCOUNTER — Ambulatory Visit
Admission: RE | Admit: 2014-06-08 | Discharge: 2014-06-08 | Disposition: A | Discharge status: Home / Self Care | Payer: Medicare Other | Source: Ambulatory Visit | Attending: Sports Medicine | Admitting: Sports Medicine

## 2014-06-08 ENCOUNTER — Telehealth: Payer: Self-pay | Admitting: Interventional Cardiology

## 2014-06-08 DIAGNOSIS — M542 Cervicalgia: Secondary | ICD-10-CM

## 2014-06-08 DIAGNOSIS — M7551 Bursitis of right shoulder: Secondary | ICD-10-CM | POA: Diagnosis not present

## 2014-06-08 DIAGNOSIS — M7581 Other shoulder lesions, right shoulder: Secondary | ICD-10-CM | POA: Diagnosis not present

## 2014-06-08 DIAGNOSIS — M25511 Pain in right shoulder: Secondary | ICD-10-CM

## 2014-06-08 NOTE — Telephone Encounter (Signed)
New message      Pt had open heart surgery in 2007.  Can he have an MRI today?

## 2014-06-08 NOTE — Telephone Encounter (Signed)
Pt states that he is having a MRI of cervical spine and right shoulder area. Informed pt that he is ok to continue with MRI. Pt verbalized understanding.

## 2014-06-10 DIAGNOSIS — S139XXD Sprain of joints and ligaments of unspecified parts of neck, subsequent encounter: Secondary | ICD-10-CM | POA: Diagnosis not present

## 2014-07-13 ENCOUNTER — Other Ambulatory Visit: Payer: BC Managed Care – PPO

## 2014-07-20 ENCOUNTER — Ambulatory Visit: Payer: BC Managed Care – PPO | Admitting: Pharmacist

## 2014-07-21 DIAGNOSIS — M5023 Other cervical disc displacement, cervicothoracic region: Secondary | ICD-10-CM | POA: Diagnosis not present

## 2014-07-21 DIAGNOSIS — M545 Low back pain: Secondary | ICD-10-CM | POA: Diagnosis not present

## 2014-07-28 ENCOUNTER — Other Ambulatory Visit (INDEPENDENT_AMBULATORY_CARE_PROVIDER_SITE_OTHER): Payer: Medicare Other | Admitting: *Deleted

## 2014-07-28 DIAGNOSIS — E785 Hyperlipidemia, unspecified: Secondary | ICD-10-CM

## 2014-07-28 LAB — HEPATIC FUNCTION PANEL
ALT: 31 U/L (ref 0–53)
AST: 26 U/L (ref 0–37)
Albumin: 4.4 g/dL (ref 3.5–5.2)
Alkaline Phosphatase: 75 U/L (ref 39–117)
Bilirubin, Direct: 0.2 mg/dL (ref 0.0–0.3)
Total Bilirubin: 0.8 mg/dL (ref 0.2–1.2)
Total Protein: 7.5 g/dL (ref 6.0–8.3)

## 2014-07-30 LAB — NMR LIPOPROFILE WITH LIPIDS
Cholesterol, Total: 124 mg/dL (ref 100–199)
HDL Particle Number: 34 umol/L (ref >=30.5)
HDL SIZE: 8.7 nm — AB (ref >=9.2)
HDL-C: 46 mg/dL (ref >39)
LARGE HDL: 3.5 umol/L — AB (ref >=4.8)
LARGE VLDL-P: 1.2 nmol/L (ref <=2.7)
LDL (calc): 56 mg/dL (ref 0–99)
LDL PARTICLE NUMBER: 888 nmol/L (ref <1000)
LDL SIZE: 20.3 nm (ref >=20.8)
LP-IR Score: 45 (ref <=45)
Small LDL Particle Number: 490 nmol/L (ref <=527)
TRIGLYCERIDES: 112 mg/dL (ref 0–149)
VLDL Size: 41.8 nm (ref <=46.6)

## 2014-08-02 ENCOUNTER — Telehealth: Payer: Self-pay | Admitting: Interventional Cardiology

## 2014-08-02 ENCOUNTER — Ambulatory Visit (INDEPENDENT_AMBULATORY_CARE_PROVIDER_SITE_OTHER): Payer: Medicare Other | Admitting: Pharmacist

## 2014-08-02 DIAGNOSIS — M545 Low back pain: Secondary | ICD-10-CM | POA: Diagnosis not present

## 2014-08-02 DIAGNOSIS — M5023 Other cervical disc displacement, cervicothoracic region: Secondary | ICD-10-CM | POA: Diagnosis not present

## 2014-08-02 DIAGNOSIS — E785 Hyperlipidemia, unspecified: Secondary | ICD-10-CM

## 2014-08-02 DIAGNOSIS — I2510 Atherosclerotic heart disease of native coronary artery without angina pectoris: Secondary | ICD-10-CM | POA: Diagnosis not present

## 2014-08-02 NOTE — Telephone Encounter (Signed)
Informed pt of lab results. Pt verbalized understanding. 

## 2014-08-02 NOTE — Progress Notes (Signed)
Patient here for followup to discuss his cholesterol given h/o elevated LDL-P and h/o CABG.  Patient has been working with the lipid clinic for years for management of his cholesterol.  His stress test was normal late 2013 showing no ischemia.    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  He is concerned because he has been unable to find anywhere to order Benechol chews lately.    Cardiac risk factors CAD/CABG, HTN, age.  Target LDL < 70, LDL-P < 1000 nmol/L. Target non-HDL <100.   Medications currently taking: Crestor 20 mg qd, Benecol Chews bid, Metamucil 1-2 tablespoons bid.  Intolerance Lipitor 80 mg (myalgias). Stopped fish oil on own early 2013 due to report of possible prostate cancer with fish oil use.  Labs  (07/2014): LDL-P 888, LDL 56, TG 112, HDL 46, LFTs normal  (11/2013): LDL-P 975, LDL 67, TG 115, HDL 47, ALT normal  (06/2013):  LDL-P number 1149, small LDL-P 792, LDL 58, TG 108, HDL 50, LFTs normal (Crestor 20 mg qd, Benechol chews bid, metamucil bid, flax seed) (12/2012):  LDL-P number 918 nmol/L (goal < 1000), small LDL-P 574, LDL 56 mg/dL (goal < 70), non-HDL 74 mg/dL (goal < 100).  TG 92, HDL 48 (Crestor 20 mg qd, Benecol chews bid, metamucil bid, flax seed).   Liver enzymes improved over past 3 months.  ALT 41 (was 58), and AST 40 (was 41).  Current Outpatient Prescriptions  Medication Sig Dispense Refill  . acetaminophen (TYLENOL) 325 MG tablet Take 650 mg by mouth every 6 (six) hours as needed for pain.    Marland Kitchen Alum & Mag Hydroxide-Simeth (MAGIC MOUTHWASH) SOLN   1  . aspirin 325 MG tablet Take 325 mg by mouth daily.    . Cholecalciferol (VITAMIN D) 2000 UNITS tablet Take 2,000 Units by mouth daily.    Marland Kitchen esomeprazole (NEXIUM) 40 MG capsule Take 40 mg by mouth as needed.    . Flaxseed, Linseed, POWD Take 2 scoop by mouth as directed.    Marland Kitchen LORazepam (ATIVAN) 0.5 MG tablet Take 1 tablet by mouth at bedtime as needed. To help with sleep    . metoprolol succinate (TOPROL-XL) 25 MG 24 hr  tablet Take 1 tablet (25 mg total) by mouth 2 (two) times daily. Take with or immediately following a meal. 180 tablet 3  . metoprolol tartrate (LOPRESSOR) 25 MG tablet Take one tablet by mouth every 6 hours as needed episodes of a-fib/ elevated BP 30 tablet 2  . Multiple Vitamin (MULTIVITAMIN WITH MINERALS) TABS Take 1 tablet by mouth daily. Centrum silver    . nitroGLYCERIN (NITROSTAT) 0.4 MG SL tablet Place 1 tablet (0.4 mg total) under the tongue every 5 (five) minutes as needed for chest pain. 25 tablet 3  . nystatin (MYCOSTATIN) 100000 UNIT/ML suspension     . Plant Stanol Ester (BENECOL PO) Take 2-3 tablets by mouth daily. 100mg -chews    . pramoxine-hydrocortisone (ANALPRAM HC) cream Apply topically at bedtime. 30 g 3  . Psyllium (METAMUCIL PO) Mix 4 tablespoons with liquid once daily.    . rosuvastatin (CRESTOR) 20 MG tablet Take 1 tablet (20 mg total) by mouth daily. 90 tablet 3  . Ubiquinol 100 MG CAPS Take 100 mg by mouth daily.     No current facility-administered medications for this visit.   Allergies  Allergen Reactions  . Cephalexin     REACTION: tachycardia  . Levofloxacin     REACTION: tachycardia  . Lipitor [Atorvastatin]  Muscle aches on lipitor 80 mg daily  . Penicillins     REACTION: rash   Family History  Problem Relation Age of Onset  . Coronary artery disease Father   . Atrial fibrillation Mother 64  . Colon polyps Mother   . Prostate cancer Maternal Uncle      Assessment and Plan 1.  Hyperlipidemia- Pt's LDL and LDL-P trending down over the past 12 months.  Pt states he is doing well.  Concern over supply of plant sterols.  Will see if there is another product available for substitution.  Will recheck labs in 6 months.  Pt has requested a CMET rather than hepatic panel to follow up on blood sugar.  Notes changed.

## 2014-08-02 NOTE — Telephone Encounter (Signed)
Follow Up       Pt returning Jennifer's phone call for lab results.

## 2014-08-02 NOTE — Patient Instructions (Signed)
Continue your current medication list.    You can stay off the fish oil.    I will see if there is any substitute for the Benical chews.  Recheck labs in 6 months.

## 2014-08-03 ENCOUNTER — Encounter: Payer: Self-pay | Admitting: Pharmacist

## 2014-08-09 DIAGNOSIS — M5023 Other cervical disc displacement, cervicothoracic region: Secondary | ICD-10-CM | POA: Diagnosis not present

## 2014-08-09 DIAGNOSIS — M545 Low back pain: Secondary | ICD-10-CM | POA: Diagnosis not present

## 2014-08-12 ENCOUNTER — Encounter: Payer: Self-pay | Admitting: Pharmacist

## 2014-08-12 ENCOUNTER — Encounter: Payer: Self-pay | Admitting: Interventional Cardiology

## 2014-09-20 ENCOUNTER — Encounter: Payer: Self-pay | Admitting: Pharmacist

## 2014-09-20 DIAGNOSIS — Z125 Encounter for screening for malignant neoplasm of prostate: Secondary | ICD-10-CM | POA: Diagnosis not present

## 2014-09-20 DIAGNOSIS — F419 Anxiety disorder, unspecified: Secondary | ICD-10-CM | POA: Diagnosis not present

## 2014-09-20 DIAGNOSIS — Z0000 Encounter for general adult medical examination without abnormal findings: Secondary | ICD-10-CM | POA: Diagnosis not present

## 2014-09-20 DIAGNOSIS — I2510 Atherosclerotic heart disease of native coronary artery without angina pectoris: Secondary | ICD-10-CM | POA: Diagnosis not present

## 2014-09-20 DIAGNOSIS — I10 Essential (primary) hypertension: Secondary | ICD-10-CM | POA: Diagnosis not present

## 2014-09-20 DIAGNOSIS — N529 Male erectile dysfunction, unspecified: Secondary | ICD-10-CM | POA: Diagnosis not present

## 2014-09-20 DIAGNOSIS — E782 Mixed hyperlipidemia: Secondary | ICD-10-CM | POA: Diagnosis not present

## 2014-10-05 ENCOUNTER — Encounter: Payer: Self-pay | Admitting: Interventional Cardiology

## 2014-10-05 ENCOUNTER — Ambulatory Visit (INDEPENDENT_AMBULATORY_CARE_PROVIDER_SITE_OTHER): Payer: Medicare Other | Admitting: Interventional Cardiology

## 2014-10-05 VITALS — BP 114/78 | HR 72 | Ht 68.0 in | Wt 174.0 lb

## 2014-10-05 DIAGNOSIS — I2510 Atherosclerotic heart disease of native coronary artery without angina pectoris: Secondary | ICD-10-CM

## 2014-10-05 DIAGNOSIS — R61 Generalized hyperhidrosis: Secondary | ICD-10-CM

## 2014-10-05 DIAGNOSIS — I10 Essential (primary) hypertension: Secondary | ICD-10-CM | POA: Diagnosis not present

## 2014-10-05 DIAGNOSIS — E782 Mixed hyperlipidemia: Secondary | ICD-10-CM

## 2014-10-05 DIAGNOSIS — IMO0001 Reserved for inherently not codable concepts without codable children: Secondary | ICD-10-CM

## 2014-10-05 DIAGNOSIS — R0602 Shortness of breath: Secondary | ICD-10-CM

## 2014-10-05 NOTE — Patient Instructions (Signed)
Medication Instructions:  Same-No changes  Labwork: None  Testing/Procedures: None  Follow-Up: Your physician wants you to follow-up in: 6 months. You will receive a reminder letter in the mail two months in advance. If you don't receive a letter, please call our office to schedule the follow-up appointment.

## 2014-10-05 NOTE — Progress Notes (Signed)
Patient ID: Noah [name], male   DOB: 1950-11-28, 64 y.o.   MRN: [id number]     Cardiology Office Note   Date:  10/05/2014   ID:  Noah [name], DOB [birth year], MRN [id number]  PCP:   Melinda Crutch, MD    No chief complaint on file.  F/u CAD  Wt Readings from Last 3 Encounters:  10/05/14 174 lb (78.926 kg)  02/23/14 175 lb (79.379 kg)  01/04/14 173 lb 12.8 oz (78.835 kg)       History of Present Illness: Noah [name] is a 64 y.o. male  who has had CAD. He had CABG in 2007.   He continues to exercise regularly. He does 30 minutes on the bike daily while in town.  He has had short runs of AFib in the past. Palpitations had stopped since he stopped fish oil due to prostate cancer concerns. BP has been controlled at home.  He has rare PACs and PVCs. CAD/ASCVD:  Denies : Chest pain.  Dyspnea on exertion.  Leg edema.  Nitroglycerin.  Orthopnea.    he does report episodes of intermittent sweating. They're not related to activity. He typically occur after eating. He has also had some shortness of breath only after eating. He rides his bike typically 5 days a week and has no trouble with shortness of breath. He has had acid reflux type symptoms. They have come all the way up to his throat. He has had trouble speaking after feeling a burst of acid go up into his throat.   He is also concerned as to whether he could be having small seizures. There is a history of epilepsy in his family. He is concerned that the sweating episodes may be neurologically related.  Past Medical History  Diagnosis Date  . Coronary artery disease 07/13/2005    CABG  . Hyperlipemia   . PAC (premature atrial contraction)   . PVC (premature ventricular contraction)   . Anxiety   . Paroxysmal atrial fibrillation   . DJD (degenerative joint disease)   . DDD (degenerative disc disease)   . Arthritis   . Pneumonia   . Hemangioma of liver   . Elevated liver enzymes     Past Surgical History    Procedure Laterality Date  . Coronary artery bypass graft  07/13/2005  . Knee surgery Right   . Inguinal hernia repair Right 1978, 2000    x 2     Current Outpatient Prescriptions  Medication Sig Dispense Refill  . acetaminophen (TYLENOL) 325 MG tablet Take 650 mg by mouth every 6 (six) hours as needed for pain.    Marland Kitchen aspirin 325 MG tablet Take 325 mg by mouth daily.    . Cholecalciferol (VITAMIN D) 2000 UNITS tablet Take 2,000 Units by mouth daily.    . Flaxseed, Linseed, POWD Take 2 scoop by mouth as directed.    Marland Kitchen LORazepam (ATIVAN) 0.5 MG tablet Take 1 tablet by mouth at bedtime as needed. To help with sleep    . metoprolol succinate (TOPROL-XL) 25 MG 24 hr tablet Take 1 tablet (25 mg total) by mouth 2 (two) times daily. Take with or immediately following a meal. 180 tablet 3  . metoprolol tartrate (LOPRESSOR) 25 MG tablet Take one tablet by mouth every 6 hours as needed episodes of a-fib/ elevated BP 30 tablet 2  . Multiple Vitamin (MULTIVITAMIN WITH MINERALS) TABS Take 1 tablet by mouth daily. Centrum silver    . nitroGLYCERIN (NITROSTAT) 0.4 MG  SL tablet Place 1 tablet (0.4 mg total) under the tongue every 5 (five) minutes as needed for chest pain. 25 tablet 3  . NON FORMULARY Nystatin/diphenhist -  SWISH AND SWALLOW 5 MLS BY MOUTH 4-5 TIMES DAILY    . pramoxine-hydrocortisone (ANALPRAM HC) cream Apply 1 application topically at bedtime as needed (skin).    . Psyllium (METAMUCIL PO) Mix 4 tablespoons with liquid once daily.    . rosuvastatin (CRESTOR) 20 MG tablet Take 1 tablet (20 mg total) by mouth daily. 90 tablet 3  . Ubiquinol 100 MG CAPS Take 100 mg by mouth daily.    . Plant Stanol Ester (BENECOL PO) Take 2-3 tablets by mouth daily. 100mg -chews     No current facility-administered medications for this visit.    Allergies:   Cephalexin; Levofloxacin; Lipitor; and Penicillins    Social History:  The patient  reports that he quit smoking about 40 years ago. His smoking use  included Cigarettes. He has never used smokeless tobacco. He reports that he drinks alcohol. He reports that he does not use illicit drugs.   Family History:  The patient's family history includes Atrial fibrillation (age of onset: 75) in his mother; Colon polyps in his mother; Coronary artery disease in his father; Prostate cancer in his maternal uncle; Seizures in his brother.    ROS:  Please see the history of present illness.   Otherwise, review of systems are positive for  Heartburn, sweating , shortness of breath.   All other systems are reviewed and negative.    PHYSICAL EXAM: VS:  BP 114/78 mmHg  Pulse 72  Ht 5\' 8"  (1.727 m)  Wt 174 lb (78.926 kg)  BMI 26.46 kg/m2 , BMI Body mass index is 26.46 kg/(m^2). GEN: Well nourished, well developed, in no acute distress HEENT: normal Neck: no JVD, carotid bruits, or masses Cardiac: RRR; no murmurs, rubs, or gallops,no edema  Respiratory:  clear to auscultation bilaterally, normal work of breathing GI: soft, nontender, nondistended, + BS MS: no deformity or atrophy Skin: warm and dry, no rash Neuro:  Strength and sensation are intact Psych: euthymic mood, full affect   Recent Labs: 01/05/2014: BUN 16; Creatinine, Ser 1.1; Potassium 4.5; Sodium 140 07/28/2014: ALT 31   Lipid Panel    Component Value Date/Time   CHOL 124 07/28/2014 0903   TRIG 112 07/28/2014 0903   HDL 46 07/28/2014 0903   LDLCALC 56 07/28/2014 0903     Other studies Reviewed: Additional studies/ records that were reviewed today with results demonstrating:  Echocardiogram in 2016 showed normal left  ventricular function with mild aortic insufficiency.   ASSESSMENT AND PLAN:  1.  CAD: No angina. No exercise induced symptoms. I don't think the breathlessness that he feels after eating is related to his heart. It may be more related to acid reflux. He has no symptoms with vigorous bike riding. 2.  hyperlipidemia: Continue Crestor. He had a very good lipid profile  a few months ago. Follow-up in the lipid clinic as well. 3.  sweating: I don't think this is cardiac related. This also is not related to exertion. 4.  Hypertension: Blood pressure well controlled. Continue current medicines. No recent spikes in his blood pressure. 5.  In general, he does get anxious about his symptoms. He admits this and this may be contributing somewhat.   He was given Nexium but felt worse after this. I gave him the name for protonix to see if this may work better for him.  Current medicines are reviewed at length with the patient today.  The patient concerns regarding his medicines were addressed.  The following changes have been made:  No change  Labs/ tests ordered today include:  No orders of the defined types were placed in this encounter.    Recommend 150 minutes/week of aerobic exercise Low fat, low carb, high fiber diet recommended  Disposition:   FU in 6 months   Teresita Madura., MD  10/05/2014 4:30 PM    [facility] [address], [place], [place]  [address] Phone: [phone]; Fax: [phone]

## 2014-10-29 DIAGNOSIS — M7582 Other shoulder lesions, left shoulder: Secondary | ICD-10-CM | POA: Diagnosis not present

## 2014-11-08 DIAGNOSIS — J019 Acute sinusitis, unspecified: Secondary | ICD-10-CM | POA: Diagnosis not present

## 2014-12-01 DIAGNOSIS — Z23 Encounter for immunization: Secondary | ICD-10-CM | POA: Diagnosis not present

## 2014-12-06 ENCOUNTER — Telehealth: Payer: Self-pay | Admitting: Interventional Cardiology

## 2014-12-06 MED ORDER — CRESTOR 20 MG PO TABS: 20.0000 mg | ORAL_TABLET | Freq: Every day | ORAL | Status: DC

## 2014-12-06 NOTE — Telephone Encounter (Signed)
**Note De-Identified Noah [name] Obfuscation** Crestor 20 mg # 90 with 2 refills sent to [facility] with message to pharmacist "brand name only".

## 2014-12-06 NOTE — Telephone Encounter (Signed)
New Message      Calling stating that the pt needs a new prescription for Crestor 20 mg stating No substitution allowed to [facility] mail order pharmacy.

## 2014-12-17 DIAGNOSIS — L821 Other seborrheic keratosis: Secondary | ICD-10-CM | POA: Diagnosis not present

## 2014-12-17 DIAGNOSIS — L820 Inflamed seborrheic keratosis: Secondary | ICD-10-CM | POA: Diagnosis not present

## 2015-01-27 ENCOUNTER — Other Ambulatory Visit: Payer: Medicare Other

## 2015-02-01 ENCOUNTER — Other Ambulatory Visit: Payer: Self-pay | Admitting: Interventional Cardiology

## 2015-02-01 ENCOUNTER — Other Ambulatory Visit (INDEPENDENT_AMBULATORY_CARE_PROVIDER_SITE_OTHER): Payer: Medicare Other | Admitting: *Deleted

## 2015-02-01 ENCOUNTER — Encounter: Payer: Self-pay | Admitting: Pharmacist

## 2015-02-01 ENCOUNTER — Other Ambulatory Visit: Payer: Self-pay | Admitting: Pharmacist

## 2015-02-01 DIAGNOSIS — E785 Hyperlipidemia, unspecified: Secondary | ICD-10-CM

## 2015-02-01 DIAGNOSIS — H35371 Puckering of macula, right eye: Secondary | ICD-10-CM | POA: Diagnosis not present

## 2015-02-01 LAB — BASIC METABOLIC PANEL
BUN: 14 mg/dL (ref 7–25)
CO2: 19 mmol/L — ABNORMAL LOW (ref 20–31)
Calcium: 9.7 mg/dL (ref 8.6–10.3)
Chloride: 103 mmol/L (ref 98–110)
Creat: 1.02 mg/dL (ref 0.70–1.25)
GLUCOSE: 87 mg/dL (ref 65–99)
Potassium: 4.1 mmol/L (ref 3.5–5.3)
SODIUM: 138 mmol/L (ref 135–146)

## 2015-02-02 ENCOUNTER — Ambulatory Visit: Payer: Medicare Other | Admitting: Pharmacist

## 2015-02-04 ENCOUNTER — Encounter: Payer: Self-pay | Admitting: Pharmacist

## 2015-02-04 ENCOUNTER — Ambulatory Visit: Payer: Medicare Other | Admitting: Pharmacist

## 2015-02-05 LAB — CARDIO IQ(R) ADVANCED LIPID PANEL
Apolipoprotein B: 69 mg/dL (ref 52–109)
CHOLESTEROL, TOTAL (CARDIO IQ ADV LIPID PANEL): 130 mg/dL (ref 125–200)
Cholesterol/HDL Ratio: 2.7 calc (ref <=5.0)
HDL CHOLESTEROL (CARDIO IQ ADV LIPID PANEL): 48 mg/dL (ref >=40)
LDL CHOLESTEROL CALCULATED (CARDIO IQ ADV LIPID PANEL): 55 mg/dL
LDL Large: 5774 nmol/L (ref 4334–10815)
LDL MEDIUM: 195 nmol/L (ref 167–465)
LDL PARTICLE NUMBER: 1068 nmol/L (ref 1016–2185)
LDL Peak Size: 212 Angstrom — ABNORMAL LOW (ref >=218.2)
LDL Small: 210 nmol/L (ref 123–441)
Lipoprotein (a): <10 nmol/L (ref <75)
NON-HDL CHOLESTEROL (CARDIO IQ ADV LIPID PANEL): 82 mg/dL
Triglycerides: 136 mg/dL

## 2015-02-05 LAB — AST: AST: 37 U/L — ABNORMAL HIGH (ref 10–35)

## 2015-02-05 LAB — ALT: ALT: 32 U/L (ref 9–46)

## 2015-02-07 ENCOUNTER — Ambulatory Visit (INDEPENDENT_AMBULATORY_CARE_PROVIDER_SITE_OTHER): Payer: Medicare Other | Admitting: Pharmacist

## 2015-02-07 DIAGNOSIS — E782 Mixed hyperlipidemia: Secondary | ICD-10-CM | POA: Diagnosis not present

## 2015-02-07 DIAGNOSIS — I2510 Atherosclerotic heart disease of native coronary artery without angina pectoris: Secondary | ICD-10-CM

## 2015-02-07 NOTE — Progress Notes (Signed)
HPI Noah [name] is a 64 yo patient of Dr. Irish [name] who is followed in the [facility] for his h/o elevated LDL-P and h/o CABG.  Patient has been working with the lipid clinic for years for management of his cholesterol.  His stress test was normal late 2013 showing no ischemia.    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  He is complaint with all of his medications and no side effect issues.  He is unable to take benecol due to the company stopping production.     Cardiac risk factors CAD/CABG, HTN, age.  Target LDL < 70, LDL-P < 1000 nmol/L. Target non-HDL <100.   Medications currently taking: Crestor 20 mg qd, Metamucil 1-2 tablespoons bid.  Intolerance Lipitor 80 mg (myalgias). Stopped fish oil on own early 2013 due to report of possible prostate cancer with fish oil use.  Labs  (01/2015): LDL-P 1068, LDL 55, TG 136, HDL 48, Lp(a) <10, ApoB- 69, LFTs stable (07/2014): LDL-P 888, LDL 56, TG 112, HDL 46, LFTs normal  (11/2013): LDL-P 975, LDL 67, TG 115, HDL 47, ALT normal  (06/2013):  LDL-P number 1149, small LDL-P 792, LDL 58, TG 108, HDL 50, LFTs normal (Crestor 20 mg qd, Benechol chews bid, metamucil bid, flax seed) (12/2012):  LDL-P number 918 nmol/L (goal < 1000), small LDL-P 574, LDL 56 mg/dL (goal < 70), non-HDL 74 mg/dL (goal < 100).  TG 92, HDL 48 (Crestor 20 mg qd, Benecol chews bid, metamucil bid, flax seed).    Current Outpatient Prescriptions  Medication Sig Dispense Refill  . acetaminophen (TYLENOL) 325 MG tablet Take 650 mg by mouth every 6 (six) hours as needed for pain.    Marland Kitchen aspirin 325 MG tablet Take 325 mg by mouth daily.    . Cholecalciferol (VITAMIN D) 2000 UNITS tablet Take 2,000 Units by mouth daily.    . CRESTOR 20 MG tablet Take 1 tablet (20 mg total) by mouth daily. 90 tablet 2  . Flaxseed, Linseed, POWD Take 2 scoop by mouth as directed.    Marland Kitchen LORazepam (ATIVAN) 0.5 MG tablet Take 1 tablet by mouth at bedtime as needed. To help with sleep    . metoprolol succinate  (TOPROL-XL) 25 MG 24 hr tablet Take 1 tablet (25 mg total) by mouth 2 (two) times daily. Take with or immediately following a meal. 180 tablet 3  . metoprolol tartrate (LOPRESSOR) 25 MG tablet Take one tablet by mouth every 6 hours as needed episodes of a-fib/ elevated BP 30 tablet 2  . Multiple Vitamin (MULTIVITAMIN WITH MINERALS) TABS Take 1 tablet by mouth daily. Centrum silver    . nitroGLYCERIN (NITROSTAT) 0.4 MG SL tablet Place 1 tablet (0.4 mg total) under the tongue every 5 (five) minutes as needed for chest pain. 25 tablet 3  . NON FORMULARY Nystatin/diphenhist -  SWISH AND SWALLOW 5 MLS BY MOUTH 4-5 TIMES DAILY    . Plant Stanol Ester (BENECOL PO) Take 2-3 tablets by mouth daily. 100mg -chews    . pramoxine-hydrocortisone (ANALPRAM HC) cream Apply 1 application topically at bedtime as needed (skin).    . Psyllium (METAMUCIL PO) Mix 4 tablespoons with liquid once daily.    Marland Kitchen Ubiquinol 100 MG CAPS Take 100 mg by mouth daily.     No current facility-administered medications for this visit.   Allergies  Allergen Reactions  . Cephalexin     REACTION: tachycardia  . Levofloxacin     REACTION: tachycardia  . Lipitor [Atorvastatin]  Muscle aches on lipitor 80 mg daily  . Penicillins     REACTION: rash   Family History  Problem Relation Age of Onset  . Coronary artery disease Father   . Atrial fibrillation Mother 36  . Colon polyps Mother   . Prostate cancer Maternal Uncle   . Seizures Brother      Assessment and Plan 1.  Hyperlipidemia- Pt's LDL and LDL-P stable.  There was a slight variability since last visit due to changing from NMR to [place] test.  Had long discussion with patient on difference between the 2 tests and role of LDL-P, particle size, Lp(a) and ApoB in assessing his overall risk.  All of these were at goal.  Pt concerned over particle size pattern B.  This may improve once able to take Benacol again.  Product will be available next fall.  Until then, continue  current medications and recheck labs in 6 months.

## 2015-02-07 NOTE — Patient Instructions (Signed)
Continue your current medications.   Recheck labs in 6 months.

## 2015-02-24 DIAGNOSIS — H01009 Unspecified blepharitis unspecified eye, unspecified eyelid: Secondary | ICD-10-CM | POA: Diagnosis not present

## 2015-03-02 ENCOUNTER — Encounter: Payer: Self-pay | Admitting: Interventional Cardiology

## 2015-03-07 ENCOUNTER — Telehealth: Payer: Self-pay

## 2015-03-07 ENCOUNTER — Other Ambulatory Visit: Payer: Self-pay

## 2015-03-07 DIAGNOSIS — I480 Paroxysmal atrial fibrillation: Secondary | ICD-10-CM

## 2015-03-07 MED ORDER — METOPROLOL TARTRATE 25 MG PO TABS: ORAL_TABLET | ORAL | Status: DC

## 2015-03-07 NOTE — Telephone Encounter (Signed)
**Note De-identified Noah [name] Obfuscation** LMTCB

## 2015-03-07 NOTE — Telephone Encounter (Signed)
Last Read in MyChart   03/07/2015 12:05 AM by [name]        Will forward your message to my nurse. We will get your preferences to the pharmacy. Let me know if you don't get the correct medicines. We had a nice holiday. Hope you and your family did as well.    JV            Previous Messages     ----- Message -----   From: [name]   Sent: 03/02/2015 2:12 PM EST    To: [name], MD  Subject: Non-Urgent Medical Question   hi Doc...Marland KitchenMarland KitchenHope you and your family had a great holiday season and [place]. Two items have come up with my prescription service [facility] you need to attend to on my behalf.    1. I'll need a new refill prescription sent in for my metroplol succinate, 25g twice day and please indicate that the mfg must be '[name]'. Also, that I want it on auto-refill.  Now I have an appt in Feb so I have more than enough until then so that can wait until we meet. now If you do send this in now, make sure that they do not ship me another prescription. Ive found them to a very anal company and just blindly do things.    2.More importantly,I need you to initiate a letter to [place] requesting that I receive the "brand" type Crestor prescription in the future and NOT the generic equivalent Please Indicate I am allergic to statin generics and get quite ill when I've taken them so that it is approved. Plus I want the lowest generic pricing applied as well.tks, [facility]          Non-Urgent Medical Question     From   Noah [name]    To   [name], MD    Sent   03/02/2015 2:12 PM       hi Doc...Marland KitchenMarland KitchenHope you and your family had a great holiday season and [place]. Two items have come up with my prescription [facility] you need to attend to on my behalf.    1. I'll need a new refill prescription sent in for my metroplol succinate, 25g twice day and  please indicate that the mfg must be '[name]'. Also, that I want it on auto-refill.  Now I have an appt in Feb so I have more than enough until then so that can wait until we meet. now If you do send this in now, make sure that they do not ship me another prescription. Ive found them to a very anal company and just blindly do things.    2.More importantly,I need you to initiate a letter to [place] requesting that I receive the "brand" type Crestor prescription in the future and NOT the generic equivalent Please Indicate I am allergic to statin generics and get quite ill when I've taken them so that it is approved. Plus I want the lowest generic pricing applied as well.tks, Noah [name]

## 2015-03-10 NOTE — Telephone Encounter (Signed)
Follow up ° ° ° ° ° °Returning a call to the nurse °

## 2015-03-11 ENCOUNTER — Telehealth: Payer: Self-pay

## 2015-03-11 NOTE — Telephone Encounter (Signed)
Prior auth for brand Crestor 20mg  sent to [employer].

## 2015-03-11 NOTE — Telephone Encounter (Signed)
The pt states that he has already spoke with Jenean Lindau, LPN on the phone yesterday and was advised that she is handling his request for [employer]. I spoke with Vaughan Basta this morning and she confirmed that she is working on this.  The pt is requesting a call back from [place] once she gets response from [employer].  Will forward message to [place].

## 2015-03-14 ENCOUNTER — Telehealth: Payer: Self-pay | Admitting: Interventional Cardiology

## 2015-03-14 ENCOUNTER — Telehealth: Payer: Self-pay

## 2015-03-14 ENCOUNTER — Ambulatory Visit (INDEPENDENT_AMBULATORY_CARE_PROVIDER_SITE_OTHER): Payer: Medicare Other | Admitting: *Deleted

## 2015-03-14 VITALS — BP 142/84 | HR 78 | Wt 183.0 lb

## 2015-03-14 DIAGNOSIS — R42 Dizziness and giddiness: Secondary | ICD-10-CM | POA: Diagnosis not present

## 2015-03-14 DIAGNOSIS — R531 Weakness: Secondary | ICD-10-CM

## 2015-03-14 NOTE — Telephone Encounter (Signed)
Today pt took bp before exercise highest 140/90 p 76 but following were 122/75 range p 60's During exercise he started to feel dizzy and c/o leg weakness. He took his metoprolol succ at 11:00 and ate breakfast but continues to feel dizzy and "off", his speech is normal.  Denies SOB, CP,HA. I discussed with Truitt Merle NP she advised seeing PCP and will see further if needed. Pt called back before I could call him and gave me additional BP readings 157/93 p94, 149/92 p93, 156/89 p92 I told pt that it was recommended he see PCP first. Pt wants an EKG. I spoke with Truitt Merle Np and she said we could do that but he does not need to be seen at this time. Nurse visit was scheduled for today at 1400. Pt was informed and was excepting of plan. I will forward to Dr/nurse to update

## 2015-03-14 NOTE — Progress Notes (Signed)
Pt here for EKG after calling in earlier today with dizziness. Pt reports episode today as outlined in phone note. He is no longer having dizziness. Still feels "not right" but no specific complaints. Thinks he may be tired. Vision has returned to normal.  Has been treated recently for redness and possible infection in left eye.  He reports this is improving. Finished treatment for this on 03/09/15. EKG reviewed by Truitt Merle, NP. No new recommendations. Pt is seeing primary care next week and Dr. Irish Lack on 2/14.  I asked him to continue to monitor blood pressure and bring readings to these office visits.

## 2015-03-14 NOTE — Telephone Encounter (Signed)
New problem    Pt woke up and after exercise he was dizzy and shaky and need to speak to nurse to possibly come in today.

## 2015-03-14 NOTE — Telephone Encounter (Signed)
Crestor, name brand is already covered by [employer].

## 2015-03-15 NOTE — Telephone Encounter (Signed)
I reviewed his ECG. It was normal.  How is he feeling

## 2015-03-16 NOTE — Telephone Encounter (Signed)
Spoke with pt and he states that yesterday he felt fine and worked out on the bike with no issues. Pt states that late last night he started having the same feeling he has had in the past and was told that these were PVC's and PAC's. Pt states that he did not work out today due to feeling "off" with these extra beats. Pt took Metoprolol Succinate and Alprazolam at about 11AM and said a little while after taking meds his sx's subsided and he feels fine at this time. Pt denies any dizziness or lightheadedness. States HR never got over 90 today. Will forward to Dr. Irish Lack for review and advisement.  Pt has appt on 2/14 with Dr. Irish Lack.

## 2015-03-21 ENCOUNTER — Encounter: Payer: Self-pay | Admitting: Pharmacist

## 2015-03-22 DIAGNOSIS — H578 Other specified disorders of eye and adnexa: Secondary | ICD-10-CM | POA: Diagnosis not present

## 2015-03-22 DIAGNOSIS — F419 Anxiety disorder, unspecified: Secondary | ICD-10-CM | POA: Diagnosis not present

## 2015-03-22 DIAGNOSIS — H938X9 Other specified disorders of ear, unspecified ear: Secondary | ICD-10-CM | POA: Diagnosis not present

## 2015-03-23 ENCOUNTER — Telehealth: Payer: Self-pay

## 2015-03-23 NOTE — Telephone Encounter (Signed)
Request for Tier Reduction of Crestor approved by [employer]. Patient has received this same letter.

## 2015-03-25 DIAGNOSIS — H43391 Other vitreous opacities, right eye: Secondary | ICD-10-CM | POA: Diagnosis not present

## 2015-03-25 DIAGNOSIS — H43813 Vitreous degeneration, bilateral: Secondary | ICD-10-CM | POA: Diagnosis not present

## 2015-03-25 DIAGNOSIS — H35371 Puckering of macula, right eye: Secondary | ICD-10-CM | POA: Diagnosis not present

## 2015-04-04 NOTE — Progress Notes (Signed)
Patient ID: Noah [name], male   DOB: [birth year], 65 y.o.   MRN: [id number]     Cardiology Office Note   Date:  04/05/2015   ID:  Noah [name], DOB 1951/01/06, MRN [id number]  PCP:   Noah Crutch, MD    Chief Complaint  Patient presents with  . Follow-up    no sx  f/u CAD   Wt Readings from Last 3 Encounters:  04/05/15 177 lb 9.6 oz (80.559 kg)  03/14/15 183 lb (83.008 kg)  10/05/14 174 lb (78.926 kg)       History of Present Illness: Noah [name] is a 65 y.o. male  who has had CAD. He had CABG in 2007.   He continues to exercise regularly. He does 30 minutes on the bike daily while in town.  He has had short runs of AFib in the past. Palpitations had stopped since he stopped fish oil due to prostate cancer concerns. BP has been controlled at home. He has occasional PACs and PVCs. CAD/ASCVD:  Denies : Chest pain.  Dyspnea on exertion.  Leg edema.  Nitroglycerin.  Orthopnea.   In August 2016, he reported episodes of intermittent sweating. They were not related to activity. They typically occur after eating. He had some shortness of breath only after eating. He has had acid reflux type symptoms. They have come all the way up to his throat. He has had trouble speaking after feeling a burst of acid go up into his throat.  He was also concerned as to whether he could be having small seizures. There is a history of epilepsy in his family. He is concerned that the sweating episodes may be neurologically related.  He rides his bike typically 5 days a week and has no trouble with shortness of breath.   The sweating episodes resolved.   In January 2017, he had a severe left eye infection.  THis cleared after drops and then oral doxycycline and valacyclovir.  Sx improved.  The appearance of his left eye improved. He had use a cream on the left eye as well.   One day in Jan 2017, he had dizziness with exercise, while he was on the oral antibiotics.  His BP was  increased.  He had some palpitations.  He thought it could have been palpitations.  His BP was elevated. He had an ECG it was normal.  His sx resolved as well.  He is back to exercising.  He is going to [place] next week.    Past Medical History  Diagnosis Date  . Coronary artery disease 07/13/2005    CABG  . Hyperlipemia   . PAC (premature atrial contraction)   . PVC (premature ventricular contraction)   . Anxiety   . Paroxysmal atrial fibrillation (HCC)   . DJD (degenerative joint disease)   . DDD (degenerative disc disease)   . Arthritis   . Pneumonia   . Hemangioma of liver   . Elevated liver enzymes     Past Surgical History  Procedure Laterality Date  . Coronary artery bypass graft  07/13/2005  . Knee surgery Right   . Inguinal hernia repair Right 1978, 2000    x 2     Current Outpatient Prescriptions  Medication Sig Dispense Refill  . acetaminophen (TYLENOL) 325 MG tablet Take 650 mg by mouth every 6 (six) hours as needed for pain.    Marland Kitchen aspirin 325 MG tablet Take 325 mg by mouth daily.    Marland Kitchen  Cholecalciferol (VITAMIN D) 2000 UNITS tablet Take 2,000 Units by mouth daily.    . CRESTOR 20 MG tablet Take 1 tablet (20 mg total) by mouth daily. 90 tablet 2  . Flaxseed, Linseed, POWD Take 2 scoop by mouth as directed.    Marland Kitchen LORazepam (ATIVAN) 0.5 MG tablet Take 1 tablet by mouth at bedtime as needed for sleep. To help with sleep    . metoprolol succinate (TOPROL-XL) 25 MG 24 hr tablet Take 25 mg by mouth daily. Use "[name]" manufacturing only    . metoprolol tartrate (LOPRESSOR) 25 MG tablet Take one tablet by mouth every 6 hours as needed episodes of a-fib/ elevated BP.  Manufacturing company "[name]" 30 tablet 2  . Multiple Vitamin (MULTIVITAMIN WITH MINERALS) TABS Take 1 tablet by mouth daily. Centrum silver    . nitroGLYCERIN (NITROSTAT) 0.4 MG SL tablet Place 1 tablet (0.4 mg total) under the tongue every 5 (five) minutes as needed for chest pain. 25 tablet 3  . NON FORMULARY  Nystatin/diphenhist -  SWISH AND SWALLOW 5 MLS BY MOUTH 4-5 TIMES DAILY    . Plant Stanol Ester (BENECOL PO) Take 200 mg by mouth daily.     . pramoxine-hydrocortisone (ANALPRAM HC) cream Apply 1 application topically at bedtime as needed (for skin).     . Psyllium (METAMUCIL PO) 4 tablespoons by mouth daily as needed, for consitpation    . Ubiquinol 100 MG CAPS Take 100 mg by mouth daily.     No current facility-administered medications for this visit.    Allergies:   Cephalexin; Levofloxacin; Lipitor; and Penicillins    Social History:  The patient  reports that he quit smoking about 41 years ago. His smoking use included Cigarettes. He has never used smokeless tobacco. He reports that he drinks alcohol. He reports that he does not use illicit drugs.   Family History:  The patient's family history includes Atrial fibrillation (age of onset: 70) in his mother; Colon polyps in his mother; Coronary artery disease in his father; Prostate cancer in his maternal uncle; Seizures in his brother.    ROS:  Please see the history of present illness.   Otherwise, review of systems are positive for dizziness as noted above; weight gain.   All other systems are reviewed and negative.    PHYSICAL EXAM: VS:  BP 110/62 mmHg  Pulse 75  Ht 5\' 8"  (1.727 m)  Wt 177 lb 9.6 oz (80.559 kg)  BMI 27.01 kg/m2  SpO2 98% , BMI Body mass index is 27.01 kg/(m^2). GEN: Well nourished, well developed, in no acute distress HEENT: normal Neck: no JVD, carotid bruits, or masses Cardiac: RRR; no murmurs, rubs, or gallops,no edema  Respiratory:  clear to auscultation bilaterally, normal work of breathing GI: soft, nontender, nondistended, + BS MS: no deformity or atrophy Skin: warm and dry, no rash Neuro:  Strength and sensation are intact Psych: euthymic mood, full affect    Recent Labs: 02/01/2015: ALT 32; BUN 14; Creat 1.02; Potassium 4.1; Sodium 138   Lipid Panel    Component Value Date/Time   CHOL 130  02/01/2015 0819   CHOL 124 07/28/2014 0903   TRIG 136 02/01/2015 0819   TRIG 112 07/28/2014 0903   HDL 48 02/01/2015 0819   HDL 46 07/28/2014 0903   CHOLHDL 2.7 02/01/2015 0819   LDLCALC 55 02/01/2015 0819   LDLCALC 56 07/28/2014 0903     Other studies Reviewed: Additional studies/ records that were reviewed today with results demonstrating:  Normal LV function in 1/16.   ASSESSMENT AND PLAN:  1. CAD: No angina. No exercise induced symptoms. I don't think the dizziness that he felt is related to his heart. It may be more related to the meds he was taking at the time for his eye. He has no symptoms with vigorous bike riding. 2. hyperlipidemia: Continue Crestor. He is getting the brand name Crestor- he has had problems with generics in the past.  He had a very good lipid profile a few months ago. Follow-up in the lipid clinic as well.  Tolerating his meds well.  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  He has to take his plant stanols, such as benechol.   Could consider Zetia.  Continue metamucil.  [place] at [place] with him and the PharmD regarding his lipids. 3. He feels it is difficult to lose weight. 4. Hypertension: Blood pressure well controlled. Continue current medicines. No recent spikes in his blood pressure. 5. He will be following up with the eye doctors as well.  PAF in the past.  No sustained palpitations.     Current medicines are reviewed at length with the patient today.  The patient concerns regarding his medicines were addressed.  The following changes have been made:  No change  Labs/ tests ordered today include:  No orders of the defined types were placed in this encounter.    Recommend 150 minutes/week of aerobic exercise Low fat, low carb, high fiber diet recommended  Disposition:   FU in 6 months   Teresita Madura., MD  04/05/2015 11:12 AM    [facility] [place], [place], [place]  [address] Phone: [phone]; Fax: [phone]

## 2015-04-05 ENCOUNTER — Ambulatory Visit (INDEPENDENT_AMBULATORY_CARE_PROVIDER_SITE_OTHER): Payer: Medicare Other | Admitting: Interventional Cardiology

## 2015-04-05 ENCOUNTER — Encounter: Payer: Self-pay | Admitting: Interventional Cardiology

## 2015-04-05 VITALS — BP 110/62 | HR 75 | Ht 68.0 in | Wt 177.6 lb

## 2015-04-05 DIAGNOSIS — R42 Dizziness and giddiness: Secondary | ICD-10-CM

## 2015-04-05 DIAGNOSIS — I2510 Atherosclerotic heart disease of native coronary artery without angina pectoris: Secondary | ICD-10-CM

## 2015-04-05 DIAGNOSIS — I10 Essential (primary) hypertension: Secondary | ICD-10-CM

## 2015-04-05 DIAGNOSIS — E782 Mixed hyperlipidemia: Secondary | ICD-10-CM | POA: Diagnosis not present

## 2015-04-05 DIAGNOSIS — I491 Atrial premature depolarization: Secondary | ICD-10-CM

## 2015-04-05 DIAGNOSIS — I480 Paroxysmal atrial fibrillation: Secondary | ICD-10-CM

## 2015-04-05 NOTE — Patient Instructions (Signed)
Medication Instructions:  Same-no changes  Labwork: None  Testing/Procedures: None  Follow-Up: Your physician wants you to follow-up in: 6 months. You will receive a reminder letter in the mail two months in advance. If you don't receive a letter, please call our office to schedule the follow-up appointment.      If you need a refill on your cardiac medications before your next appointment, please call your pharmacy.   

## 2015-04-12 ENCOUNTER — Encounter (HOSPITAL_COMMUNITY): Payer: Self-pay | Admitting: Emergency Medicine

## 2015-04-12 ENCOUNTER — Emergency Department (HOSPITAL_COMMUNITY)
Admission: EM | Admit: 2015-04-12 | Discharge: 2015-04-12 | Disposition: A | Discharge status: Home / Self Care | Payer: Medicare Other | Attending: Emergency Medicine | Admitting: Emergency Medicine

## 2015-04-12 DIAGNOSIS — R200 Anesthesia of skin: Secondary | ICD-10-CM | POA: Insufficient documentation

## 2015-04-12 DIAGNOSIS — Z79899 Other long term (current) drug therapy: Secondary | ICD-10-CM | POA: Insufficient documentation

## 2015-04-12 DIAGNOSIS — Z880 Allergy status to penicillin: Secondary | ICD-10-CM | POA: Diagnosis not present

## 2015-04-12 DIAGNOSIS — Z86018 Personal history of other benign neoplasm: Secondary | ICD-10-CM | POA: Diagnosis not present

## 2015-04-12 DIAGNOSIS — M7582 Other shoulder lesions, left shoulder: Secondary | ICD-10-CM | POA: Diagnosis not present

## 2015-04-12 DIAGNOSIS — Z8701 Personal history of pneumonia (recurrent): Secondary | ICD-10-CM | POA: Insufficient documentation

## 2015-04-12 DIAGNOSIS — Z7982 Long term (current) use of aspirin: Secondary | ICD-10-CM | POA: Diagnosis not present

## 2015-04-12 DIAGNOSIS — I2510 Atherosclerotic heart disease of native coronary artery without angina pectoris: Secondary | ICD-10-CM | POA: Diagnosis not present

## 2015-04-12 DIAGNOSIS — Z951 Presence of aortocoronary bypass graft: Secondary | ICD-10-CM | POA: Diagnosis not present

## 2015-04-12 DIAGNOSIS — Z87891 Personal history of nicotine dependence: Secondary | ICD-10-CM | POA: Insufficient documentation

## 2015-04-12 DIAGNOSIS — T380X5A Adverse effect of glucocorticoids and synthetic analogues, initial encounter: Secondary | ICD-10-CM | POA: Diagnosis not present

## 2015-04-12 DIAGNOSIS — I480 Paroxysmal atrial fibrillation: Secondary | ICD-10-CM | POA: Insufficient documentation

## 2015-04-12 DIAGNOSIS — M1990 Unspecified osteoarthritis, unspecified site: Secondary | ICD-10-CM | POA: Diagnosis not present

## 2015-04-12 DIAGNOSIS — Z8639 Personal history of other endocrine, nutritional and metabolic disease: Secondary | ICD-10-CM | POA: Diagnosis not present

## 2015-04-12 DIAGNOSIS — F419 Anxiety disorder, unspecified: Secondary | ICD-10-CM | POA: Diagnosis not present

## 2015-04-12 DIAGNOSIS — T50905A Adverse effect of unspecified drugs, medicaments and biological substances, initial encounter: Secondary | ICD-10-CM

## 2015-04-12 NOTE — ED Notes (Signed)
Spoke with Dr Wilson Singer about pt sx

## 2015-04-12 NOTE — Discharge Instructions (Signed)
Follow up only as needed with any additional symptoms.

## 2015-04-12 NOTE — ED Notes (Signed)
Pt symptoms have resolved.

## 2015-04-12 NOTE — ED Notes (Signed)
Pt sts had cortisone injection today in left shoulder and now having numbness and weakness in left arm

## 2015-04-12 NOTE — ED Provider Notes (Signed)
CSN: [id number]     Arrival date & time 04/12/15  1411 History   First MD Initiated Contact with Patient 04/12/15 2026     Chief Complaint  Patient presents with  . Numbness      HPI  Pt presents with a CC of medicine weaknesses left upper extremity. This occurred about a half an hour after receiving a steroid injection shoulder and orthopedic office. He notes he was having difficulty using his performed some normal exercises at home. Presents to the emergency room. Now has a normal exam and symptoms have resolved.  Past Medical History  Diagnosis Date  . Coronary artery disease 07/13/2005    CABG  . Hyperlipemia   . PAC (premature atrial contraction)   . PVC (premature ventricular contraction)   . Anxiety   . Paroxysmal atrial fibrillation (HCC)   . DJD (degenerative joint disease)   . DDD (degenerative disc disease)   . Arthritis   . Pneumonia   . Hemangioma of liver   . Elevated liver enzymes    Past Surgical History  Procedure Laterality Date  . Coronary artery bypass graft  07/13/2005  . Knee surgery Right   . Inguinal hernia repair Right 1978, 2000    x 2   Family History  Problem Relation Age of Onset  . Coronary artery disease Father   . Atrial fibrillation Mother 51  . Colon polyps Mother   . Prostate cancer Maternal Uncle   . Seizures Brother    Social History  Substance Use Topics  . Smoking status: Former Smoker    Types: Cigarettes    Quit date: 02/19/1974  . Smokeless tobacco: Never Used     Comment: stopped 32 yrs ago  . Alcohol Use: Yes     Comment: rare    Review of Systems  Constitutional: Negative for fever, chills, diaphoresis, appetite change and fatigue.  HENT: Negative for mouth sores, sore throat and trouble swallowing.   Eyes: Negative for visual disturbance.  Respiratory: Negative for cough, chest tightness, shortness of breath and wheezing.   Cardiovascular: Negative for chest pain.  Gastrointestinal: Negative for nausea, vomiting,  abdominal pain, diarrhea and abdominal distention.  Endocrine: Negative for polydipsia, polyphagia and polyuria.  Genitourinary: Negative for dysuria, frequency and hematuria.  Musculoskeletal: Negative for gait problem.  Skin: Negative for color change, pallor and rash.  Neurological: Positive for numbness. Negative for dizziness, syncope, light-headedness and headaches.  Hematological: Does not bruise/bleed easily.  Psychiatric/Behavioral: Negative for behavioral problems and confusion.      Allergies  Cephalexin; Levofloxacin; Lipitor; and Penicillins  Home Medications   Prior to Admission medications   Medication Sig Start Date End Date Taking? Authorizing Provider  acetaminophen (TYLENOL) 325 MG tablet Take 650 mg by mouth every 6 (six) hours as needed for pain.   Yes Historical Provider, MD  aspirin 325 MG tablet Take 325 mg by mouth daily.   Yes Historical Provider, MD  Cholecalciferol (VITAMIN D) 2000 UNITS tablet Take 2,000 Units by mouth daily.   Yes Historical Provider, MD  CRESTOR 20 MG tablet Take 1 tablet (20 mg total) by mouth daily. 12/06/14  Yes [name], MD  Flaxseed, Linseed, POWD Take 2 scoop by mouth as directed.   Yes Historical Provider, MD  LORazepam (ATIVAN) 0.5 MG tablet Take 1 tablet by mouth at bedtime as needed for sleep. To help with sleep 07/02/11  Yes Historical Provider, MD  metoprolol tartrate (LOPRESSOR) 25 MG tablet Take one tablet by  mouth every 6 hours as needed episodes of a-fib/ elevated BP.  Manufacturing company "[name]" Patient taking differently: Take by mouth 2 (two) times daily.  03/07/15  Yes [name], MD  Multiple Vitamin (MULTIVITAMIN WITH MINERALS) TABS Take 1 tablet by mouth daily. Centrum silver   Yes Historical Provider, MD  nitroGLYCERIN (NITROSTAT) 0.4 MG SL tablet Place 1 tablet (0.4 mg total) under the tongue every 5 (five) minutes as needed for chest pain. 01/04/14  Yes [name], MD   pramoxine-hydrocortisone [facility]) cream Apply 1 application topically at bedtime as needed (for skin).    Yes Historical Provider, MD  Psyllium (METAMUCIL PO) 4 tablespoons by mouth daily as needed, for consitpation   Yes Historical Provider, MD  Ubiquinol 100 MG CAPS Take 100 mg by mouth daily.   Yes Historical Provider, MD   BP 148/85 mmHg  Pulse 78  Temp(Src) 97.5 F (36.4 C) (Oral)  Resp 20  SpO2 99% Physical Exam  Constitutional: He is oriented to person, place, and time. He appears well-developed and well-nourished. No distress.  HENT:  Head: Normocephalic.  Eyes: Conjunctivae are normal. Pupils are equal, round, and reactive to light. No scleral icterus.  Neck: Normal range of motion. Neck supple. No thyromegaly present.  Cardiovascular: Normal rate and regular rhythm.  Exam reveals no gallop and no friction rub.   No murmur heard. Pulmonary/Chest: Effort normal and breath sounds normal. No respiratory distress. He has no wheezes. He has no rales.  Abdominal: Soft. Bowel sounds are normal. He exhibits no distension. There is no tenderness. There is no rebound.  Musculoskeletal: Normal range of motion.  Neurological: He is alert and oriented to person, place, and time.  Normal BUE neuro exam.  No symptoms.  Skin: Skin is warm and dry. No rash noted.  Psychiatric: He has a normal mood and affect. His behavior is normal.    ED Course  Procedures (including critical care time) Labs Review Labs Reviewed - No data to display  Imaging Review No results found. I have personally reviewed and evaluated these images and lab results as part of my medical decision-making.   EKG Interpretation None      MDM   Final diagnoses:  Numbness  Medication side effects, initial encounter    No continued symptoms. Clearly 2/2 local anesthetic/steroid injection effects, and resolved.  Tanna Furry, MD 04/12/15 2100

## 2015-04-13 ENCOUNTER — Ambulatory Visit (INDEPENDENT_AMBULATORY_CARE_PROVIDER_SITE_OTHER): Payer: Medicare Other | Admitting: Nurse Practitioner

## 2015-04-13 ENCOUNTER — Telehealth (HOSPITAL_BASED_OUTPATIENT_CLINIC_OR_DEPARTMENT_OTHER): Payer: Self-pay | Admitting: Emergency Medicine

## 2015-04-13 ENCOUNTER — Ambulatory Visit (INDEPENDENT_AMBULATORY_CARE_PROVIDER_SITE_OTHER)
Admission: RE | Admit: 2015-04-13 | Discharge: 2015-04-13 | Disposition: A | Discharge status: Home / Self Care | Payer: Medicare Other | Source: Ambulatory Visit | Attending: Nurse Practitioner | Admitting: Nurse Practitioner

## 2015-04-13 ENCOUNTER — Encounter: Payer: Self-pay | Admitting: Nurse Practitioner

## 2015-04-13 ENCOUNTER — Telehealth: Payer: Self-pay | Admitting: Interventional Cardiology

## 2015-04-13 VITALS — BP 128/80 | HR 83 | Ht 68.0 in | Wt 176.0 lb

## 2015-04-13 DIAGNOSIS — M79602 Pain in left arm: Secondary | ICD-10-CM

## 2015-04-13 DIAGNOSIS — I2510 Atherosclerotic heart disease of native coronary artery without angina pectoris: Secondary | ICD-10-CM

## 2015-04-13 DIAGNOSIS — R29898 Other symptoms and signs involving the musculoskeletal system: Secondary | ICD-10-CM | POA: Diagnosis not present

## 2015-04-13 DIAGNOSIS — R531 Weakness: Secondary | ICD-10-CM | POA: Diagnosis not present

## 2015-04-13 NOTE — Patient Instructions (Addendum)
We will be checking the following labs today - NONE   Medication Instructions:    Continue with your current medicines.     Testing/Procedures To Be Arranged:  CT of the head - without contrast - DX - left arm weakness  Follow-Up:   See Dr. Irish Lack as planned.     Other Special Instructions:   N/A    If you need a refill on your cardiac medications before your next appointment, please call your pharmacy.   Call the [place] office at [phone] if you have any questions, problems or concerns.

## 2015-04-13 NOTE — Telephone Encounter (Signed)
**Note De-Identified Reo Portela Obfuscation** The pt has been given an appointment to see Truitt Merle, NP today at 1:30. The pt is in agreement with appointment date and time.

## 2015-04-13 NOTE — Telephone Encounter (Signed)
New message   Pt has questions for his rn he was just seen but want appt for today to speak with dr

## 2015-04-13 NOTE — Progress Notes (Signed)
CARDIOLOGY OFFICE NOTE  Date:  04/13/2015    [name] Date of Birth: [birth year] Medical Record [id number]  PCP:   Melinda Crutch, MD  Cardiologist:  [facility]    Chief Complaint  Patient presents with  . Arm Pain    Work in visit - seen for Dr. Irish Lack    History of Present Illness: Noah [name] is a 65 y.o. male who presents today for a work in visit. Seen for Dr. Irish Lack.   He has a history of CAD with remote CABG in 2007.   He was just seen here on Valentine's and was felt to be doing well.   Phone call today - had had an injection in his arm by ortho - was sore/weak afterwards - spoke with ortho who told him this was not unusual but ended up in the ER last night and his symptoms resolved by the time of his arrival. Wanted to come here to "make sure everything with his heart was ok".   Thus added to my schedule for today.   Comes in today. Here alone. He is worried that he has a stroke. He had an injection for the left shoulder yesterday around noon with Dr. Alfonso Ramus - within 15 minutes his left arm was weak and sounds like clumsy. He was picking the wrong numbers on his cell phone. Called Dr. Alfonso Ramus - felt to be related to the shot - but he went on to the ER - was there for about 10 hours - no testing done and symptoms had improved considerably. He has not had recurrence but remains very worried. No headaches. He has had some transient lightheadedness. No syncope. No chest pain. He is leaving for vacation next week and is very worried about traveling. He remains on full dose aspirin.   Past Medical History  Diagnosis Date  . Coronary artery disease 07/13/2005    CABG  . Hyperlipemia   . PAC (premature atrial contraction)   . PVC (premature ventricular contraction)   . Anxiety   . Paroxysmal atrial fibrillation (HCC)   . DJD (degenerative joint disease)   . DDD (degenerative disc disease)   . Arthritis   . Pneumonia   . Hemangioma of liver   . Elevated  liver enzymes     Past Surgical History  Procedure Laterality Date  . Coronary artery bypass graft  07/13/2005  . Knee surgery Right   . Inguinal hernia repair Right 1978, 2000    x 2     Medications: Current Outpatient Prescriptions  Medication Sig Dispense Refill  . acetaminophen (TYLENOL) 325 MG tablet Take 650 mg by mouth every 6 (six) hours as needed for pain.    Marland Kitchen aspirin 325 MG tablet Take 325 mg by mouth daily.    . Cholecalciferol (VITAMIN D) 2000 UNITS tablet Take 2,000 Units by mouth daily.    . CRESTOR 20 MG tablet Take 1 tablet (20 mg total) by mouth daily. 90 tablet 2  . Flaxseed, Linseed, POWD Take 2 scoop by mouth as directed.    Marland Kitchen LORazepam (ATIVAN) 0.5 MG tablet Take 1 tablet by mouth at bedtime as needed for sleep. To help with sleep    . metoprolol tartrate (LOPRESSOR) 25 MG tablet Take one tablet by mouth every 6 hours as needed episodes of a-fib/ elevated BP.  Manufacturing company "[name]" (Patient taking differently: Take by mouth 2 (two) times daily. ) 30 tablet 2  . Multiple Vitamin (MULTIVITAMIN WITH MINERALS)  TABS Take 1 tablet by mouth daily. Centrum silver    . nitroGLYCERIN (NITROSTAT) 0.4 MG SL tablet Place 1 tablet (0.4 mg total) under the tongue every 5 (five) minutes as needed for chest pain. 25 tablet 3  . pramoxine-hydrocortisone (ANALPRAM HC) cream Apply 1 application topically at bedtime as needed (for skin).     . Psyllium (METAMUCIL PO) 4 tablespoons by mouth daily as needed, for consitpation    . Ubiquinol 100 MG CAPS Take 100 mg by mouth daily.     No current facility-administered medications for this visit.    Allergies: Allergies  Allergen Reactions  . Cephalexin     REACTION: tachycardia  . Levofloxacin     REACTION: tachycardia  . Lipitor [Atorvastatin]     Muscle aches on lipitor 80 mg daily  . Penicillins     REACTION: rash    Social History: The patient  reports that he quit smoking about 41 years ago. His smoking use included  Cigarettes. He has never used smokeless tobacco. He reports that he drinks alcohol. He reports that he does not use illicit drugs.   Family History: The patient's family history includes Atrial fibrillation (age of onset: 43) in his mother; Colon polyps in his mother; Coronary artery disease in his father; Prostate cancer in his maternal uncle; Seizures in his brother.   Review of Systems: Please see the history of present illness.   Otherwise, the review of systems is positive for none.   All other systems are reviewed and negative.   Physical Exam: VS:  BP 128/80 mmHg  Pulse 83  Ht 5\' 8"  (1.727 m)  Wt 176 lb (79.833 kg)  BMI 26.77 kg/m2  SpO2 98% .  BMI Body mass index is 26.77 kg/(m^2).  Wt Readings from Last 3 Encounters:  04/13/15 176 lb (79.833 kg)  04/05/15 177 lb 9.6 oz (80.559 kg)  03/14/15 183 lb (83.008 kg)    General: Pleasant. Little anxious but in no acute distress.  HEENT: Normal. Neck: Supple, no JVD, carotid bruits, or masses noted.  Cardiac: Regular rate and rhythm. No murmurs, rubs, or gallops. No edema.  Respiratory:  Lungs are clear to auscultation bilaterally with normal work of breathing.  GI: Soft and nontender.  MS: No deformity or atrophy. Gait and ROM intact. Skin: Warm and dry. Color is normal.  Neuro:  Strength and sensation are intact and no gross focal deficits noted.  Psych: Alert, appropriate and with normal affect.   LABORATORY DATA:  EKG:  EKG is not ordered today.   Lab Results  Component Value Date   WBC 9.2 01/27/2013   HGB 15.9 01/27/2013   HCT 47.6 01/27/2013   PLT 267.0 01/27/2013   GLUCOSE 87 02/01/2015   CHOL 130 02/01/2015   TRIG 136 02/01/2015   HDL 48 02/01/2015   LDLCALC 55 02/01/2015   ALT 32 02/01/2015   AST 37* 02/01/2015   NA 138 02/01/2015   K 4.1 02/01/2015   CL 103 02/01/2015   CREATININE 1.02 02/01/2015   BUN 14 02/01/2015   CO2 19* 02/01/2015   TSH 3.30 01/27/2013   HGBA1C 5.3 06/23/2013    BNP (last  3 results) No results for input(s): BNP in the last 8760 hours.  ProBNP (last 3 results) No results for input(s): PROBNP in the last 8760 hours.   Other Studies Reviewed Today:   Assessment/Plan: 1. Left arm weakness - seems like this all correlates with the injection - symptoms have resolved. He  does describe clearly left arm weakness/clumsiness. Neuro exam today is totally normal. He is on aspirin. Will arrange for CT of the head but I told him that even if negative that some other medical event could always happen and that the CT was not preventative.   2. CAD - no active symptoms  3. HTN - BP ok  Current medicines are reviewed with the patient today.  The patient does not have concerns regarding medicines other than what has been noted above.  The following changes have been made:  See above.  Labs/ tests ordered today include:    Orders Placed This Encounter  Procedures  . CT Head Wo Contrast     Disposition:   FU with Dr. Irish Lack as planned.   Patient is agreeable to this plan and will call if any problems develop in the interim.   Signed: Burtis Junes, RN, ANP-C 04/13/2015 2:30 PM  [facility] [address] [place], [place]  [address] Phone: [phone] Fax: [phone]

## 2015-04-15 ENCOUNTER — Encounter: Payer: Self-pay | Admitting: Interventional Cardiology

## 2015-04-15 DIAGNOSIS — M6281 Muscle weakness (generalized): Secondary | ICD-10-CM | POA: Diagnosis not present

## 2015-04-15 DIAGNOSIS — M25512 Pain in left shoulder: Secondary | ICD-10-CM | POA: Diagnosis not present

## 2015-04-19 NOTE — Telephone Encounter (Signed)
Please set up carotid Doppler for carotid artery disease.

## 2015-04-21 ENCOUNTER — Telehealth: Payer: Self-pay

## 2015-04-21 DIAGNOSIS — I6529 Occlusion and stenosis of unspecified carotid artery: Secondary | ICD-10-CM

## 2015-04-21 NOTE — Telephone Encounter (Signed)
Per Dr Irish Lack and due to [facility] message from 04/15/15 to Dr Irish Lack from the pt I have called and left a message for the pt to call me back to discuss/order Carotid Duplex.

## 2015-04-22 NOTE — Telephone Encounter (Signed)
LMTCB

## 2015-04-25 NOTE — Telephone Encounter (Signed)
The pt is advised and he is in agreement with having Carotid duplex done. Carotid Duplex has been ordered and a message has been sent to [facility] to call the pt to arrange date and time of Carotid Duplex.

## 2015-04-27 ENCOUNTER — Ambulatory Visit (HOSPITAL_COMMUNITY)
Admission: RE | Admit: 2015-04-27 | Discharge: 2015-04-27 | Disposition: A | Discharge status: Home / Self Care | Payer: Medicare Other | Source: Ambulatory Visit | Attending: Cardiovascular Disease | Admitting: Cardiovascular Disease

## 2015-04-27 DIAGNOSIS — E785 Hyperlipidemia, unspecified: Secondary | ICD-10-CM | POA: Insufficient documentation

## 2015-04-27 DIAGNOSIS — I6529 Occlusion and stenosis of unspecified carotid artery: Secondary | ICD-10-CM

## 2015-04-27 DIAGNOSIS — I6523 Occlusion and stenosis of bilateral carotid arteries: Secondary | ICD-10-CM | POA: Diagnosis not present

## 2015-05-03 DIAGNOSIS — M7582 Other shoulder lesions, left shoulder: Secondary | ICD-10-CM | POA: Diagnosis not present

## 2015-05-03 DIAGNOSIS — M1711 Unilateral primary osteoarthritis, right knee: Secondary | ICD-10-CM | POA: Diagnosis not present

## 2015-05-09 DIAGNOSIS — M25512 Pain in left shoulder: Secondary | ICD-10-CM | POA: Diagnosis not present

## 2015-05-09 DIAGNOSIS — M542 Cervicalgia: Secondary | ICD-10-CM | POA: Diagnosis not present

## 2015-05-09 DIAGNOSIS — M545 Low back pain: Secondary | ICD-10-CM | POA: Diagnosis not present

## 2015-05-10 DIAGNOSIS — M1711 Unilateral primary osteoarthritis, right knee: Secondary | ICD-10-CM | POA: Diagnosis not present

## 2015-05-17 DIAGNOSIS — M542 Cervicalgia: Secondary | ICD-10-CM | POA: Diagnosis not present

## 2015-05-17 DIAGNOSIS — M1711 Unilateral primary osteoarthritis, right knee: Secondary | ICD-10-CM | POA: Diagnosis not present

## 2015-05-17 DIAGNOSIS — M545 Low back pain: Secondary | ICD-10-CM | POA: Diagnosis not present

## 2015-05-17 DIAGNOSIS — M25512 Pain in left shoulder: Secondary | ICD-10-CM | POA: Diagnosis not present

## 2015-05-25 DIAGNOSIS — M545 Low back pain: Secondary | ICD-10-CM | POA: Diagnosis not present

## 2015-05-25 DIAGNOSIS — M25512 Pain in left shoulder: Secondary | ICD-10-CM | POA: Diagnosis not present

## 2015-05-25 DIAGNOSIS — M542 Cervicalgia: Secondary | ICD-10-CM | POA: Diagnosis not present

## 2015-05-29 ENCOUNTER — Other Ambulatory Visit: Payer: Self-pay | Admitting: Interventional Cardiology

## 2015-05-31 ENCOUNTER — Encounter: Payer: Self-pay | Admitting: Interventional Cardiology

## 2015-06-01 DIAGNOSIS — M545 Low back pain: Secondary | ICD-10-CM | POA: Diagnosis not present

## 2015-06-01 DIAGNOSIS — M542 Cervicalgia: Secondary | ICD-10-CM | POA: Diagnosis not present

## 2015-06-01 DIAGNOSIS — M25512 Pain in left shoulder: Secondary | ICD-10-CM | POA: Diagnosis not present

## 2015-06-03 ENCOUNTER — Telehealth: Payer: Self-pay | Admitting: Interventional Cardiology

## 2015-06-03 ENCOUNTER — Other Ambulatory Visit: Payer: Self-pay

## 2015-06-03 MED ORDER — METOPROLOL SUCCINATE ER 25 MG PO TB24: 25.0000 mg | ORAL_TABLET | Freq: Two times a day (BID) | ORAL | Status: DC

## 2015-06-03 NOTE — Telephone Encounter (Signed)
New Message:   [id number] -Please call,concerning his Metoprolol prescription.

## 2015-06-06 NOTE — Telephone Encounter (Signed)
Returned call to [facility] per patient request about receiving Metoprolol Succinate 25mg  twice daily. Patient was told is insurance does not cover the name brand and rx was sent as Name Brand Only. [facility] stated it came in as Toprol and not as Metoprolol Succinate as it says in Epic. [facility] also stated it was documented in the system  To switch to generic but it was not switched over. I spoke first with Saralyn Pilar and then with Hilliard Clark about the rx order and was told the rx is now generic and from [name] as patient requested.  Unable to reach patient by phone but will message him on Mychart.

## 2015-06-09 DIAGNOSIS — M25512 Pain in left shoulder: Secondary | ICD-10-CM | POA: Diagnosis not present

## 2015-06-09 DIAGNOSIS — M545 Low back pain: Secondary | ICD-10-CM | POA: Diagnosis not present

## 2015-06-09 DIAGNOSIS — M542 Cervicalgia: Secondary | ICD-10-CM | POA: Diagnosis not present

## 2015-06-14 ENCOUNTER — Encounter: Payer: Self-pay | Admitting: Interventional Cardiology

## 2015-06-15 DIAGNOSIS — M545 Low back pain: Secondary | ICD-10-CM | POA: Diagnosis not present

## 2015-06-15 DIAGNOSIS — M542 Cervicalgia: Secondary | ICD-10-CM | POA: Diagnosis not present

## 2015-06-15 DIAGNOSIS — M25512 Pain in left shoulder: Secondary | ICD-10-CM | POA: Diagnosis not present

## 2015-06-22 DIAGNOSIS — M25512 Pain in left shoulder: Secondary | ICD-10-CM | POA: Diagnosis not present

## 2015-06-22 DIAGNOSIS — M542 Cervicalgia: Secondary | ICD-10-CM | POA: Diagnosis not present

## 2015-06-22 DIAGNOSIS — M545 Low back pain: Secondary | ICD-10-CM | POA: Diagnosis not present

## 2015-06-23 DIAGNOSIS — L723 Sebaceous cyst: Secondary | ICD-10-CM | POA: Diagnosis not present

## 2015-06-23 DIAGNOSIS — F419 Anxiety disorder, unspecified: Secondary | ICD-10-CM | POA: Diagnosis not present

## 2015-06-27 ENCOUNTER — Encounter: Payer: Self-pay | Admitting: Interventional Cardiology

## 2015-06-28 DIAGNOSIS — H35371 Puckering of macula, right eye: Secondary | ICD-10-CM | POA: Diagnosis not present

## 2015-06-28 DIAGNOSIS — H43813 Vitreous degeneration, bilateral: Secondary | ICD-10-CM | POA: Diagnosis not present

## 2015-06-28 DIAGNOSIS — H3582 Retinal ischemia: Secondary | ICD-10-CM | POA: Diagnosis not present

## 2015-06-29 ENCOUNTER — Other Ambulatory Visit: Payer: Self-pay | Admitting: *Deleted

## 2015-06-29 ENCOUNTER — Encounter: Payer: Self-pay | Admitting: Interventional Cardiology

## 2015-06-29 DIAGNOSIS — M542 Cervicalgia: Secondary | ICD-10-CM | POA: Diagnosis not present

## 2015-06-29 DIAGNOSIS — M545 Low back pain: Secondary | ICD-10-CM | POA: Diagnosis not present

## 2015-06-29 DIAGNOSIS — M25512 Pain in left shoulder: Secondary | ICD-10-CM | POA: Diagnosis not present

## 2015-06-29 MED ORDER — CRESTOR 20 MG PO TABS
20.0000 mg | ORAL_TABLET | Freq: Every day | ORAL | Status: DC
Start: 2015-06-29 — End: 2015-09-20

## 2015-07-11 DIAGNOSIS — L820 Inflamed seborrheic keratosis: Secondary | ICD-10-CM | POA: Diagnosis not present

## 2015-07-11 DIAGNOSIS — L821 Other seborrheic keratosis: Secondary | ICD-10-CM | POA: Diagnosis not present

## 2015-07-11 DIAGNOSIS — L720 Epidermal cyst: Secondary | ICD-10-CM | POA: Diagnosis not present

## 2015-07-14 DIAGNOSIS — M542 Cervicalgia: Secondary | ICD-10-CM | POA: Diagnosis not present

## 2015-07-14 DIAGNOSIS — M25512 Pain in left shoulder: Secondary | ICD-10-CM | POA: Diagnosis not present

## 2015-07-14 DIAGNOSIS — M545 Low back pain: Secondary | ICD-10-CM | POA: Diagnosis not present

## 2015-07-26 ENCOUNTER — Other Ambulatory Visit: Payer: Medicare Other

## 2015-07-27 DIAGNOSIS — M545 Low back pain: Secondary | ICD-10-CM | POA: Diagnosis not present

## 2015-07-27 DIAGNOSIS — M542 Cervicalgia: Secondary | ICD-10-CM | POA: Diagnosis not present

## 2015-07-27 DIAGNOSIS — M25512 Pain in left shoulder: Secondary | ICD-10-CM | POA: Diagnosis not present

## 2015-08-01 ENCOUNTER — Other Ambulatory Visit: Payer: Self-pay | Admitting: Sports Medicine

## 2015-08-01 DIAGNOSIS — M545 Low back pain: Secondary | ICD-10-CM | POA: Diagnosis not present

## 2015-08-01 DIAGNOSIS — M25552 Pain in left hip: Secondary | ICD-10-CM | POA: Diagnosis not present

## 2015-08-02 ENCOUNTER — Ambulatory Visit: Payer: Medicare Other | Admitting: Pharmacist

## 2015-08-02 ENCOUNTER — Other Ambulatory Visit (INDEPENDENT_AMBULATORY_CARE_PROVIDER_SITE_OTHER): Payer: Medicare Other | Admitting: *Deleted

## 2015-08-02 DIAGNOSIS — E782 Mixed hyperlipidemia: Secondary | ICD-10-CM | POA: Diagnosis not present

## 2015-08-02 LAB — COMPREHENSIVE METABOLIC PANEL
ALT: 34 U/L (ref 9–46)
AST: 29 U/L (ref 10–35)
Albumin: 4.6 g/dL (ref 3.6–5.1)
Alkaline Phosphatase: 78 U/L (ref 40–115)
BUN: 14 mg/dL (ref 7–25)
CALCIUM: 9.7 mg/dL (ref 8.6–10.3)
CO2: 27 mmol/L (ref 20–31)
CREATININE: 1.09 mg/dL (ref 0.70–1.25)
Chloride: 103 mmol/L (ref 98–110)
Glucose, Bld: 94 mg/dL (ref 65–99)
Potassium: 4.3 mmol/L (ref 3.5–5.3)
Sodium: 140 mmol/L (ref 135–146)
TOTAL PROTEIN: 7.4 g/dL (ref 6.1–8.1)
Total Bilirubin: 0.7 mg/dL (ref 0.2–1.2)

## 2015-08-02 NOTE — Addendum Note (Signed)
Addended by: Eulis Foster on: 08/02/2015 09:02 AM   Modules accepted: Orders

## 2015-08-06 LAB — CARDIO IQ(R) ADVANCED LIPID PANEL
Apolipoprotein B: 70 mg/dL (ref 52–109)
Cholesterol, Total: 138 mg/dL (ref 125–200)
Cholesterol/HDL Ratio: 2.8 calc (ref <=5.0)
HDL CHOLESTEROL (CARDIO IQ ADV LIPID PANEL): 49 mg/dL (ref >=40)
LDL LARGE: 6468 nmol/L (ref 4334–10815)
LDL Medium: 190 nmol/L (ref 167–465)
LDL Particle Number: 1063 nmol/L (ref 1016–2185)
LDL Peak Size: 210.3 Angstrom — ABNORMAL LOW (ref >=218.2)
LDL SMALL: 232 nmol/L (ref 123–441)
LDL, Calculated: 51 mg/dL
Non-HDL Cholesterol: 89 mg/dL
Triglycerides: 190 mg/dL — ABNORMAL HIGH

## 2015-08-07 ENCOUNTER — Ambulatory Visit
Admission: RE | Admit: 2015-08-07 | Discharge: 2015-08-07 | Disposition: A | Discharge status: Home / Self Care | Payer: Medicare Other | Source: Ambulatory Visit | Attending: Sports Medicine | Admitting: Sports Medicine

## 2015-08-07 DIAGNOSIS — M5126 Other intervertebral disc displacement, lumbar region: Secondary | ICD-10-CM | POA: Diagnosis not present

## 2015-08-07 DIAGNOSIS — M545 Low back pain: Secondary | ICD-10-CM

## 2015-08-08 ENCOUNTER — Telehealth: Payer: Self-pay | Admitting: Interventional Cardiology

## 2015-08-08 NOTE — Telephone Encounter (Signed)
Spoke with patient. He states [employer] will be faxing [place] a Formulary exception form.

## 2015-08-08 NOTE — Telephone Encounter (Signed)
New Message  Pt calling to speak w/RN- wanted to discuss [employer] sending information about pt's RX of Crestor. Pt requested to speak w/ RN. Please call back and discuss.

## 2015-08-09 ENCOUNTER — Ambulatory Visit (INDEPENDENT_AMBULATORY_CARE_PROVIDER_SITE_OTHER): Payer: Medicare Other | Admitting: Pharmacist

## 2015-08-09 DIAGNOSIS — S335XXA Sprain of ligaments of lumbar spine, initial encounter: Secondary | ICD-10-CM | POA: Diagnosis not present

## 2015-08-09 DIAGNOSIS — E782 Mixed hyperlipidemia: Secondary | ICD-10-CM

## 2015-08-09 DIAGNOSIS — I6529 Occlusion and stenosis of unspecified carotid artery: Secondary | ICD-10-CM

## 2015-08-09 NOTE — Progress Notes (Signed)
HPI Mr. Catton is a 65 yo patient of Dr. Irish Lack who is followed in the [facility] for his h/o elevated LDL-P and h/o CABG.  Patient has been working with the lipid clinic for years for management of his cholesterol.  His stress test was normal late 2013 showing no ischemia.    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  He is complaint with all of his medications and no side effect issues.  He is unable to take benecol due to the company stopping production.     Pt is doing well since last visit.  He is concerned his TG were elevated.  He cannot contribute it to one thing.  He may have had more ice cream, cheese and alcohol but is unsure.  He continues to exercise at least 30 minutes each day.    Cardiac risk factors CAD/CABG, HTN, age.  Target LDL < 70, LDL-P < 1000 nmol/L. Target non-HDL <100.   Medications currently taking: Crestor 20 mg qd, Metamucil 1-2 tablespoons bid.  Intolerance Lipitor 80 mg (myalgias). Stopped fish oil on own early 2013 due to report of possible prostate cancer with fish oil use.  Labs  (07/2015): LDL-P 1063, LDL 51, TG 190, HDL49, Lp(a) < 10, ApoB- 70, LFTs normal  (01/2015): LDL-P 1068, LDL 55, TG 136, HDL 48, Lp(a) <10, ApoB- 69, LFTs stable (07/2014): LDL-P 888, LDL 56, TG 112, HDL 46, LFTs normal  (11/2013): LDL-P 975, LDL 67, TG 115, HDL 47, ALT normal  (06/2013):  LDL-P number 1149, small LDL-P 792, LDL 58, TG 108, HDL 50, LFTs normal (Crestor 20 mg qd, Benechol chews bid, metamucil bid, flax seed) (12/2012):  LDL-P number 918 nmol/L (goal < 1000), small LDL-P 574, LDL 56 mg/dL (goal < 70), non-HDL 74 mg/dL (goal < 100).  TG 92, HDL 48 (Crestor 20 mg qd, Benecol chews bid, metamucil bid, flax seed).    Current Outpatient Prescriptions  Medication Sig Dispense Refill  . acetaminophen (TYLENOL) 325 MG tablet Take 650 mg by mouth every 6 (six) hours as needed for pain.    Marland Kitchen aspirin 325 MG tablet Take 325 mg by mouth daily.    . Cholecalciferol (VITAMIN D) 2000  UNITS tablet Take 2,000 Units by mouth daily.    . CRESTOR 20 MG tablet Take 1 tablet (20 mg total) by mouth daily. 90 tablet 2  . Flaxseed, Linseed, POWD Take 2 scoop by mouth as directed.    Marland Kitchen LORazepam (ATIVAN) 0.5 MG tablet Take 1 tablet by mouth at bedtime as needed for sleep. To help with sleep    . metoprolol succinate (TOPROL XL) 25 MG 24 hr tablet Take 1 tablet (25 mg total) by mouth 2 (two) times daily with a meal. 180 tablet 3  . metoprolol tartrate (LOPRESSOR) 25 MG tablet Take one tablet by mouth every 6 hours as needed episodes of a-fib/ elevated BP.  Manufacturing company "[name]" (Patient taking differently: Take by mouth 2 (two) times daily. ) 30 tablet 2  . Multiple Vitamin (MULTIVITAMIN WITH MINERALS) TABS Take 1 tablet by mouth daily. Centrum silver    . nitroGLYCERIN (NITROSTAT) 0.4 MG SL tablet Place 1 tablet (0.4 mg total) under the tongue every 5 (five) minutes as needed for chest pain. 25 tablet 3  . pramoxine-hydrocortisone (ANALPRAM HC) cream Apply 1 application topically at bedtime as needed (for skin).     . Psyllium (METAMUCIL PO) 4 tablespoons by mouth daily as needed, for consitpation    .  Ubiquinol 100 MG CAPS Take 100 mg by mouth daily.     No current facility-administered medications for this visit.   Allergies  Allergen Reactions  . Cephalexin     REACTION: tachycardia  . Levofloxacin     REACTION: tachycardia  . Lipitor [Atorvastatin]     Muscle aches on lipitor 80 mg daily  . Penicillins     REACTION: rash   Family History  Problem Relation Age of Onset  . Coronary artery disease Father   . Atrial fibrillation Mother 31  . Colon polyps Mother   . Prostate cancer Maternal Uncle   . Seizures Brother      Assessment and Plan 1.  Hyperlipidemia- Pt's LDL and LDL-P stable. TG elevated but ApoB and non-HDL still at goal.  Pt concerned over particle size pattern B.  We discussed the lack of correlation with this once particle number is at goal.  He is  considering starting Krill oil for his TG but afraid it will increase particle number.  Suggested he hold off until we see if his TG continue to trend up.  Recheck labs in 6 months.

## 2015-08-14 ENCOUNTER — Encounter: Payer: Self-pay | Admitting: Interventional Cardiology

## 2015-08-17 ENCOUNTER — Encounter: Payer: Self-pay | Admitting: Nurse Practitioner

## 2015-08-21 ENCOUNTER — Encounter: Payer: Self-pay | Admitting: Interventional Cardiology

## 2015-08-25 ENCOUNTER — Encounter: Payer: Self-pay | Admitting: Interventional Cardiology

## 2015-08-25 ENCOUNTER — Telehealth: Payer: Self-pay | Admitting: Interventional Cardiology

## 2015-08-25 NOTE — Telephone Encounter (Signed)
**Note De-Identified Stace Peace Obfuscation** The pt is requesting to speak with Jenean Lindau, LPN concerning his Crestor. He states that he has about a 20 day supply left of Crestor and that Crestor has been removed from the formulary list and he cant afford the cost.  I offered to ask Dr Irish Lack if he can try a different statin but the pt states that he has tried different ones in the past and that Crestor is the only one that works for him.  He says that if he is not home to call his cell phone at [phone].

## 2015-08-25 NOTE — Telephone Encounter (Signed)
New Message   Pt c/o medication issue:  1. Name of Medication: Crestor   2. How are you currently taking this medication (dosage and times per day)? 20 mg   3. Are you having a reaction (difficulty breathing--STAT)? No   4. What is your medication issue? Pt is unable to purchase med b/c of the high purchase. Pt states he spoke with RN about this and we were to call the pharmacy. Pt wants to know if the call was make to the pharmacy about the price of med. Please call back to discuss

## 2015-08-26 DIAGNOSIS — M79605 Pain in left leg: Secondary | ICD-10-CM | POA: Diagnosis not present

## 2015-08-26 DIAGNOSIS — M545 Low back pain: Secondary | ICD-10-CM | POA: Diagnosis not present

## 2015-08-26 DIAGNOSIS — M25512 Pain in left shoulder: Secondary | ICD-10-CM | POA: Diagnosis not present

## 2015-08-26 DIAGNOSIS — M542 Cervicalgia: Secondary | ICD-10-CM | POA: Diagnosis not present

## 2015-08-26 NOTE — Telephone Encounter (Signed)
Spoke with patient today. Advised him I would send an appeal form to an independent reviewer. After this it is out of our control. He voiced understanding.

## 2015-09-02 ENCOUNTER — Encounter: Payer: Self-pay | Admitting: Interventional Cardiology

## 2015-09-08 DIAGNOSIS — M542 Cervicalgia: Secondary | ICD-10-CM | POA: Diagnosis not present

## 2015-09-08 DIAGNOSIS — M79605 Pain in left leg: Secondary | ICD-10-CM | POA: Diagnosis not present

## 2015-09-08 DIAGNOSIS — M25512 Pain in left shoulder: Secondary | ICD-10-CM | POA: Diagnosis not present

## 2015-09-08 DIAGNOSIS — M545 Low back pain: Secondary | ICD-10-CM | POA: Diagnosis not present

## 2015-09-13 ENCOUNTER — Encounter: Payer: Self-pay | Admitting: Pharmacist

## 2015-09-15 ENCOUNTER — Telehealth: Payer: Self-pay | Admitting: Pharmacist

## 2015-09-15 NOTE — Telephone Encounter (Signed)
Pt sent email yesterday inquiring about status of tier exception for Crestor.  He had received a letter stating it was denied and wanted to know if we were going to appeal.  We have already worked to get patient a formulary exception for brand name Crestor and sent the application in for tier reduction.  The insurance company states they do not grant tier exceptions for non-formulary medications.  Given this information, I explained to the patient that we had done everything we could do and if he would like to continue the appeals process then he would be responsible for initiating that.   Pt states he took generic Crestor for 1 week and it gave him an upset stomach.  The only documentation available is when he sent an email January 2017 stating all generic statins caused him to be ill.  Told pt that he may have had a reaction to one brand of generic Crestor and now that there are multiple manufacturers, he may be able to try others without any issues.  He will discuss with MD at appointment next week.

## 2015-09-20 ENCOUNTER — Ambulatory Visit (INDEPENDENT_AMBULATORY_CARE_PROVIDER_SITE_OTHER): Payer: Medicare Other | Admitting: Interventional Cardiology

## 2015-09-20 ENCOUNTER — Encounter: Payer: Self-pay | Admitting: Interventional Cardiology

## 2015-09-20 VITALS — BP 100/70 | HR 88 | Ht 68.0 in | Wt 177.0 lb

## 2015-09-20 DIAGNOSIS — I6529 Occlusion and stenosis of unspecified carotid artery: Secondary | ICD-10-CM

## 2015-09-20 DIAGNOSIS — R079 Chest pain, unspecified: Secondary | ICD-10-CM

## 2015-09-20 DIAGNOSIS — M791 Myalgia, unspecified site: Secondary | ICD-10-CM

## 2015-09-20 DIAGNOSIS — I2510 Atherosclerotic heart disease of native coronary artery without angina pectoris: Secondary | ICD-10-CM | POA: Diagnosis not present

## 2015-09-20 DIAGNOSIS — I10 Essential (primary) hypertension: Secondary | ICD-10-CM | POA: Diagnosis not present

## 2015-09-20 DIAGNOSIS — E785 Hyperlipidemia, unspecified: Secondary | ICD-10-CM

## 2015-09-20 MED ORDER — METOPROLOL TARTRATE 25 MG PO TABS: 25.0000 mg | ORAL_TABLET | Freq: Two times a day (BID) | ORAL | 3 refills | Status: DC

## 2015-09-20 MED ORDER — ROSUVASTATIN CALCIUM 40 MG PO TABS: 40.0000 mg | ORAL_TABLET | Freq: Every day | ORAL | 3 refills | Status: DC

## 2015-09-20 MED ORDER — NITROGLYCERIN 0.4 MG SL SUBL: 0.4000 mg | SUBLINGUAL_TABLET | SUBLINGUAL | 3 refills | Status: DC | PRN

## 2015-09-20 NOTE — Progress Notes (Signed)
Patient ID: Noah [name], male   DOB: 1950-05-12, 64 y.o.   MRN: [id number]     Cardiology Office Note   Date:  09/20/2015   ID:  Noah [name], DOB 1950-08-30, MRN [id number]  PCP:   Melinda Crutch, MD    No chief complaint on file. f/u CAD   Wt Readings from Last 3 Encounters:  09/20/15 177 lb (80.3 kg)  04/13/15 176 lb (79.8 kg)  04/05/15 177 lb 9.6 oz (80.6 kg)       History of Present Illness: Noah [name] is a 65 y.o. male  who has had CAD. He had CABG in 2007.   He continues to exercise regularly. He does 30 minutes on the bike daily while in town.  He has had short runs of AFib in the past. Palpitations had stopped since he stopped fish oil due to prostate cancer concerns. BP has been controlled at home. He has occasional PACs and PVCs. CAD/ASCVD:  Denies : Chest pain.  Dyspnea on exertion.  Leg edema.  Nitroglycerin.  Orthopnea.    He rides his bike typically 5 days a week and has no trouble with shortness of breath.     In January 2017, he had a severe left eye infection.  THis cleared after drops and then oral doxycycline and valacyclovir.  Sx improved.  The appearance of his left eye improved. He had use a cream on the left eye as well.   He had  A  reaction to a shoulder injection in Feb 2017.  No other cardiac issues.    He felt he was intolerant to generic Crestor. Insurance company has denied him brand name Crestor.  In the past, he had arm muscle pains with lipitor, brand name.  He did not tolerate generic atorvastatin since then.    He has had some leg pains that are improving.  This is his biggest concern today. He has had bilateral quadriceps pain after playing a lot of golf a month ago. He also has left arm pain so that it's hard to reach with his left hand, his hair.    Past Medical History:  Diagnosis Date  . Anxiety   . Arthritis   . Coronary artery disease 07/13/2005   CABG  . DDD (degenerative disc disease)   . DJD  (degenerative joint disease)   . Elevated liver enzymes   . Hemangioma of liver   . Hyperlipemia   . PAC (premature atrial contraction)   . Paroxysmal atrial fibrillation (HCC)   . Pneumonia   . PVC (premature ventricular contraction)     Past Surgical History:  Procedure Laterality Date  . CORONARY ARTERY BYPASS GRAFT  07/13/2005  . INGUINAL HERNIA REPAIR Right 1978, 2000   x 2  . KNEE SURGERY Right      Current Outpatient Prescriptions  Medication Sig Dispense Refill  . acetaminophen (TYLENOL) 325 MG tablet Take 650 mg by mouth every 6 (six) hours as needed for pain.    Marland Kitchen aspirin 325 MG tablet Take 325 mg by mouth daily.    . Cholecalciferol (VITAMIN D) 2000 UNITS tablet Take 2,000 Units by mouth daily.    . CRESTOR 20 MG tablet Take 1 tablet (20 mg total) by mouth daily. 90 tablet 2  . Flaxseed, Linseed, POWD Take 2 scoop by mouth as directed.    Marland Kitchen LORazepam (ATIVAN) 0.5 MG tablet Take 1 tablet by mouth at bedtime as needed for sleep. To help with sleep    .  metoprolol succinate (TOPROL XL) 25 MG 24 hr tablet Take 1 tablet (25 mg total) by mouth 2 (two) times daily with a meal. 180 tablet 3  . metoprolol tartrate (LOPRESSOR) 25 MG tablet Take 25 mg by mouth 2 (two) times daily.    . Multiple Vitamin (MULTIVITAMIN WITH MINERALS) TABS Take 1 tablet by mouth daily. Centrum silver    . nitroGLYCERIN (NITROSTAT) 0.4 MG SL tablet Place 1 tablet (0.4 mg total) under the tongue every 5 (five) minutes as needed for chest pain. 25 tablet 3  . nitroGLYCERIN (NITROSTAT) 0.4 MG SL tablet Place 0.4 mg under the tongue every 5 (five) minutes as needed for chest pain (3 DOSES MAX).    Marland Kitchen pramoxine-hydrocortisone (ANALPRAM HC) cream Apply 1 application topically at bedtime as needed (for skin).     . Psyllium (METAMUCIL PO) 4 tablespoons by mouth daily as needed, for consitpation    . Ubiquinol 100 MG CAPS Take 100 mg by mouth daily.     No current facility-administered medications for this  visit.     Allergies:   Cephalexin; Levofloxacin; Lipitor [atorvastatin]; and Penicillins    Social History:  The patient  reports that he quit smoking about 41 years ago. His smoking use included Cigarettes. He has never used smokeless tobacco. He reports that he drinks alcohol. He reports that he does not use drugs.   Family History:  The patient's family history includes Atrial fibrillation (age of onset: 38) in his mother; Colon polyps in his mother; Coronary artery disease in his father; Heart attack in his father, maternal grandfather, paternal grandfather, and paternal grandmother; Prostate cancer in his maternal uncle; Seizures in his brother.    ROS:  Please see the history of present illness.   Otherwise, review of systems are positive for leg pain in his quadriceps.   All other systems are reviewed and negative.    PHYSICAL EXAM: VS:  BP 100/70   Pulse 88   Ht 5' 8" (1.727 m)   Wt 177 lb (80.3 kg)   BMI 26.91 kg/m  , BMI Body mass index is 26.91 kg/m. GEN: Well nourished, well developed, in no acute distress  HEENT: normal  Neck: no JVD, carotid bruits, or masses Cardiac: RRR; no murmurs, rubs, or gallops,no edema  Respiratory:  clear to auscultation bilaterally, normal work of breathing GI: soft, nontender, nondistended, + BS MS: no deformity or atrophy  Skin: warm and dry, no rash Neuro:  Strength and sensation are intact Psych: euthymic mood, full affect    Recent Labs: 08/02/2015: ALT 34; BUN 14; Creat 1.09; Potassium 4.3; Sodium 140   Lipid Panel    Component Value Date/Time   CHOL 138 08/02/2015 0902   CHOL 124 07/28/2014 0903   TRIG 190 (H) 08/02/2015 0902   TRIG 112 07/28/2014 0903   HDL 49 08/02/2015 0902   HDL 46 07/28/2014 0903   CHOLHDL 2.8 08/02/2015 0902   LDLCALC 51 08/02/2015 0902   LDLCALC 56 07/28/2014 0903     Other studies Reviewed: Additional studies/ records that were reviewed today with results demonstrating: Normal LV function in  1/16.   ASSESSMENT AND PLAN:  1. CAD: No angina. Some atypical left shoulder pains. No exercise induced symptoms.  Legs feel okay with exercise. He has no symptoms with vigorous bike riding.  Leg pain is not related to cardiovascular issues. He has good pulses in both feet. It does interfere with his sleep. 2. hyperlipidemia: Continue Crestor. He is getting the  brand name Crestor- he has had problems with generics in the past.  He had a very good lipid profile a few months ago. Follow-up in the lipid clinic as well.  Tolerating his meds well.  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  He has to take his plant stanols, such as benechol.   Could consider Zetia.  Continue metamucil.  At this point, the biggest issue regarding his lipids as the cost of brand name Crestor. Spoke at length with him and the PharmD , Elberta Leatherwood regarding his lipids.  He has had difficulty getting brand name Crestor.  He is willing to try generic rosuvastatin made by the [facility]. 3. Muscle pains in his quadriceps and left shoulder. Will check CK and ESR. He may need to be further evaluated for some type of rheumatologic issue like polymyalgia rheumatica if his symptoms persist. I've asked him to follow-up with Dr. Harrington Challenger. 4. Hypertension: Blood pressure well controlled. Continue current medicines. No recent spikes in his blood pressure. 5. He will be following up with the eye doctors as well.  PAF in the past.  No sustained palpitations.     Current medicines are reviewed at length with the patient today.  The patient concerns regarding his medicines were addressed.  The following changes have been made:  No change  Labs/ tests ordered today include:  No orders of the defined types were placed in this encounter.   Recommend 150 minutes/week of aerobic exercise Low fat, low carb, high fiber diet recommended  Disposition:   FU in 6 months   Signed, Larae Grooms, MD  09/20/2015 12:17 PM    [facility]  [facility] [address], [place], [place]  [address] Phone: [phone]; Fax: [phone]

## 2015-09-20 NOTE — Patient Instructions (Signed)
**Note De-Identified Noah [name] Obfuscation** Medication Instructions:  Same-no changes  Labwork: CMET, ESR and CK today  Testing/Procedures: None  Follow-Up: Your physician wants you to follow-up in: 6 months. You will receive a reminder letter in the mail two months in advance. If you don't receive a letter, please call our office to schedule the follow-up appointment.     If you need a refill on your cardiac medications before your next appointment, please call your pharmacy.

## 2015-09-21 DIAGNOSIS — M545 Low back pain: Secondary | ICD-10-CM | POA: Diagnosis not present

## 2015-09-21 DIAGNOSIS — M79605 Pain in left leg: Secondary | ICD-10-CM | POA: Diagnosis not present

## 2015-09-21 DIAGNOSIS — M542 Cervicalgia: Secondary | ICD-10-CM | POA: Diagnosis not present

## 2015-09-21 DIAGNOSIS — M25512 Pain in left shoulder: Secondary | ICD-10-CM | POA: Diagnosis not present

## 2015-09-21 LAB — SEDIMENTATION RATE: Sed Rate: 6 mm/hr (ref 0–20)

## 2015-09-21 LAB — COMPREHENSIVE METABOLIC PANEL
ALBUMIN: 4.5 g/dL (ref 3.6–5.1)
ALT: 34 U/L (ref 9–46)
AST: 24 U/L (ref 10–35)
Alkaline Phosphatase: 80 U/L (ref 40–115)
BUN: 14 mg/dL (ref 7–25)
CHLORIDE: 104 mmol/L (ref 98–110)
CO2: 25 mmol/L (ref 20–31)
CREATININE: 1.03 mg/dL (ref 0.70–1.25)
Calcium: 10.1 mg/dL (ref 8.6–10.3)
Glucose, Bld: 82 mg/dL (ref 65–99)
POTASSIUM: 5.3 mmol/L (ref 3.5–5.3)
Sodium: 140 mmol/L (ref 135–146)
Total Bilirubin: 0.6 mg/dL (ref 0.2–1.2)
Total Protein: 7.4 g/dL (ref 6.1–8.1)

## 2015-09-23 ENCOUNTER — Other Ambulatory Visit: Payer: Self-pay | Admitting: Interventional Cardiology

## 2015-09-23 ENCOUNTER — Encounter: Payer: Self-pay | Admitting: Interventional Cardiology

## 2015-09-23 ENCOUNTER — Telehealth: Payer: Self-pay | Admitting: Interventional Cardiology

## 2015-09-23 LAB — CK TOTAL AND CKMB (NOT AT ARMC): CK TOTAL: 111 U/L (ref 7–232)

## 2015-09-23 NOTE — Telephone Encounter (Signed)
Left message to call back  

## 2015-09-23 NOTE — Telephone Encounter (Signed)
New message   Noah [name] from [facility] call requesting to speak with RN. Noah [name] states she was not able to complete the CKMB vaccination. Please call back to discuss

## 2015-09-25 ENCOUNTER — Encounter: Payer: Self-pay | Admitting: Interventional Cardiology

## 2015-09-26 ENCOUNTER — Other Ambulatory Visit: Payer: Self-pay | Admitting: Interventional Cardiology

## 2015-09-26 MED ORDER — ROSUVASTATIN CALCIUM 20 MG PO TABS: 20.0000 mg | ORAL_TABLET | Freq: Every day | ORAL | 3 refills | Status: DC

## 2015-09-26 NOTE — Telephone Encounter (Signed)
We did receive normal Total CK results on the pt of 111. Dr Irish Lack has reviewed and is ok with results.

## 2015-09-27 ENCOUNTER — Encounter: Payer: Self-pay | Admitting: Interventional Cardiology

## 2015-09-27 ENCOUNTER — Other Ambulatory Visit: Payer: Self-pay | Admitting: *Deleted

## 2015-09-27 MED ORDER — ROSUVASTATIN CALCIUM 20 MG PO TABS: 20.0000 mg | ORAL_TABLET | Freq: Every day | ORAL | 3 refills | Status: DC

## 2015-09-30 ENCOUNTER — Other Ambulatory Visit: Payer: Self-pay

## 2015-10-05 DIAGNOSIS — M545 Low back pain: Secondary | ICD-10-CM | POA: Diagnosis not present

## 2015-10-05 DIAGNOSIS — M542 Cervicalgia: Secondary | ICD-10-CM | POA: Diagnosis not present

## 2015-10-05 DIAGNOSIS — M79605 Pain in left leg: Secondary | ICD-10-CM | POA: Diagnosis not present

## 2015-10-05 DIAGNOSIS — M25512 Pain in left shoulder: Secondary | ICD-10-CM | POA: Diagnosis not present

## 2015-10-10 DIAGNOSIS — M25512 Pain in left shoulder: Secondary | ICD-10-CM | POA: Diagnosis not present

## 2015-10-10 DIAGNOSIS — M79605 Pain in left leg: Secondary | ICD-10-CM | POA: Diagnosis not present

## 2015-10-10 DIAGNOSIS — M545 Low back pain: Secondary | ICD-10-CM | POA: Diagnosis not present

## 2015-10-10 DIAGNOSIS — M25519 Pain in unspecified shoulder: Secondary | ICD-10-CM | POA: Diagnosis not present

## 2015-10-10 DIAGNOSIS — H539 Unspecified visual disturbance: Secondary | ICD-10-CM | POA: Diagnosis not present

## 2015-10-10 DIAGNOSIS — R030 Elevated blood-pressure reading, without diagnosis of hypertension: Secondary | ICD-10-CM | POA: Diagnosis not present

## 2015-10-10 DIAGNOSIS — M542 Cervicalgia: Secondary | ICD-10-CM | POA: Diagnosis not present

## 2015-10-13 DIAGNOSIS — H43391 Other vitreous opacities, right eye: Secondary | ICD-10-CM | POA: Diagnosis not present

## 2015-10-13 DIAGNOSIS — H5319 Other subjective visual disturbances: Secondary | ICD-10-CM | POA: Diagnosis not present

## 2015-10-13 DIAGNOSIS — H35371 Puckering of macula, right eye: Secondary | ICD-10-CM | POA: Diagnosis not present

## 2015-10-13 DIAGNOSIS — H43813 Vitreous degeneration, bilateral: Secondary | ICD-10-CM | POA: Diagnosis not present

## 2015-10-25 DIAGNOSIS — M79605 Pain in left leg: Secondary | ICD-10-CM | POA: Diagnosis not present

## 2015-10-25 DIAGNOSIS — M542 Cervicalgia: Secondary | ICD-10-CM | POA: Diagnosis not present

## 2015-10-25 DIAGNOSIS — M545 Low back pain: Secondary | ICD-10-CM | POA: Diagnosis not present

## 2015-10-25 DIAGNOSIS — M25512 Pain in left shoulder: Secondary | ICD-10-CM | POA: Diagnosis not present

## 2015-11-01 DIAGNOSIS — Z125 Encounter for screening for malignant neoplasm of prostate: Secondary | ICD-10-CM | POA: Diagnosis not present

## 2015-11-01 DIAGNOSIS — M25512 Pain in left shoulder: Secondary | ICD-10-CM | POA: Diagnosis not present

## 2015-11-01 DIAGNOSIS — Z0000 Encounter for general adult medical examination without abnormal findings: Secondary | ICD-10-CM | POA: Diagnosis not present

## 2015-11-01 DIAGNOSIS — F419 Anxiety disorder, unspecified: Secondary | ICD-10-CM | POA: Diagnosis not present

## 2015-11-02 DIAGNOSIS — M79605 Pain in left leg: Secondary | ICD-10-CM | POA: Diagnosis not present

## 2015-11-02 DIAGNOSIS — M542 Cervicalgia: Secondary | ICD-10-CM | POA: Diagnosis not present

## 2015-11-02 DIAGNOSIS — M25512 Pain in left shoulder: Secondary | ICD-10-CM | POA: Diagnosis not present

## 2015-11-02 DIAGNOSIS — M545 Low back pain: Secondary | ICD-10-CM | POA: Diagnosis not present

## 2015-11-07 ENCOUNTER — Other Ambulatory Visit: Payer: Self-pay | Admitting: Orthopedic Surgery

## 2015-11-07 DIAGNOSIS — M25512 Pain in left shoulder: Secondary | ICD-10-CM

## 2015-11-09 DIAGNOSIS — M79605 Pain in left leg: Secondary | ICD-10-CM | POA: Diagnosis not present

## 2015-11-09 DIAGNOSIS — M25512 Pain in left shoulder: Secondary | ICD-10-CM | POA: Diagnosis not present

## 2015-11-09 DIAGNOSIS — M545 Low back pain: Secondary | ICD-10-CM | POA: Diagnosis not present

## 2015-11-09 DIAGNOSIS — M542 Cervicalgia: Secondary | ICD-10-CM | POA: Diagnosis not present

## 2015-11-11 DIAGNOSIS — Z23 Encounter for immunization: Secondary | ICD-10-CM | POA: Diagnosis not present

## 2015-11-14 ENCOUNTER — Ambulatory Visit
Admission: RE | Admit: 2015-11-14 | Discharge: 2015-11-14 | Disposition: A | Discharge status: Home / Self Care | Payer: Medicare Other | Source: Ambulatory Visit | Attending: Orthopedic Surgery | Admitting: Orthopedic Surgery

## 2015-11-14 DIAGNOSIS — S46012A Strain of muscle(s) and tendon(s) of the rotator cuff of left shoulder, initial encounter: Secondary | ICD-10-CM | POA: Diagnosis not present

## 2015-11-14 DIAGNOSIS — M25512 Pain in left shoulder: Secondary | ICD-10-CM

## 2015-11-15 DIAGNOSIS — M545 Low back pain: Secondary | ICD-10-CM | POA: Diagnosis not present

## 2015-11-15 DIAGNOSIS — M79605 Pain in left leg: Secondary | ICD-10-CM | POA: Diagnosis not present

## 2015-11-15 DIAGNOSIS — M542 Cervicalgia: Secondary | ICD-10-CM | POA: Diagnosis not present

## 2015-11-15 DIAGNOSIS — M25512 Pain in left shoulder: Secondary | ICD-10-CM | POA: Diagnosis not present

## 2015-11-17 DIAGNOSIS — M545 Low back pain: Secondary | ICD-10-CM | POA: Diagnosis not present

## 2015-11-17 DIAGNOSIS — M79605 Pain in left leg: Secondary | ICD-10-CM | POA: Diagnosis not present

## 2015-11-17 DIAGNOSIS — M25512 Pain in left shoulder: Secondary | ICD-10-CM | POA: Diagnosis not present

## 2015-11-17 DIAGNOSIS — M542 Cervicalgia: Secondary | ICD-10-CM | POA: Diagnosis not present

## 2015-11-22 DIAGNOSIS — H6123 Impacted cerumen, bilateral: Secondary | ICD-10-CM | POA: Diagnosis not present

## 2015-11-24 DIAGNOSIS — M542 Cervicalgia: Secondary | ICD-10-CM | POA: Diagnosis not present

## 2015-11-24 DIAGNOSIS — M79605 Pain in left leg: Secondary | ICD-10-CM | POA: Diagnosis not present

## 2015-11-24 DIAGNOSIS — M25512 Pain in left shoulder: Secondary | ICD-10-CM | POA: Diagnosis not present

## 2015-11-24 DIAGNOSIS — M545 Low back pain: Secondary | ICD-10-CM | POA: Diagnosis not present

## 2015-11-25 DIAGNOSIS — M7542 Impingement syndrome of left shoulder: Secondary | ICD-10-CM | POA: Diagnosis not present

## 2015-11-27 ENCOUNTER — Encounter: Payer: Self-pay | Admitting: Interventional Cardiology

## 2015-12-08 DIAGNOSIS — M542 Cervicalgia: Secondary | ICD-10-CM | POA: Diagnosis not present

## 2015-12-08 DIAGNOSIS — M25512 Pain in left shoulder: Secondary | ICD-10-CM | POA: Diagnosis not present

## 2015-12-08 DIAGNOSIS — M79605 Pain in left leg: Secondary | ICD-10-CM | POA: Diagnosis not present

## 2015-12-08 DIAGNOSIS — M545 Low back pain: Secondary | ICD-10-CM | POA: Diagnosis not present

## 2015-12-15 DIAGNOSIS — M7581 Other shoulder lesions, right shoulder: Secondary | ICD-10-CM | POA: Diagnosis not present

## 2015-12-20 DIAGNOSIS — M79605 Pain in left leg: Secondary | ICD-10-CM | POA: Diagnosis not present

## 2015-12-20 DIAGNOSIS — M25512 Pain in left shoulder: Secondary | ICD-10-CM | POA: Diagnosis not present

## 2015-12-20 DIAGNOSIS — M545 Low back pain: Secondary | ICD-10-CM | POA: Diagnosis not present

## 2015-12-20 DIAGNOSIS — M542 Cervicalgia: Secondary | ICD-10-CM | POA: Diagnosis not present

## 2015-12-28 DIAGNOSIS — M542 Cervicalgia: Secondary | ICD-10-CM | POA: Diagnosis not present

## 2015-12-28 DIAGNOSIS — M545 Low back pain: Secondary | ICD-10-CM | POA: Diagnosis not present

## 2015-12-28 DIAGNOSIS — M25512 Pain in left shoulder: Secondary | ICD-10-CM | POA: Diagnosis not present

## 2015-12-28 DIAGNOSIS — M79605 Pain in left leg: Secondary | ICD-10-CM | POA: Diagnosis not present

## 2016-01-05 DIAGNOSIS — M79605 Pain in left leg: Secondary | ICD-10-CM | POA: Diagnosis not present

## 2016-01-05 DIAGNOSIS — M25512 Pain in left shoulder: Secondary | ICD-10-CM | POA: Diagnosis not present

## 2016-01-05 DIAGNOSIS — M545 Low back pain: Secondary | ICD-10-CM | POA: Diagnosis not present

## 2016-01-05 DIAGNOSIS — M542 Cervicalgia: Secondary | ICD-10-CM | POA: Diagnosis not present

## 2016-01-09 DIAGNOSIS — M542 Cervicalgia: Secondary | ICD-10-CM | POA: Diagnosis not present

## 2016-01-09 DIAGNOSIS — M79605 Pain in left leg: Secondary | ICD-10-CM | POA: Diagnosis not present

## 2016-01-09 DIAGNOSIS — M25512 Pain in left shoulder: Secondary | ICD-10-CM | POA: Diagnosis not present

## 2016-01-09 DIAGNOSIS — M545 Low back pain: Secondary | ICD-10-CM | POA: Diagnosis not present

## 2016-01-18 DIAGNOSIS — M542 Cervicalgia: Secondary | ICD-10-CM | POA: Diagnosis not present

## 2016-01-18 DIAGNOSIS — M79605 Pain in left leg: Secondary | ICD-10-CM | POA: Diagnosis not present

## 2016-01-18 DIAGNOSIS — M25512 Pain in left shoulder: Secondary | ICD-10-CM | POA: Diagnosis not present

## 2016-01-18 DIAGNOSIS — M545 Low back pain: Secondary | ICD-10-CM | POA: Diagnosis not present

## 2016-01-24 ENCOUNTER — Other Ambulatory Visit: Payer: Medicare Other

## 2016-01-30 DIAGNOSIS — M545 Low back pain: Secondary | ICD-10-CM | POA: Diagnosis not present

## 2016-01-30 DIAGNOSIS — M542 Cervicalgia: Secondary | ICD-10-CM | POA: Diagnosis not present

## 2016-01-30 DIAGNOSIS — M25512 Pain in left shoulder: Secondary | ICD-10-CM | POA: Diagnosis not present

## 2016-01-30 DIAGNOSIS — M79605 Pain in left leg: Secondary | ICD-10-CM | POA: Diagnosis not present

## 2016-01-31 ENCOUNTER — Encounter: Payer: Self-pay | Admitting: Interventional Cardiology

## 2016-01-31 ENCOUNTER — Other Ambulatory Visit: Payer: Medicare Other

## 2016-01-31 ENCOUNTER — Ambulatory Visit: Payer: Medicare Other

## 2016-02-01 DIAGNOSIS — M25512 Pain in left shoulder: Secondary | ICD-10-CM | POA: Diagnosis not present

## 2016-02-01 DIAGNOSIS — M542 Cervicalgia: Secondary | ICD-10-CM | POA: Diagnosis not present

## 2016-02-01 DIAGNOSIS — M545 Low back pain: Secondary | ICD-10-CM | POA: Diagnosis not present

## 2016-02-01 DIAGNOSIS — M79605 Pain in left leg: Secondary | ICD-10-CM | POA: Diagnosis not present

## 2016-02-05 NOTE — Progress Notes (Signed)
Patient ID: Noah [name], male   DOB: [birth year], 65 y.o.   MRN: [id number]     Cardiology Office Note   Date:  02/07/2016   ID:  Noah [name], DOB [birth year], MRN [id number]  PCP:  Melinda Crutch, MD    No chief complaint on file. f/u CAD   Wt Readings from Last 3 Encounters:  02/07/16 176 lb (79.8 kg)  09/20/15 177 lb (80.3 kg)  04/13/15 176 lb (79.8 kg)       History of Present Illness: Noah [name] is a 65 y.o. male  who has had CAD. He had CABG in 2007.   He continues to exercise regularly. He does 30 minutes on the bike daily while in town.  He has had short runs of AFib in the past. Palpitations had stopped since he stopped fish oil due to prostate cancer concerns. BP has been controlled at home. He has occasional PACs and PVCs.   In January 2017, he had a severe left eye infection.  THis cleared after drops and then oral doxycycline and valacyclovir.  Sx improved.  The appearance of his left eye improved. He had use a cream on the left eye as well.   He had  A  reaction to a shoulder injection in Feb 2017.  No other cardiac issues.  THere was arm numbness, tingling on his head.  He felt he was intolerant to one generic Crestor. Insurance company has denied him brand name Crestor.  Tolerating rosuvastatin made by [name].  In the past, he had arm muscle pains with lipitor, brand name.  He did not tolerate generic atorvastatin since then.    He has had another episode when he turned his head a certain way, and then had some tingling in the top of his head.  He has not passed out.  He has a left rotator cuff that will heal on its own.  He now has right shoulder pain.    He had an ocular migraine- saw a rainbow across his eye transiently.    He would like the name of the neurologist that he could see if he has more tingling in his head.  He thinks he is doing well from a cardiac standpoint.  CAD/ASCVD:  Denies : Chest pain. Dyspnea on exertion. Leg edema.  Nitroglycerin use. Orthopnea.   He rides his bike typically 5 days a week and has no trouble with shortness of breath.    Past Medical History:  Diagnosis Date  . Anxiety   . Arthritis   . Coronary artery disease 07/13/2005   CABG  . DDD (degenerative disc disease)   . DJD (degenerative joint disease)   . Elevated liver enzymes   . Hemangioma of liver   . Hyperlipemia   . PAC (premature atrial contraction)   . Paroxysmal atrial fibrillation (HCC)   . Pneumonia   . PVC (premature ventricular contraction)     Past Surgical History:  Procedure Laterality Date  . CORONARY ARTERY BYPASS GRAFT  07/13/2005  . INGUINAL HERNIA REPAIR Right 1978, 2000   x 2  . KNEE SURGERY Right      Current Outpatient Prescriptions  Medication Sig Dispense Refill  . acetaminophen (TYLENOL) 325 MG tablet Take 650 mg by mouth every 6 (six) hours as needed for pain.    Marland Kitchen aspirin 325 MG tablet Take 325 mg by mouth daily.    . Cholecalciferol (VITAMIN D) 2000 UNITS tablet Take 2,000 Units by mouth daily.    Marland Kitchen  Flaxseed, Linseed, POWD Take 2 scoop by mouth as directed.    Marland Kitchen LORazepam (ATIVAN) 0.5 MG tablet Take 1 tablet by mouth at bedtime as needed for sleep. To help with sleep    . metoprolol succinate (TOPROL XL) 25 MG 24 hr tablet Take 1 tablet (25 mg total) by mouth 2 (two) times daily with a meal. 180 tablet 3  . metoprolol tartrate (LOPRESSOR) 25 MG tablet Take 25 mg by mouth daily as needed.    . Multiple Vitamin (MULTIVITAMIN WITH MINERALS) TABS Take 1 tablet by mouth daily. Centrum silver    . nitroGLYCERIN (NITROSTAT) 0.4 MG SL tablet Place 0.4 mg under the tongue every 5 (five) minutes as needed for chest pain (3 DOSES MAX).    Marland Kitchen pramoxine-hydrocortisone (ANALPRAM HC) cream Apply 1 application topically at bedtime as needed (for skin).     . Psyllium (METAMUCIL PO) 4 tablespoons by mouth daily as needed, for consitpation    . Ubiquinol 100 MG CAPS Take 100 mg by mouth daily.    . rosuvastatin  (CRESTOR) 20 MG tablet Take 1 tablet (20 mg total) by mouth daily. 90 tablet 3   No current facility-administered medications for this visit.     Allergies:   Cephalexin; Levofloxacin; Lipitor [atorvastatin]; and Penicillins    Social History:  The patient  reports that he quit smoking about 41 years ago. His smoking use included Cigarettes. He has never used smokeless tobacco. He reports that he drinks alcohol. He reports that he does not use drugs.   Family History:  The patient's family history includes Atrial fibrillation (age of onset: 56) in his mother; Colon polyps in his mother; Coronary artery disease in his father; Heart attack in his father, maternal grandfather, paternal grandfather, and paternal grandmother; Prostate cancer in his maternal uncle; Seizures in his brother.    ROS:  Please see the history of present illness.   Otherwise, review of systems are positive for head tingling.   All other systems are reviewed and negative.    PHYSICAL EXAM: VS:  BP 126/72   Pulse 84   Ht 5\' 8"  (1.727 m)   Wt 176 lb (79.8 kg)   BMI 26.76 kg/m  , BMI Body mass index is 26.76 kg/m. GEN: Well nourished, well developed, in no acute distress  HEENT: normal  Neck: no JVD, carotid bruits, or masses Cardiac: RRR; no murmurs, rubs, or gallops,no edema  Respiratory:  clear to auscultation bilaterally, normal work of breathing GI: soft, nontender, nondistended, + BS MS: no deformity or atrophy  Skin: warm and dry, no rash Neuro:  Strength and sensation are intact Psych: euthymic mood, full affect    Recent Labs: 09/20/2015: ALT 34; BUN 14; Creat 1.03; Potassium 5.3; Sodium 140   Lipid Panel    Component Value Date/Time   CHOL 138 08/02/2015 0902   CHOL 124 07/28/2014 0903   TRIG 190 (H) 08/02/2015 0902   TRIG 112 07/28/2014 0903   HDL 49 08/02/2015 0902   HDL 46 07/28/2014 0903   CHOLHDL 2.8 08/02/2015 0902   LDLCALC 51 08/02/2015 0902   LDLCALC 56 07/28/2014 0903     Other  studies Reviewed: Additional studies/ records that were reviewed today with results demonstrating: Normal LV function in 1/16. ECG: NSR with inferior T wave changes   ASSESSMENT AND PLAN:  1. CAD: No angina. Some atypical left shoulder pains. No exercise induced symptoms.  Legs feel okay with exercise. He has no symptoms with vigorous bike  riding.  Tingling in head is not related to his heart. We did give the name of a neurologist and he will call if he has more symptoms. 2. hyperlipidemia: Continue Crestor. He was getting the brand name Crestor but very expensive- he has had problems with generics in the past. Follow-up in the lipid clinic as well.  Tolerating his meds well.  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  He has to take his plant stanols, such as benechol.   Could consider Zetia.  Continue metamucil.  B pattern lipids makes him nervous.  He has had difficulty getting brand name Crestor.  Tolerating generic rosuvastatin made by the [facility]. 3. Muscle pains in his quadriceps and left shoulder are improved. 4. Hypertension: Blood pressure well controlled. Continue current medicines. No recent spikes in his blood pressure. 5. He will be following up with the eye doctors as well.  PAF in the past.  No sustained palpitations.     Current medicines are reviewed at length with the patient today.  The patient concerns regarding his medicines were addressed.  The following changes have been made:  No change  Labs/ tests ordered today include:  No orders of the defined types were placed in this encounter.   Recommend 150 minutes/week of aerobic exercise Low fat, low carb, high fiber diet recommended  Disposition:   FU in 6 months   Signed, Larae Grooms, MD  02/07/2016 11:44 AM    [facility] [address], [place], [place]  [address] Phone: [phone]; Fax: [phone]

## 2016-02-06 DIAGNOSIS — M545 Low back pain: Secondary | ICD-10-CM | POA: Diagnosis not present

## 2016-02-06 DIAGNOSIS — M79605 Pain in left leg: Secondary | ICD-10-CM | POA: Diagnosis not present

## 2016-02-06 DIAGNOSIS — M542 Cervicalgia: Secondary | ICD-10-CM | POA: Diagnosis not present

## 2016-02-06 DIAGNOSIS — M25512 Pain in left shoulder: Secondary | ICD-10-CM | POA: Diagnosis not present

## 2016-02-07 ENCOUNTER — Ambulatory Visit: Payer: Medicare Other

## 2016-02-07 ENCOUNTER — Ambulatory Visit (INDEPENDENT_AMBULATORY_CARE_PROVIDER_SITE_OTHER): Payer: Medicare Other | Admitting: Interventional Cardiology

## 2016-02-07 ENCOUNTER — Encounter: Payer: Self-pay | Admitting: Interventional Cardiology

## 2016-02-07 VITALS — BP 126/72 | HR 84 | Ht 68.0 in | Wt 176.0 lb

## 2016-02-07 DIAGNOSIS — I491 Atrial premature depolarization: Secondary | ICD-10-CM

## 2016-02-07 DIAGNOSIS — I2510 Atherosclerotic heart disease of native coronary artery without angina pectoris: Secondary | ICD-10-CM

## 2016-02-07 DIAGNOSIS — E782 Mixed hyperlipidemia: Secondary | ICD-10-CM

## 2016-02-07 DIAGNOSIS — I6529 Occlusion and stenosis of unspecified carotid artery: Secondary | ICD-10-CM

## 2016-02-07 DIAGNOSIS — I10 Essential (primary) hypertension: Secondary | ICD-10-CM

## 2016-02-07 DIAGNOSIS — R202 Paresthesia of skin: Secondary | ICD-10-CM

## 2016-02-07 NOTE — Patient Instructions (Signed)
Medication Instructions:  Same-no changes  Labwork: None  Testing/Procedures: None  Follow-Up: Your physician wants you to follow-up in: 6 months. You will receive a reminder letter in the mail two months in advance. If you don't receive a letter, please call our office to schedule the follow-up appointment.      If you need a refill on your cardiac medications before your next appointment, please call your pharmacy.   

## 2016-02-09 ENCOUNTER — Other Ambulatory Visit: Payer: Medicare Other | Admitting: *Deleted

## 2016-02-09 ENCOUNTER — Other Ambulatory Visit: Payer: Self-pay | Admitting: Pharmacist

## 2016-02-09 DIAGNOSIS — E782 Mixed hyperlipidemia: Secondary | ICD-10-CM | POA: Diagnosis not present

## 2016-02-09 DIAGNOSIS — E785 Hyperlipidemia, unspecified: Secondary | ICD-10-CM

## 2016-02-09 DIAGNOSIS — I480 Paroxysmal atrial fibrillation: Secondary | ICD-10-CM | POA: Diagnosis not present

## 2016-02-09 LAB — GLUCOSE, RANDOM: Glucose, Bld: 82 mg/dL (ref 65–99)

## 2016-02-09 NOTE — Addendum Note (Signed)
Addended by: Eulis Foster on: 02/09/2016 12:05 PM   Modules accepted: Orders

## 2016-02-09 NOTE — Addendum Note (Signed)
Addended by: Eulis Foster on: 02/09/2016 12:06 PM   Modules accepted: Orders

## 2016-02-09 NOTE — Addendum Note (Signed)
Addended by: Eulis Foster on: 02/09/2016 11:55 AM   Modules accepted: Orders

## 2016-02-10 DIAGNOSIS — M25512 Pain in left shoulder: Secondary | ICD-10-CM | POA: Diagnosis not present

## 2016-02-10 DIAGNOSIS — M545 Low back pain: Secondary | ICD-10-CM | POA: Diagnosis not present

## 2016-02-10 DIAGNOSIS — M542 Cervicalgia: Secondary | ICD-10-CM | POA: Diagnosis not present

## 2016-02-10 DIAGNOSIS — M79605 Pain in left leg: Secondary | ICD-10-CM | POA: Diagnosis not present

## 2016-02-16 DIAGNOSIS — M545 Low back pain: Secondary | ICD-10-CM | POA: Diagnosis not present

## 2016-02-16 DIAGNOSIS — M542 Cervicalgia: Secondary | ICD-10-CM | POA: Diagnosis not present

## 2016-02-16 DIAGNOSIS — M79605 Pain in left leg: Secondary | ICD-10-CM | POA: Diagnosis not present

## 2016-02-16 DIAGNOSIS — M25512 Pain in left shoulder: Secondary | ICD-10-CM | POA: Diagnosis not present

## 2016-02-16 LAB — CARDIO IQ(R) ADVANCED LIPID PANEL
Apolipoprotein B: 67 mg/dL (ref 52–109)
Cholesterol, Total: 121 mg/dL (ref <200)
Cholesterol/HDL Ratio: 2.5 calc (ref <5.0)
HDL CHOLESTEROL (CARDIO IQ ADV LIPID PANEL): 48 mg/dL (ref >40)
LDL CHOLESTEROL CALCULATED (CARDIO IQ ADV LIPID PANEL): 53 mg/dL (ref <100)
LDL LARGE: 5185 nmol/L (ref 4334–10815)
LDL MEDIUM: 200 nmol/L (ref 167–465)
LDL Particle Number: 1066 nmol/L (ref 1016–2185)
LDL Peak Size: 211.5 Angstrom — ABNORMAL LOW (ref >=218.2)
LDL SMALL: 231 nmol/L (ref 123–441)
Lipoprotein (a): 10 nmol/L (ref <75)
Non-HDL Cholesterol: 73 mg/dL (calc) (ref <130)
Triglycerides: 112 mg/dL (ref <150)

## 2016-02-17 ENCOUNTER — Ambulatory Visit: Payer: Medicare Other

## 2016-02-17 ENCOUNTER — Telehealth: Payer: Self-pay | Admitting: Interventional Cardiology

## 2016-02-17 DIAGNOSIS — E7849 Other hyperlipidemia: Secondary | ICD-10-CM

## 2016-02-17 NOTE — Telephone Encounter (Signed)
New message:    Pt had an appt today,he can not come, he is sick. Pt would like for you to call him please.

## 2016-02-17 NOTE — Telephone Encounter (Signed)
Called pt and reviewed [facility] lipid results over the phone. All labs stable and pt will continue current therapy. He would like labs checked again in 6 months - these have been ordered.

## 2016-02-18 DIAGNOSIS — J069 Acute upper respiratory infection, unspecified: Secondary | ICD-10-CM | POA: Diagnosis not present

## 2016-02-27 DIAGNOSIS — M542 Cervicalgia: Secondary | ICD-10-CM | POA: Diagnosis not present

## 2016-02-27 DIAGNOSIS — M25512 Pain in left shoulder: Secondary | ICD-10-CM | POA: Diagnosis not present

## 2016-02-27 DIAGNOSIS — M79605 Pain in left leg: Secondary | ICD-10-CM | POA: Diagnosis not present

## 2016-02-27 DIAGNOSIS — M545 Low back pain: Secondary | ICD-10-CM | POA: Diagnosis not present

## 2016-03-01 DIAGNOSIS — M25512 Pain in left shoulder: Secondary | ICD-10-CM | POA: Diagnosis not present

## 2016-03-01 DIAGNOSIS — M79605 Pain in left leg: Secondary | ICD-10-CM | POA: Diagnosis not present

## 2016-03-01 DIAGNOSIS — M542 Cervicalgia: Secondary | ICD-10-CM | POA: Diagnosis not present

## 2016-03-01 DIAGNOSIS — M545 Low back pain: Secondary | ICD-10-CM | POA: Diagnosis not present

## 2016-03-05 DIAGNOSIS — M79605 Pain in left leg: Secondary | ICD-10-CM | POA: Diagnosis not present

## 2016-03-05 DIAGNOSIS — M542 Cervicalgia: Secondary | ICD-10-CM | POA: Diagnosis not present

## 2016-03-05 DIAGNOSIS — M25512 Pain in left shoulder: Secondary | ICD-10-CM | POA: Diagnosis not present

## 2016-03-05 DIAGNOSIS — M545 Low back pain: Secondary | ICD-10-CM | POA: Diagnosis not present

## 2016-03-12 DIAGNOSIS — M25512 Pain in left shoulder: Secondary | ICD-10-CM | POA: Diagnosis not present

## 2016-03-12 DIAGNOSIS — M545 Low back pain: Secondary | ICD-10-CM | POA: Diagnosis not present

## 2016-03-12 DIAGNOSIS — M79605 Pain in left leg: Secondary | ICD-10-CM | POA: Diagnosis not present

## 2016-03-12 DIAGNOSIS — M542 Cervicalgia: Secondary | ICD-10-CM | POA: Diagnosis not present

## 2016-03-15 DIAGNOSIS — M25512 Pain in left shoulder: Secondary | ICD-10-CM | POA: Diagnosis not present

## 2016-03-15 DIAGNOSIS — M79605 Pain in left leg: Secondary | ICD-10-CM | POA: Diagnosis not present

## 2016-03-15 DIAGNOSIS — M542 Cervicalgia: Secondary | ICD-10-CM | POA: Diagnosis not present

## 2016-03-15 DIAGNOSIS — M545 Low back pain: Secondary | ICD-10-CM | POA: Diagnosis not present

## 2016-03-19 DIAGNOSIS — M25512 Pain in left shoulder: Secondary | ICD-10-CM | POA: Diagnosis not present

## 2016-03-19 DIAGNOSIS — M542 Cervicalgia: Secondary | ICD-10-CM | POA: Diagnosis not present

## 2016-03-19 DIAGNOSIS — M79605 Pain in left leg: Secondary | ICD-10-CM | POA: Diagnosis not present

## 2016-03-19 DIAGNOSIS — M545 Low back pain: Secondary | ICD-10-CM | POA: Diagnosis not present

## 2016-03-20 ENCOUNTER — Ambulatory Visit: Payer: Medicare Other | Admitting: Interventional Cardiology

## 2016-03-21 DIAGNOSIS — M542 Cervicalgia: Secondary | ICD-10-CM | POA: Diagnosis not present

## 2016-03-21 DIAGNOSIS — M79605 Pain in left leg: Secondary | ICD-10-CM | POA: Diagnosis not present

## 2016-03-21 DIAGNOSIS — M545 Low back pain: Secondary | ICD-10-CM | POA: Diagnosis not present

## 2016-03-21 DIAGNOSIS — M25512 Pain in left shoulder: Secondary | ICD-10-CM | POA: Diagnosis not present

## 2016-04-10 DIAGNOSIS — H43391 Other vitreous opacities, right eye: Secondary | ICD-10-CM | POA: Diagnosis not present

## 2016-04-10 DIAGNOSIS — H43813 Vitreous degeneration, bilateral: Secondary | ICD-10-CM | POA: Diagnosis not present

## 2016-04-10 DIAGNOSIS — H35371 Puckering of macula, right eye: Secondary | ICD-10-CM | POA: Diagnosis not present

## 2016-04-10 DIAGNOSIS — H35341 Macular cyst, hole, or pseudohole, right eye: Secondary | ICD-10-CM | POA: Diagnosis not present

## 2016-04-20 DIAGNOSIS — M1712 Unilateral primary osteoarthritis, left knee: Secondary | ICD-10-CM | POA: Diagnosis not present

## 2016-04-20 DIAGNOSIS — M5412 Radiculopathy, cervical region: Secondary | ICD-10-CM | POA: Diagnosis not present

## 2016-04-20 DIAGNOSIS — M65352 Trigger finger, left little finger: Secondary | ICD-10-CM | POA: Diagnosis not present

## 2016-05-01 DIAGNOSIS — B009 Herpesviral infection, unspecified: Secondary | ICD-10-CM | POA: Diagnosis not present

## 2016-05-01 DIAGNOSIS — M1712 Unilateral primary osteoarthritis, left knee: Secondary | ICD-10-CM | POA: Diagnosis not present

## 2016-05-01 DIAGNOSIS — F419 Anxiety disorder, unspecified: Secondary | ICD-10-CM | POA: Diagnosis not present

## 2016-05-08 DIAGNOSIS — M1711 Unilateral primary osteoarthritis, right knee: Secondary | ICD-10-CM | POA: Diagnosis not present

## 2016-05-21 ENCOUNTER — Other Ambulatory Visit: Payer: Self-pay | Admitting: Interventional Cardiology

## 2016-05-24 ENCOUNTER — Telehealth: Payer: Self-pay | Admitting: Interventional Cardiology

## 2016-05-24 NOTE — Telephone Encounter (Signed)
Left message for patient to call back  

## 2016-05-24 NOTE — Telephone Encounter (Signed)
New message    Noah [name] is calling to ask if a prescription manufacturer can be changed. Dr. Irish Lack wrote it for [name], and they have been discountinued. They want to change it to Dr. Eden Emms.

## 2016-05-24 NOTE — Telephone Encounter (Signed)
Patient's pharmacy calling and states that the patient's metoprolol is written [name] manufacturer only. [name] is no longer available.They are asking if it is okay to switch to Dr. Ephriam Jenkins instead. Please advise.

## 2016-05-24 NOTE — Telephone Encounter (Signed)
The patient had a reaction to generic Crestor I believe but [name] was ok with him.  Please check with him regarding Dr. Ephriam Jenkins- if he has had a problem with this.

## 2016-05-25 NOTE — Telephone Encounter (Signed)
Noah [name] is returning your call.  If unable to reach im on his number please call his wife's number [phone] . Thanks

## 2016-05-25 NOTE — Telephone Encounter (Signed)
Pharmacy tech from [facility] left a msg on the refill vm in regards to the metoprolol from [name] manufacturer being discontinued. Call back number left [phone] and order number [id number]. Thanks, MI

## 2016-05-28 ENCOUNTER — Encounter: Payer: Self-pay | Admitting: Interventional Cardiology

## 2016-05-28 NOTE — Telephone Encounter (Signed)
New message    [facility] about pt metoprolol. Pharmacist states they can not guarantee that medication is manufactured in [place] now or in the future. He said the pt's options are Dr. Ephriam Jenkins, Jeni Salles, [place].

## 2016-05-28 NOTE — Telephone Encounter (Signed)
Follow up ° ° ° ° ° °Returning a call to the nurse °

## 2016-05-28 NOTE — Telephone Encounter (Signed)
Follow up       Pt c/o medication issue:  1. Name of Medication:  metoprolol 2. How are you currently taking this medication (dosage and times per day)?  25mg   3. Are you having a reaction (difficulty breathing--STAT)?  no  4. What is your medication issue?  Specific manufacturer has been discontinued.  The new manufacturer is dr reddys.  Please call to ok new manufacturer.  Use reference number [id number] when calling

## 2016-05-28 NOTE — Telephone Encounter (Signed)
Spoke with [place] at [employer] and made her aware that pt does not want to use Dr. Reddy's MFR as this is based in [place].  She then transferred me to a RPH.  Made Jeani Hawking, [facility] aware that pt prefers American or [place] based MFR.  She states she will send message to NAS dept and have them look over information.

## 2016-05-28 NOTE — Telephone Encounter (Signed)
Spoke with pt and he looked up Dr. Ephriam Jenkins and does not want to use this MFR as they are in [place].  Pt states when he had the original reactions it was due to receiving generic medications he obtained from [place].  Pt prefers an [place] or [place] based MFR.  Advised our office would contact pharmacy and make them aware so they can find a suitable MFR.

## 2016-05-28 NOTE — Telephone Encounter (Signed)
Informed pt of information provided by pharmacy.  He will look into these MFRs and call us back with a decision.

## 2016-05-29 ENCOUNTER — Telehealth: Payer: Self-pay | Admitting: *Deleted

## 2016-05-29 ENCOUNTER — Telehealth: Payer: Self-pay | Admitting: Interventional Cardiology

## 2016-05-29 NOTE — Telephone Encounter (Signed)
called pharmacy back, they wanted a confirmation on MFR for Metoprolol. Confirmed per RX that was sent. Spoke with Jamas Lav.  [name] MFR was D/C'd, told Jamas Lav to call and ask the pt his take on a new MFR as Mr Cornell Barman request the [facility] himself.

## 2016-05-29 NOTE — Telephone Encounter (Signed)
Follow up      Pharmacy calling to see which manufacturer we want to refill pt's metoprolol.   The choices are Dr Reece Levy, Jeni Salles and [place].  Pharmacy cannot guarantee it is all american made.  Please call so that they can fill the presc

## 2016-05-29 NOTE — Telephone Encounter (Signed)
Mr.Korn is asking that you give him a call. Thanks

## 2016-05-29 NOTE — Telephone Encounter (Signed)
Spoke to Noah [name] & he informed me that [facility] to him that they still MFR [name], I informed him to call them and ask for his medication to be sent. If they dont have it which he will confirm then we will send [facility] as his choice. He will call us back if needed.

## 2016-05-31 ENCOUNTER — Telehealth: Payer: Self-pay | Admitting: Interventional Cardiology

## 2016-05-31 DIAGNOSIS — H6982 Other specified disorders of Eustachian tube, left ear: Secondary | ICD-10-CM | POA: Diagnosis not present

## 2016-05-31 DIAGNOSIS — J358 Other chronic diseases of tonsils and adenoids: Secondary | ICD-10-CM | POA: Diagnosis not present

## 2016-05-31 DIAGNOSIS — J040 Acute laryngitis: Secondary | ICD-10-CM | POA: Diagnosis not present

## 2016-05-31 DIAGNOSIS — R0982 Postnasal drip: Secondary | ICD-10-CM | POA: Diagnosis not present

## 2016-05-31 DIAGNOSIS — J029 Acute pharyngitis, unspecified: Secondary | ICD-10-CM | POA: Diagnosis not present

## 2016-05-31 NOTE — Telephone Encounter (Signed)
Telephone   05/31/2016 [place] [facility]  [name], MD  Cardiology   Conversation  (Newest Message First)  Cleon Gustin, RN      05/31/16 11:25 AM  Note    Spoke with Jenness Corner at [employer] and informed him that the patient is okay with using [place] for his metoprolol since it is based out of the [place] unless there is [name] available. Transferred to Ronalee Belts, [facility] and made him aware of the same information. He verbalized understanding.          05/31/16 11:24 AM    Cleon Gustin, RN contacted [place], RN      05/31/16 11:17 AM  Note    Patient calling back and states that he has made several phone calls about this. He states that he was told that [name] was on back order. Patient states that he is willing to use [facility] since it is based out of the [place] unless there is [name] available. I notified the patient that I would let [facility] know. Patient verbalized understanding.          05/31/16 11:17 AM    [name] contacted Cleon Gustin, RN  Cleon Gustin, RN      05/31/16 11:15 AM  Note    Left message for patient to call back.          05/31/16 11:14 AM    Cleon Gustin, RN attempted to contact [name] (Left Message)  Cleon Gustin, RN      05/31/16 11:11 AM  Note    Spoke with Bridgette at [employer] and informed her that we are still waiting on the patient to make a decision. I informed her that I will call the patient and see if he has made a decision and call them back to let them know. Bridgette verbalized understanding.          05/31/16 11:11 AM    Cleon Gustin, RN contacted [facility]         05/31/16 10:16 AM  Ashland Sammuel Bailiff routed this conversation to Cleon Gustin, RN  Ashland Sammuel Bailiff      05/31/16 10:14 AM  Note    New message    Mardene Celeste from [employer] is calling asking if the doctor will approve a new  manufacturer. [name] has been discontinued. She said there is Dr. Reece Levy, Jeni Salles, or [facility] is available.    Mardene Celeste [facility]  to Wayland Sammuel Bailiff       05/31/16 10:13 AM  ref [id number]  Additional Documentation   Encounter Info:   Billing Info,   History,   Allergies,   Detailed Report

## 2016-05-31 NOTE — Telephone Encounter (Signed)
New message    Mardene Celeste from [employer] is calling asking if the doctor will approve a new manufacturer. [name] has been discontinued. She said there is Dr. Reece Levy, Jeni Salles, or [facility] is available.

## 2016-05-31 NOTE — Telephone Encounter (Signed)
Left message for patient to call back  

## 2016-05-31 NOTE — Telephone Encounter (Signed)
Spoke with Bridgette at [employer] and informed her that we are still waiting on the patient to make a decision. I informed her that I will call the patient and see if he has made a decision and call them back to let them know. Bridgette verbalized understanding.

## 2016-05-31 NOTE — Telephone Encounter (Signed)
Patient calling back and states that he has made several phone calls about this. He states that he was told that [name] was on back order. Patient states that he is willing to use [employer] since it is based out of the [place] unless there is [name] available. I notified the patient that I would let [facility] know. Patient verbalized understanding.

## 2016-05-31 NOTE — Telephone Encounter (Signed)
Spoke with Jenness Corner at [employer] and informed him that the patient is okay with using [facility] for his metoprolol since it is based out of the [place] unless there is [name] available. Transferred to Ronalee Belts, [facility] and made him aware of the same information. He verbalized understanding.

## 2016-06-04 ENCOUNTER — Other Ambulatory Visit: Payer: Self-pay | Admitting: *Deleted

## 2016-06-04 ENCOUNTER — Telehealth: Payer: Self-pay | Admitting: Interventional Cardiology

## 2016-06-04 NOTE — Telephone Encounter (Signed)
PT AWARE PHARMACY IS  DISPENSING  TODAY  SHOULD  RECEIVE   IN 3-4 DAYS .Adonis Housekeeper

## 2016-06-04 NOTE — Telephone Encounter (Signed)
New message     Pt is returning your call from Friday

## 2016-06-05 DIAGNOSIS — J329 Chronic sinusitis, unspecified: Secondary | ICD-10-CM | POA: Diagnosis not present

## 2016-06-05 DIAGNOSIS — H109 Unspecified conjunctivitis: Secondary | ICD-10-CM | POA: Diagnosis not present

## 2016-06-21 ENCOUNTER — Encounter: Payer: Self-pay | Admitting: Interventional Cardiology

## 2016-06-22 DIAGNOSIS — M65352 Trigger finger, left little finger: Secondary | ICD-10-CM | POA: Diagnosis not present

## 2016-06-28 DIAGNOSIS — J301 Allergic rhinitis due to pollen: Secondary | ICD-10-CM | POA: Diagnosis not present

## 2016-06-28 DIAGNOSIS — K219 Gastro-esophageal reflux disease without esophagitis: Secondary | ICD-10-CM | POA: Diagnosis not present

## 2016-06-28 DIAGNOSIS — R05 Cough: Secondary | ICD-10-CM | POA: Diagnosis not present

## 2016-06-28 DIAGNOSIS — J3089 Other allergic rhinitis: Secondary | ICD-10-CM | POA: Diagnosis not present

## 2016-07-02 ENCOUNTER — Telehealth: Payer: Self-pay | Admitting: Interventional Cardiology

## 2016-07-02 NOTE — Telephone Encounter (Signed)
Will route to [facility], [facility] as pt has been communicating with her in regards to [facility] appt.

## 2016-07-02 NOTE — Telephone Encounter (Signed)
Pt has been scheduled in lipid clinic.

## 2016-07-02 NOTE — Telephone Encounter (Signed)
Patient calling to reschedule appointments and patient requested an appt with the pharmacist to go over lab results and would like to come in on 08-07-16. I informed patient that I would send message back in regards to his appt request.

## 2016-07-06 DIAGNOSIS — L814 Other melanin hyperpigmentation: Secondary | ICD-10-CM | POA: Diagnosis not present

## 2016-07-06 DIAGNOSIS — L918 Other hypertrophic disorders of the skin: Secondary | ICD-10-CM | POA: Diagnosis not present

## 2016-07-06 DIAGNOSIS — L821 Other seborrheic keratosis: Secondary | ICD-10-CM | POA: Diagnosis not present

## 2016-07-06 DIAGNOSIS — L853 Xerosis cutis: Secondary | ICD-10-CM | POA: Diagnosis not present

## 2016-07-06 DIAGNOSIS — L570 Actinic keratosis: Secondary | ICD-10-CM | POA: Diagnosis not present

## 2016-07-06 DIAGNOSIS — D1801 Hemangioma of skin and subcutaneous tissue: Secondary | ICD-10-CM | POA: Diagnosis not present

## 2016-07-17 ENCOUNTER — Other Ambulatory Visit: Payer: Self-pay | Admitting: *Deleted

## 2016-07-17 ENCOUNTER — Encounter: Payer: Self-pay | Admitting: Interventional Cardiology

## 2016-07-17 MED ORDER — ROSUVASTATIN CALCIUM 20 MG PO TABS: 20.0000 mg | ORAL_TABLET | Freq: Every day | ORAL | 1 refills | Status: DC

## 2016-08-02 ENCOUNTER — Other Ambulatory Visit: Payer: Medicare Other | Admitting: *Deleted

## 2016-08-02 DIAGNOSIS — E782 Mixed hyperlipidemia: Secondary | ICD-10-CM

## 2016-08-02 DIAGNOSIS — I10 Essential (primary) hypertension: Secondary | ICD-10-CM

## 2016-08-03 ENCOUNTER — Other Ambulatory Visit: Payer: Medicare Other

## 2016-08-03 LAB — HEPATIC FUNCTION PANEL
ALT: 27 IU/L (ref 0–44)
AST: 30 IU/L (ref 0–40)
Albumin: 4.4 g/dL (ref 3.6–4.8)
Alkaline Phosphatase: 81 IU/L (ref 39–117)
BILIRUBIN TOTAL: 0.8 mg/dL (ref 0.0–1.2)
BILIRUBIN, DIRECT: 0.22 mg/dL (ref 0.00–0.40)
Total Protein: 7.3 g/dL (ref 6.0–8.5)

## 2016-08-03 LAB — NMR, LIPOPROFILE
Cholesterol: 128 mg/dL (ref 100–199)
HDL Cholesterol by NMR: 44 mg/dL (ref >39)
HDL Particle Number: 36.5 umol/L (ref >=30.5)
LDL PARTICLE NUMBER: 634 nmol/L (ref <1000)
LDL SIZE: 20.4 nm (ref >20.5)
LDL-C: 59 mg/dL (ref 0–99)
LP-IR SCORE: 44 (ref <=45)
SMALL LDL PARTICLE NUMBER: 364 nmol/L (ref <=527)
Triglycerides by NMR: 124 mg/dL (ref 0–149)

## 2016-08-03 LAB — APOLIPOPROTEIN A-1: APOLIPOPROTEIN A-1: 137 mg/dL (ref 101–178)

## 2016-08-03 LAB — APOLIPOPROTEIN B: Apolipoprotein B: 77 mg/dL (ref 52–135)

## 2016-08-06 DIAGNOSIS — M79604 Pain in right leg: Secondary | ICD-10-CM | POA: Diagnosis not present

## 2016-08-06 DIAGNOSIS — M542 Cervicalgia: Secondary | ICD-10-CM | POA: Diagnosis not present

## 2016-08-06 DIAGNOSIS — M25511 Pain in right shoulder: Secondary | ICD-10-CM | POA: Diagnosis not present

## 2016-08-06 DIAGNOSIS — M25551 Pain in right hip: Secondary | ICD-10-CM | POA: Diagnosis not present

## 2016-08-07 ENCOUNTER — Ambulatory Visit: Payer: Medicare Other

## 2016-08-07 ENCOUNTER — Ambulatory Visit (INDEPENDENT_AMBULATORY_CARE_PROVIDER_SITE_OTHER): Payer: Medicare Other | Admitting: Pharmacist

## 2016-08-07 DIAGNOSIS — E782 Mixed hyperlipidemia: Secondary | ICD-10-CM | POA: Diagnosis not present

## 2016-08-07 NOTE — Patient Instructions (Signed)
It was great to see you again!  No medication changes - keep up the great work!

## 2016-08-07 NOTE — Progress Notes (Signed)
Patient ID: [name]                 DOB: 10-31-1950                    MRN: [id number]     HPI: Noah [name] is a 66 y.o. male patient referred to lipid clinic by Dr. Irish [name] who is followed in the [facility] for his h/o elevated LDL-P and h/o CABG.  Patient has been working with the lipid clinic for years for management of his cholesterol.  His stress test was normal late 2013 showing no ischemia.  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    Patient presents in good spirits today for lipid follow-up. He had an advanced lipid panel on 08/02/16 that showed an improvement in LDL-particles and triglycerides, with a slight bump in LDL to 59 (still below goal).  He is complaint with all of his medications and no side effect issues. He has a weight loss of 10 lbs since last visit. He continues to bike 6.5-7 miles per day and do rubber band exercises.  Current Medications:  Rosuvastatin 20mg  daily Metamucil 4 tbsp daily prn   Intolerances: Lipitor 80mg  daily (myalgias), fish oil (stopped fish oil on own early 2013 due to report of possible prostate cancer with fish oil use) * Previously took and tolerated Benecol, but company stopped production   Risk Factors: CAD/CABG, HTN, age  LDL goal: < 70 mg/dL   Exercise: Rides bike 6.5-7 days per week.   Family History: The patient's family history includes Atrial fibrillation (age of onset: 64) in his mother; Colon polyps in his mother; Coronary artery disease in his father; Heart attack in his father, maternal grandfather, paternal grandfather, and paternal grandmother; Prostate cancer in his maternal uncle; Seizures in his brother.   Social History: The patient  reports that he quit smoking about 41 years ago. His smoking use included Cigarettes. He has never used smokeless tobacco. He reports that he drinks alcohol. He reports that he does not use drugs.  Labs: (07/2016): LDL-P 634, LDL 59, TG 124, HDL 44, ApoB 77, LFTs wnl (07/2015): LDL-P  1063, LDL 51, TG 190, HDL49, Lp(a) < 10, ApoB- 70, LFTs normal  (01/2015): LDL-P 1068, LDL 55, TG 136, HDL 48, Lp(a) <10, ApoB- 69, LFTs stable (07/2014): LDL-P 888, LDL 56, TG 112, HDL 46, LFTs normal  (11/2013): LDL-P 975, LDL 67, TG 115, HDL 47, ALT normal  (06/2013):  LDL-P number 1149, small LDL-P 792, LDL 58, TG 108, HDL 50, LFTs normal (Crestor 20 mg qd, Benechol chews bid, metamucil bid, flax seed) (12/2012):  LDL-P number 918 nmol/L (goal < 1000), small LDL-P 574, LDL 56 mg/dL (goal < 70), non-HDL 74 mg/dL (goal < 100).  TG 92, HDL 48 (Crestor 20 mg qd, Benecol chews bid, metamucil bid, flax seed).    Past Medical History:  Diagnosis Date  . Anxiety   . Arthritis   . Coronary artery disease 07/13/2005   CABG  . DDD (degenerative disc disease)   . DJD (degenerative joint disease)   . Elevated liver enzymes   . Hemangioma of liver   . Hyperlipemia   . PAC (premature atrial contraction)   . Paroxysmal atrial fibrillation (HCC)   . Pneumonia   . PVC (premature ventricular contraction)     Current Outpatient Prescriptions on File Prior to Visit  Medication Sig Dispense Refill  . acetaminophen (TYLENOL) 325 MG tablet Take 650 mg by  mouth every 6 (six) hours as needed for pain.    Marland Kitchen aspirin 325 MG tablet Take 325 mg by mouth daily.    . Cholecalciferol (VITAMIN D) 2000 UNITS tablet Take 2,000 Units by mouth daily.    . Flaxseed, Linseed, POWD Take 2 scoop by mouth as directed.    Marland Kitchen LORazepam (ATIVAN) 0.5 MG tablet Take 1 tablet by mouth at bedtime as needed for sleep. To help with sleep    . metoprolol succinate (TOPROL-XL) 25 MG 24 hr tablet TAKE 1 TABLET TWICE A DAY  WITH OR IMMEDIATELY        FOLLOWING MEALS**[name] MFR** 180 tablet 3  . metoprolol tartrate (LOPRESSOR) 25 MG tablet Take 25 mg by mouth daily as needed.    . Multiple Vitamin (MULTIVITAMIN WITH MINERALS) TABS Take 1 tablet by mouth daily. Centrum silver    . nitroGLYCERIN (NITROSTAT) 0.4 MG SL tablet Place 0.4 mg under  the tongue every 5 (five) minutes as needed for chest pain (3 DOSES MAX).    Marland Kitchen pramoxine-hydrocortisone (ANALPRAM HC) cream Apply 1 application topically at bedtime as needed (for skin).     . Psyllium (METAMUCIL PO) 4 tablespoons by mouth daily as needed, for consitpation    . rosuvastatin (CRESTOR) 20 MG tablet Take 1 tablet (20 mg total) by mouth daily. 90 tablet 1  . Ubiquinol 100 MG CAPS Take 100 mg by mouth daily.     No current facility-administered medications on file prior to visit.     Allergies  Allergen Reactions  . Cephalexin Other (See Comments)    REACTION: tachycardia  . Levofloxacin Other (See Comments)    REACTION: tachycardia  . Lipitor [Atorvastatin] Palpitations    Muscle aches on lipitor 80 mg daily  . Penicillins Rash    REACTION: rash    Assessment/Plan:  1. Hyperlipidemia - LDL at goal of < 70 mg/dL and LDL particle number has improved, likely to 10 lb recent weight loss. Will continue rosuvastatin 20mg  daily. Follow-up in 6 months with NMR lipid panel and pharmacy visit per patient request. Also will check Bmet at lab visit - patient requests sugar to be checked.   Noah [name], PharmD PGY1 Resident 08/07/2016 8:47 AM  Patient seen with: Noah [name], PharmD, CPP, [place] [address], [place],  [address] Phone: [phone]; Fax: [phone]

## 2016-08-14 ENCOUNTER — Ambulatory Visit: Payer: Medicare Other | Admitting: Interventional Cardiology

## 2016-08-15 ENCOUNTER — Other Ambulatory Visit: Payer: Medicare Other

## 2016-08-19 NOTE — Progress Notes (Signed)
Cardiology Office Note   Date:  08/20/2016   ID:  Noah [name], DOB [birth year], MRN [id number]  PCP:  Noah Cruel, MD    No chief complaint on file. CAD   Wt Readings from Last 3 Encounters:  08/20/16 184 lb 3.2 oz (83.6 kg)  02/07/16 176 lb (79.8 kg)  09/20/15 177 lb (80.3 kg)       History of Present Illness: Noah [name] is a 66 y.o. male  who has had CAD. He had CABG in 2007.   He has had short runs of AFib in the past. Palpitations had stopped since he stopped fish oil due to prostate cancer concerns. BP has been controlled at home. He has occasional PACs and PVCs.   In January 2017, he had a severe left eye infection.  THis cleared after drops and then oral doxycycline and valacyclovir.  Sx improved.  The appearance of his left eye improved. He had use a cream on the left eye as well.   He had  A  reaction to a shoulder injection in Feb 2017.  No other cardiac issues.  THere was arm numbness, tingling on his head.  He felt he was intolerant to one generic Crestor. Insurance company has denied him brand name Crestor.  Tolerating rosuvastatin made by [name].  In the past, he had arm muscle pains with lipitor, brand name.  He did not tolerate generic atorvastatin since then.    He has had another episode when he turned his head a certain way, and then had some tingling in the top of his head.  He has not passed out.  He has a left rotator cuff that will heal on its own.  He now has right shoulder pain.    He had an ocular migraine- saw a rainbow across his eye transiently.    He continues to exercise regularly. He does 30 minutes on the bike daily while in town.  He does not exercise when he is on vacation.    He has been getting heartburn after eating certain foods.  No problems with exercise.    He had lost weight but apparently gained it back after a recent trip to [place].    He was pleased with his blood work results.  He is heading to [facility] for vacation.      Past Medical History:  Diagnosis Date  . Anxiety   . Arthritis   . Coronary artery disease 07/13/2005   CABG  . DDD (degenerative disc disease)   . DJD (degenerative joint disease)   . Elevated liver enzymes   . Hemangioma of liver   . Hyperlipemia   . PAC (premature atrial contraction)   . Paroxysmal atrial fibrillation (HCC)   . Pneumonia   . PVC (premature ventricular contraction)     Past Surgical History:  Procedure Laterality Date  . CORONARY ARTERY BYPASS GRAFT  07/13/2005  . INGUINAL HERNIA REPAIR Right 1978, 2000   x 2  . KNEE SURGERY Right      Current Outpatient Prescriptions  Medication Sig Dispense Refill  . acetaminophen (TYLENOL) 325 MG tablet Take 650 mg by mouth every 6 (six) hours as needed for pain.    Marland Kitchen aspirin 325 MG tablet Take 325 mg by mouth daily.    . Cholecalciferol (VITAMIN D) 2000 UNITS tablet Take 2,000 Units by mouth daily.    . Flaxseed, Linseed, POWD Take 2 scoop by mouth as directed.    Marland Kitchen  LORazepam (ATIVAN) 0.5 MG tablet Take 1 tablet by mouth at bedtime as needed for sleep. To help with sleep    . metoprolol succinate (TOPROL-XL) 25 MG 24 hr tablet TAKE 1 TABLET TWICE A DAY  WITH OR IMMEDIATELY        FOLLOWING MEALS**[name] MFR** 180 tablet 3  . metoprolol tartrate (LOPRESSOR) 25 MG tablet Take 25 mg by mouth daily as needed.    . Multiple Vitamin (MULTIVITAMIN WITH MINERALS) TABS Take 1 tablet by mouth daily. Centrum silver    . nitroGLYCERIN (NITROSTAT) 0.4 MG SL tablet Place 0.4 mg under the tongue every 5 (five) minutes as needed for chest pain (3 DOSES MAX).    Marland Kitchen pramoxine-hydrocortisone (ANALPRAM HC) cream Apply 1 application topically at bedtime as needed (for skin).     . Psyllium (METAMUCIL PO) 4 tablespoons by mouth daily as needed, for consitpation    . rosuvastatin (CRESTOR) 20 MG tablet Take 1 tablet (20 mg total) by mouth daily. 90 tablet 1  . Ubiquinol 100 MG CAPS Take 100 mg by mouth daily.     No  current facility-administered medications for this visit.     Allergies:   Cephalexin; Levofloxacin; Lipitor [atorvastatin]; and Penicillins    Social History:  The patient  reports that he quit smoking about 42 years ago. His smoking use included Cigarettes. He has never used smokeless tobacco. He reports that he drinks alcohol. He reports that he does not use drugs.   Family History:  The patient's family history includes Atrial fibrillation (age of onset: 32) in his mother; Colon polyps in his mother; Coronary artery disease in his father; Heart attack in his father, maternal grandfather, paternal grandfather, and paternal grandmother; Prostate cancer in his maternal uncle; Seizures in his brother.    ROS:  Please see the history of present illness.   Otherwise, review of systems are positive for heartburn.   All other systems are reviewed and negative.    PHYSICAL EXAM: VS:  BP 120/68   Pulse 75   Ht 5\' 8"  (1.727 m)   Wt 184 lb 3.2 oz (83.6 kg)   SpO2 97%   BMI 28.01 kg/m  , BMI Body mass index is 28.01 kg/m. GEN: Well nourished, well developed, in no acute distress  HEENT: normal  Neck: no JVD, carotid bruits, or masses Cardiac: RRR; no murmurs, rubs, or gallops,no edema  Respiratory:  clear to auscultation bilaterally, normal work of breathing GI: soft, nontender, nondistended, + BS MS: no deformity or atrophy  Skin: warm and dry, no rash Neuro:  Strength and sensation are intact Psych: euthymic mood, full affect   EKG:   The ekg ordered today demonstrates NSR, isolated Q in lead 3 which is new   Recent Labs: 09/20/2015: BUN 14; Creat 1.03; Potassium 5.3; Sodium 140 08/02/2016: ALT 27   Lipid Panel    Component Value Date/Time   CHOL 128 08/02/2016 1016   CHOL 121 02/09/2016 1206   CHOL 124 07/28/2014 0903   TRIG 124 08/02/2016 1016   TRIG 112 07/28/2014 0903   HDL 44 08/02/2016 1016   HDL 46 07/28/2014 0903   CHOLHDL 2.5 02/09/2016 1206   LDLCALC 53  02/09/2016 1206   LDLCALC 56 07/28/2014 0903     Other studies Reviewed: Additional studies/ records that were reviewed today with results demonstrating: LDL 56 in 6/16..   ASSESSMENT AND PLAN:  1. CAD: Negative stress test in 2013.  Given the heartburn, will plan on repeat  cardiolite when he is ready as it has been > 10 years since CABG.  He will likely do this in September.  Sx sound more GI related.  Can take lower aspirin dose for cardiac prophylaxis.  2. Hyperlipidemia: Well controlled.  COntinue current meds. 3. HTN: controlled. Continue current medicines. 4. Heartburn: I suspect this is more GI related.  Improved with PPI.    Current medicines are reviewed at length with the patient today.  The patient concerns regarding his medicines were addressed.  The following changes have been made:  No change  Labs/ tests ordered today include: stress test No orders of the defined types were placed in this encounter.   Recommend 150 minutes/week of aerobic exercise Low fat, low carb, high fiber diet recommended  Disposition:   FU in 6 months.   SignedLarae Grooms, MD  08/20/2016 11:52 AM    [facility] [place], [place], [place]  [address] Phone: [phone]; Fax: [phone]

## 2016-08-20 ENCOUNTER — Encounter: Payer: Self-pay | Admitting: Interventional Cardiology

## 2016-08-20 ENCOUNTER — Ambulatory Visit (INDEPENDENT_AMBULATORY_CARE_PROVIDER_SITE_OTHER): Payer: Medicare Other | Admitting: Interventional Cardiology

## 2016-08-20 VITALS — BP 120/68 | HR 75 | Ht 68.0 in | Wt 184.2 lb

## 2016-08-20 DIAGNOSIS — E782 Mixed hyperlipidemia: Secondary | ICD-10-CM | POA: Diagnosis not present

## 2016-08-20 DIAGNOSIS — I10 Essential (primary) hypertension: Secondary | ICD-10-CM

## 2016-08-20 DIAGNOSIS — I2510 Atherosclerotic heart disease of native coronary artery without angina pectoris: Secondary | ICD-10-CM | POA: Diagnosis not present

## 2016-08-20 DIAGNOSIS — R12 Heartburn: Secondary | ICD-10-CM | POA: Diagnosis not present

## 2016-08-20 NOTE — Patient Instructions (Addendum)
Medication Instructions:  Your physician recommends that you continue on your current medications as directed. Please refer to the Current Medication list given to you today.   Labwork: None ordered  Testing/Procedures: Your physician has requested that you have en exercise stress myoview. For further information please visit [email or web address]. Please follow instruction sheet, as given.   Follow-Up: Your physician wants you to follow-up in: 6 months with Dr. Irish Lack. You will receive a reminder letter in the mail two months in advance. If you don't receive a letter, please call our office to schedule the follow-up appointment.   Any Other Special Instructions Will Be Listed Below (If Applicable).     If you need a refill on your cardiac medications before your next appointment, please call your pharmacy.

## 2016-08-27 DIAGNOSIS — M25511 Pain in right shoulder: Secondary | ICD-10-CM | POA: Diagnosis not present

## 2016-08-27 DIAGNOSIS — M79604 Pain in right leg: Secondary | ICD-10-CM | POA: Diagnosis not present

## 2016-08-27 DIAGNOSIS — M25551 Pain in right hip: Secondary | ICD-10-CM | POA: Diagnosis not present

## 2016-08-27 DIAGNOSIS — M542 Cervicalgia: Secondary | ICD-10-CM | POA: Diagnosis not present

## 2016-09-03 DIAGNOSIS — M25551 Pain in right hip: Secondary | ICD-10-CM | POA: Diagnosis not present

## 2016-09-03 DIAGNOSIS — M25511 Pain in right shoulder: Secondary | ICD-10-CM | POA: Diagnosis not present

## 2016-09-03 DIAGNOSIS — M79604 Pain in right leg: Secondary | ICD-10-CM | POA: Diagnosis not present

## 2016-09-03 DIAGNOSIS — M542 Cervicalgia: Secondary | ICD-10-CM | POA: Diagnosis not present

## 2016-09-12 ENCOUNTER — Other Ambulatory Visit: Payer: Self-pay | Admitting: Family Medicine

## 2016-09-12 DIAGNOSIS — R109 Unspecified abdominal pain: Secondary | ICD-10-CM

## 2016-09-12 DIAGNOSIS — F419 Anxiety disorder, unspecified: Secondary | ICD-10-CM | POA: Diagnosis not present

## 2016-09-13 DIAGNOSIS — M25511 Pain in right shoulder: Secondary | ICD-10-CM | POA: Diagnosis not present

## 2016-09-13 DIAGNOSIS — M25551 Pain in right hip: Secondary | ICD-10-CM | POA: Diagnosis not present

## 2016-09-13 DIAGNOSIS — M79604 Pain in right leg: Secondary | ICD-10-CM | POA: Diagnosis not present

## 2016-09-13 DIAGNOSIS — M542 Cervicalgia: Secondary | ICD-10-CM | POA: Diagnosis not present

## 2016-09-19 ENCOUNTER — Ambulatory Visit
Admission: RE | Admit: 2016-09-19 | Discharge: 2016-09-19 | Disposition: A | Discharge status: Home / Self Care | Payer: Medicare Other | Source: Ambulatory Visit | Attending: Family Medicine | Admitting: Family Medicine

## 2016-09-19 ENCOUNTER — Other Ambulatory Visit: Payer: Medicare Other

## 2016-09-19 DIAGNOSIS — R109 Unspecified abdominal pain: Secondary | ICD-10-CM

## 2016-09-19 DIAGNOSIS — D1809 Hemangioma of other sites: Secondary | ICD-10-CM | POA: Diagnosis not present

## 2016-09-24 DIAGNOSIS — K9289 Other specified diseases of the digestive system: Secondary | ICD-10-CM | POA: Diagnosis not present

## 2016-09-24 DIAGNOSIS — D1803 Hemangioma of intra-abdominal structures: Secondary | ICD-10-CM | POA: Diagnosis not present

## 2016-09-24 DIAGNOSIS — R93429 Abnormal radiologic findings on diagnostic imaging of unspecified kidney: Secondary | ICD-10-CM | POA: Diagnosis not present

## 2016-09-25 ENCOUNTER — Other Ambulatory Visit: Payer: Self-pay | Admitting: Gastroenterology

## 2016-09-25 DIAGNOSIS — K9289 Other specified diseases of the digestive system: Secondary | ICD-10-CM

## 2016-09-28 ENCOUNTER — Encounter: Payer: Self-pay | Admitting: Interventional Cardiology

## 2016-10-08 ENCOUNTER — Ambulatory Visit
Admission: RE | Admit: 2016-10-08 | Discharge: 2016-10-08 | Disposition: A | Discharge status: Home / Self Care | Payer: Medicare Other | Source: Ambulatory Visit | Attending: Gastroenterology | Admitting: Gastroenterology

## 2016-10-08 DIAGNOSIS — N281 Cyst of kidney, acquired: Secondary | ICD-10-CM | POA: Diagnosis not present

## 2016-10-08 DIAGNOSIS — K9289 Other specified diseases of the digestive system: Secondary | ICD-10-CM

## 2016-10-08 MED ORDER — GADOBENATE DIMEGLUMINE 529 MG/ML IV SOLN
17.0000 mL | Freq: Once | INTRAVENOUS | Status: AC | PRN
  Administered 2016-10-08: 17 mL via INTRAVENOUS

## 2016-10-13 ENCOUNTER — Encounter: Payer: Self-pay | Admitting: Interventional Cardiology

## 2016-10-18 ENCOUNTER — Telehealth (HOSPITAL_COMMUNITY): Payer: Self-pay | Admitting: *Deleted

## 2016-10-18 NOTE — Telephone Encounter (Signed)
Patient given detailed instructions per Myocardial Perfusion Study Information Sheet for the test on  10/23/16. Patient notified to arrive 15 minutes early and that it is imperative to arrive on time for appointment to keep from having the test rescheduled.  If you need to cancel or reschedule your appointment, please call the office within 24 hours of your appointment. . Patient verbalized understanding. Kirstie Peri

## 2016-10-23 ENCOUNTER — Ambulatory Visit (HOSPITAL_COMMUNITY): Payer: Medicare Other | Attending: Cardiology

## 2016-10-23 DIAGNOSIS — I2510 Atherosclerotic heart disease of native coronary artery without angina pectoris: Secondary | ICD-10-CM | POA: Diagnosis not present

## 2016-10-23 DIAGNOSIS — R9439 Abnormal result of other cardiovascular function study: Secondary | ICD-10-CM | POA: Insufficient documentation

## 2016-10-23 LAB — MYOCARDIAL PERFUSION IMAGING
CHL CUP NUCLEAR SRS: 0
CHL CUP NUCLEAR SSS: 0
CSEPEDS: 0 s
CSEPEW: 10.1 METS
CSEPPHR: 148 {beats}/min
Exercise duration (min): 8 min
LV dias vol: 91 mL (ref 62–150)
LV sys vol: 30 mL
MPHR: 153 {beats}/min
NUC STRESS TID: 0.8
Percent HR: 96 %
RATE: 0.27
Rest HR: 83 {beats}/min
SDS: 0

## 2016-10-23 MED ORDER — TECHNETIUM TC 99M TETROFOSMIN IV KIT
32.4000 | PACK | Freq: Once | INTRAVENOUS | Status: AC | PRN
  Administered 2016-10-23: 32.4 via INTRAVENOUS
  Filled 2016-10-23: qty 33

## 2016-10-23 MED ORDER — TECHNETIUM TC 99M TETROFOSMIN IV KIT
10.8000 | PACK | Freq: Once | INTRAVENOUS | Status: AC | PRN
  Administered 2016-10-23: 10.8 via INTRAVENOUS
  Filled 2016-10-23: qty 11

## 2016-10-29 ENCOUNTER — Other Ambulatory Visit: Payer: Self-pay

## 2016-10-29 ENCOUNTER — Encounter: Payer: Self-pay | Admitting: Interventional Cardiology

## 2016-10-29 ENCOUNTER — Other Ambulatory Visit: Payer: Self-pay | Admitting: Interventional Cardiology

## 2016-10-29 DIAGNOSIS — R079 Chest pain, unspecified: Secondary | ICD-10-CM

## 2016-10-29 MED ORDER — NITROGLYCERIN 0.4 MG SL SUBL: 0.4000 mg | SUBLINGUAL_TABLET | SUBLINGUAL | 3 refills | Status: DC | PRN

## 2016-10-31 NOTE — Telephone Encounter (Signed)
Medication Detail    Disp Refills Start End   nitroGLYCERIN (NITROSTAT) 0.4 MG SL tablet 25 tablet 3 10/29/2016    Sig - Route: Place 1 tablet (0.4 mg total) under the tongue every 5 (five) minutes as needed for chest pain (3 DOSES MAX). - Sublingual   Sent to pharmacy as: nitroGLYCERIN (NITROSTAT) 0.4 MG SL tablet   E-Prescribing Status: Receipt confirmed by pharmacy (10/29/2016 11:39 AM EDT)   Pharmacy   [id number] - [place], [place] - [address] [place] [address]

## 2016-11-05 DIAGNOSIS — Z23 Encounter for immunization: Secondary | ICD-10-CM | POA: Diagnosis not present

## 2016-11-07 ENCOUNTER — Encounter: Payer: Self-pay | Admitting: Interventional Cardiology

## 2016-11-09 ENCOUNTER — Encounter: Payer: Self-pay | Admitting: Interventional Cardiology

## 2016-11-23 ENCOUNTER — Other Ambulatory Visit: Payer: Self-pay | Admitting: Interventional Cardiology

## 2016-11-29 ENCOUNTER — Encounter: Payer: Self-pay | Admitting: Interventional Cardiology

## 2016-11-29 DIAGNOSIS — I700 Atherosclerosis of aorta: Secondary | ICD-10-CM | POA: Diagnosis not present

## 2016-11-29 DIAGNOSIS — Z79899 Other long term (current) drug therapy: Secondary | ICD-10-CM | POA: Diagnosis not present

## 2016-11-29 DIAGNOSIS — Z0000 Encounter for general adult medical examination without abnormal findings: Secondary | ICD-10-CM | POA: Diagnosis not present

## 2016-11-29 DIAGNOSIS — E782 Mixed hyperlipidemia: Secondary | ICD-10-CM | POA: Diagnosis not present

## 2016-11-29 DIAGNOSIS — F419 Anxiety disorder, unspecified: Secondary | ICD-10-CM | POA: Diagnosis not present

## 2016-11-29 DIAGNOSIS — Z125 Encounter for screening for malignant neoplasm of prostate: Secondary | ICD-10-CM | POA: Diagnosis not present

## 2016-11-29 DIAGNOSIS — R1013 Epigastric pain: Secondary | ICD-10-CM | POA: Diagnosis not present

## 2016-11-29 DIAGNOSIS — R002 Palpitations: Secondary | ICD-10-CM | POA: Diagnosis not present

## 2016-12-09 ENCOUNTER — Encounter: Payer: Self-pay | Admitting: Interventional Cardiology

## 2017-01-01 DIAGNOSIS — J301 Allergic rhinitis due to pollen: Secondary | ICD-10-CM | POA: Diagnosis not present

## 2017-01-01 DIAGNOSIS — R05 Cough: Secondary | ICD-10-CM | POA: Diagnosis not present

## 2017-01-01 DIAGNOSIS — J3089 Other allergic rhinitis: Secondary | ICD-10-CM | POA: Diagnosis not present

## 2017-01-01 DIAGNOSIS — K219 Gastro-esophageal reflux disease without esophagitis: Secondary | ICD-10-CM | POA: Diagnosis not present

## 2017-01-15 DIAGNOSIS — L820 Inflamed seborrheic keratosis: Secondary | ICD-10-CM | POA: Diagnosis not present

## 2017-01-15 DIAGNOSIS — L821 Other seborrheic keratosis: Secondary | ICD-10-CM | POA: Diagnosis not present

## 2017-01-15 DIAGNOSIS — D1801 Hemangioma of skin and subcutaneous tissue: Secondary | ICD-10-CM | POA: Diagnosis not present

## 2017-01-15 DIAGNOSIS — L570 Actinic keratosis: Secondary | ICD-10-CM | POA: Diagnosis not present

## 2017-01-23 ENCOUNTER — Other Ambulatory Visit: Payer: Medicare Other | Admitting: *Deleted

## 2017-01-23 DIAGNOSIS — E782 Mixed hyperlipidemia: Secondary | ICD-10-CM | POA: Diagnosis not present

## 2017-01-24 LAB — BASIC METABOLIC PANEL
BUN/Creatinine Ratio: 11 (ref 10–24)
BUN: 13 mg/dL (ref 8–27)
CALCIUM: 9.8 mg/dL (ref 8.6–10.2)
CHLORIDE: 102 mmol/L (ref 96–106)
CO2: 24 mmol/L (ref 20–29)
Creatinine, Ser: 1.16 mg/dL (ref 0.76–1.27)
GFR calc Af Amer: 75 mL/min/{1.73_m2} (ref >59)
GFR calc non Af Amer: 65 mL/min/{1.73_m2} (ref >59)
Glucose: 91 mg/dL (ref 65–99)
POTASSIUM: 5.2 mmol/L (ref 3.5–5.2)
Sodium: 139 mmol/L (ref 134–144)

## 2017-01-24 LAB — NMR, LIPOPROFILE
CHOLESTEROL: 116 mg/dL (ref 100–199)
HDL CHOLESTEROL BY NMR: 46 mg/dL (ref >39)
HDL PARTICLE NUMBER: 36.5 umol/L (ref >=30.5)
LDL Particle Number: 646 nmol/L (ref <1000)
LDL Size: 19.7 nm — ABNORMAL LOW (ref >20.5)
LDL-C: 48 mg/dL (ref 0–99)
LP-IR Score: 51 — ABNORMAL HIGH (ref <=45)
SMALL LDL PARTICLE NUMBER: 517 nmol/L (ref <=527)
Triglycerides by NMR: 108 mg/dL (ref 0–149)

## 2017-01-24 LAB — APOLIPOPROTEIN B: APOLIPOPROTEIN B: 73 mg/dL (ref 52–135)

## 2017-01-29 ENCOUNTER — Other Ambulatory Visit: Payer: Medicare Other

## 2017-02-05 ENCOUNTER — Ambulatory Visit: Payer: Medicare Other

## 2017-02-06 ENCOUNTER — Encounter: Payer: Self-pay | Admitting: Interventional Cardiology

## 2017-02-06 ENCOUNTER — Ambulatory Visit (INDEPENDENT_AMBULATORY_CARE_PROVIDER_SITE_OTHER): Payer: Medicare Other | Admitting: Interventional Cardiology

## 2017-02-06 VITALS — BP 127/80 | HR 72 | Ht 68.0 in | Wt 182.0 lb

## 2017-02-06 DIAGNOSIS — I2510 Atherosclerotic heart disease of native coronary artery without angina pectoris: Secondary | ICD-10-CM

## 2017-02-06 DIAGNOSIS — I480 Paroxysmal atrial fibrillation: Secondary | ICD-10-CM

## 2017-02-06 DIAGNOSIS — I10 Essential (primary) hypertension: Secondary | ICD-10-CM

## 2017-02-06 DIAGNOSIS — E782 Mixed hyperlipidemia: Secondary | ICD-10-CM | POA: Diagnosis not present

## 2017-02-06 DIAGNOSIS — I700 Atherosclerosis of aorta: Secondary | ICD-10-CM

## 2017-02-06 NOTE — Patient Instructions (Signed)

## 2017-02-06 NOTE — Progress Notes (Signed)
Cardiology Office Note   Date:  02/06/2017   ID:  [name], DOB [birth year], MRN [id number]  PCP:  Lawerance Cruel, MD    No chief complaint on file.  CAD  Wt Readings from Last 3 Encounters:  02/06/17 182 lb (82.6 kg)  10/23/16 184 lb (83.5 kg)  08/20/16 184 lb 3.2 oz (83.6 kg)       History of Present Illness: Noah [name] is a 66 y.o. male  who has had CAD. He had CABG in 2007.   He has had short runs of AFib in the past. Palpitations had stopped since he stopped fish oil due to prostate cancer concerns. BP has been controlled at home. He has occasional PACs and PVCs.   In January 2017, he had a severe left eye infection. THis cleared after drops and then oral doxycycline and valacyclovir. Sx improved. The appearance of his left eye improved. He had use a cream on the left eye as well.   He had areaction to a shoulder injection in Feb 2017. No other cardiac issues. THere was arm numbness, tingling on his head.  He felt he was intolerant to one generic Crestor. Insurance company has denied him brand name Crestor. Tolerating rosuvastatin made by [name].  In the past, he had arm muscle pains with lipitor, brand name. He did not tolerate generic atorvastatin since then.   He had an ocular migraine- saw a rainbow across his eye transiently. We gave a referral to neuro but he has not gone as of yet.  Sx have been stable.  His brother had a seizure disorder.  He is pleased with his lipids.    Denies : Chest pain. Dizziness. Leg edema. Nitroglycerin use. Orthopnea. Palpitations. Paroxysmal nocturnal dyspnea. Shortness of breath. Syncope.   Still doing the bike regularly.  30 minutes a day.  No sx with this activity.  He wants to go over his aorta and stress test findings.     Past Medical History:  Diagnosis Date  . Anxiety   . Arthritis   . Coronary artery disease 07/13/2005   CABG  . DDD (degenerative disc disease)   . DJD  (degenerative joint disease)   . Elevated liver enzymes   . Hemangioma of liver   . Hyperlipemia   . PAC (premature atrial contraction)   . Paroxysmal atrial fibrillation (HCC)   . Pneumonia   . PVC (premature ventricular contraction)     Past Surgical History:  Procedure Laterality Date  . CORONARY ARTERY BYPASS GRAFT  07/13/2005  . INGUINAL HERNIA REPAIR Right 1978, 2000   x 2  . KNEE SURGERY Right      Current Outpatient Medications  Medication Sig Dispense Refill  . acetaminophen (TYLENOL) 325 MG tablet Take 650 mg by mouth every 6 (six) hours as needed for pain.    Marland Kitchen aspirin 325 MG tablet Take 325 mg by mouth daily.    . Cholecalciferol (VITAMIN D) 2000 UNITS tablet Take 2,000 Units by mouth daily.    . Flaxseed, Linseed, POWD Take 2 scoop by mouth as directed.    Marland Kitchen LORazepam (ATIVAN) 0.5 MG tablet Take 1 tablet by mouth at bedtime as needed for sleep. To help with sleep    . metoprolol succinate (TOPROL-XL) 25 MG 24 hr tablet TAKE 1 TABLET TWICE A DAY  WITH OR IMMEDIATELY        FOLLOWING MEALS**[name] MFR** 180 tablet 3  . metoprolol tartrate (LOPRESSOR) 25 MG tablet  Take 25 mg by mouth daily as needed.    . Multiple Vitamin (MULTIVITAMIN WITH MINERALS) TABS Take 1 tablet by mouth daily. Centrum silver    . nitroGLYCERIN (NITROSTAT) 0.4 MG SL tablet Place 1 tablet (0.4 mg total) under the tongue every 5 (five) minutes as needed for chest pain (3 DOSES MAX). 25 tablet 3  . pramoxine-hydrocortisone (ANALPRAM HC) cream Apply 1 application topically at bedtime as needed (for skin).     . Psyllium (METAMUCIL PO) 4 tablespoons by mouth daily as needed, for consitpation    . rosuvastatin (CRESTOR) 20 MG tablet TAKE 1 TABLET DAILY **     APOTEX MFR** 90 tablet 2  . Ubiquinol 100 MG CAPS Take 100 mg by mouth daily.     No current facility-administered medications for this visit.     Allergies:   Cephalexin; Levofloxacin; Ciprofloxacin; Doxycycline; Lipitor [atorvastatin]; and  Penicillins    Social History:  The patient  reports that he quit smoking about 42 years ago. His smoking use included cigarettes. he has never used smokeless tobacco. He reports that he drinks alcohol. He reports that he does not use drugs.   Family History:  The patient's family history includes Atrial fibrillation (age of onset: 56) in his mother; Colon polyps in his mother; Coronary artery disease in his father; Heart attack in his father, maternal grandfather, paternal grandfather, and paternal grandmother; Prostate cancer in his maternal uncle; Seizures in his brother.    ROS:  Please see the history of present illness.   Otherwise, review of systems are positive for rare lightheadedness.   All other systems are reviewed and negative.    PHYSICAL EXAM: VS:  BP 127/80   Pulse 72   Ht 5\' 8"  (1.727 m)   Wt 182 lb (82.6 kg)   SpO2 96%   BMI 27.67 kg/m  , BMI Body mass index is 27.67 kg/m. GEN: Well nourished, well developed, in no acute distress  HEENT: normal  Neck: no JVD, carotid bruits, or masses Cardiac: RRR; no murmurs, rubs, or gallops,no edema  Respiratory:  clear to auscultation bilaterally, normal work of breathing GI: soft, nontender, nondistended, + BS MS: no deformity or atrophy  Skin: warm and dry, no rash Neuro:  Strength and sensation are intact Psych: euthymic mood, full affect    Recent Labs: 08/02/2016: ALT 27 01/23/2017: BUN 13; Creatinine, Ser 1.16; Potassium 5.2; Sodium 139   Lipid Panel    Component Value Date/Time   CHOL 116 01/23/2017 1030   CHOL 121 02/09/2016 1206   CHOL 124 07/28/2014 0903   TRIG 108 01/23/2017 1030   TRIG 112 07/28/2014 0903   HDL 46 01/23/2017 1030   HDL 46 07/28/2014 0903   CHOLHDL 2.5 02/09/2016 1206   LDLCALC 53 02/09/2016 1206   LDLCALC 56 07/28/2014 0903     Other studies Reviewed: Additional studies/ records that were reviewed today with results demonstrating: Stress test findings noted below.  Minimal carotid  plaque noted in 2017.   ASSESSMENT AND PLAN:  1. CAD : No angina.  Continue aggressive medical therapy.  We again discussed in detail his stress test result.  He had a fixed inferior wall defect.  There is no reversible ischemia.  It was a low risk study.  Ejection fraction unchanged from study in 2013.  He continues to ride his bike regularly without any anginal symptoms. 2. Hyperlipidemia: We also went over his lipo results which showed very well controlled small particle numbers.  Continue  current lipid-lowering therapy. 3. HTN: Well-controlled.  Continue current medications. 4. Aortic atherosclerosis: I explained to him that this is an expected finding given his coronary artery disease.  There is no evidence or physical signs that the aortic atherosclerosis is obstructive.  He has no claudication. 5. PAF: noted in the past.  No recent symptoms.   Current medicines are reviewed at length with the patient today.  The patient concerns regarding his medicines were addressed.  The following changes have been made:  No change   Labs/ tests ordered today include:  No orders of the defined types were placed in this encounter.   Recommend 150 minutes/week of aerobic exercise Low fat, low carb, high fiber diet recommended  Disposition:   FU in 1 year   Signed, Larae Grooms, MD  02/06/2017 4:00 PM    [facility] [place], [place],   [address] Phone: [phone]; Fax: [phone]

## 2017-02-07 DIAGNOSIS — I700 Atherosclerosis of aorta: Secondary | ICD-10-CM | POA: Insufficient documentation

## 2017-03-14 DIAGNOSIS — M549 Dorsalgia, unspecified: Secondary | ICD-10-CM | POA: Diagnosis not present

## 2017-03-14 DIAGNOSIS — R002 Palpitations: Secondary | ICD-10-CM | POA: Diagnosis not present

## 2017-03-14 DIAGNOSIS — F411 Generalized anxiety disorder: Secondary | ICD-10-CM | POA: Diagnosis not present

## 2017-03-14 DIAGNOSIS — R350 Frequency of micturition: Secondary | ICD-10-CM | POA: Diagnosis not present

## 2017-03-14 DIAGNOSIS — Z7189 Other specified counseling: Secondary | ICD-10-CM | POA: Diagnosis not present

## 2017-03-28 ENCOUNTER — Encounter: Payer: Self-pay | Admitting: Interventional Cardiology

## 2017-03-29 NOTE — Telephone Encounter (Signed)
Left message for patient to call back  

## 2017-04-01 ENCOUNTER — Ambulatory Visit (INDEPENDENT_AMBULATORY_CARE_PROVIDER_SITE_OTHER): Payer: Medicare Other

## 2017-04-01 ENCOUNTER — Other Ambulatory Visit: Payer: Self-pay

## 2017-04-01 DIAGNOSIS — I480 Paroxysmal atrial fibrillation: Secondary | ICD-10-CM

## 2017-04-01 DIAGNOSIS — I493 Ventricular premature depolarization: Secondary | ICD-10-CM | POA: Diagnosis not present

## 2017-04-01 NOTE — Telephone Encounter (Signed)
Called patient regarding MyChart Messages. Patient is now saying that it may not happen on a daily basis and wants to wear the monitor for longer than just 24 hours. Offered for patient to wear the 30-day event monitor. Patient states that he is leaving for [place] on Saturday and does not want to wear the monitor while he is gone, but does want to wear one before he goes. Arranged for the patient to come in for 48-hour monitor placement this afternoon at 3:00 PM. Made patient aware that we may not have results to the monitor before he leaves. Patient verbalized understanding. Patient also requesting an appointment with Dr. Irish Lack this week to discuss everything. Appointment made for the patient to come in tomorrow at 11:40 AM.

## 2017-04-01 NOTE — Progress Notes (Signed)
Cardiology Office Note   Date:  04/02/2017   ID:  Noah [name], DOB [birth year], MRN [id number]  PCP:  Lawerance Cruel, MD    No chief complaint on file.  CAD  Wt Readings from Last 3 Encounters:  04/02/17 178 lb 1.9 oz (80.8 kg)  02/06/17 182 lb (82.6 kg)  10/23/16 184 lb (83.5 kg)       History of Present Illness: Noah [name] is a 67 y.o. male  who has had CAD. He had CABG in 2007.   He has had short runs of AFib in the past. Palpitations had stopped for a while after he stopped fish oil, due to prostate cancer concerns. BP has been controlled at home. He has occasional PACs and PVCs.   In January 2017, he had a severe left eye infection. THis cleared after drops and then oral doxycycline and valacyclovir. Sx improved. The appearance of his left eye improved. He had use a cream on the left eye as well.   He had areaction to a shoulder injection in Feb 2017. No other cardiac issues. THere was arm numbness, tingling on his head.  He felt he was intolerant to one generic Crestor. Insurance company has denied him brand name Crestor. Tolerating rosuvastatin made by [name].  In the past, he had arm muscle pains with lipitor, brand name. He did not tolerate generic atorvastatin since then.   He had an ocular migraine- saw a rainbow across his eye transiently. We gave a referral to neuro but he has not gone as of yet.  Sx have been stable.  His brother had a seizure disorder.  He will be heading to [place] in the near future.  A friend of his died recently.  He has noticed some palpitations that did not resolve with extra metoprolol and Ativan.  THis was concerning to him.  He felt something on the bike.  THis has made him anxious.  He has been trying to eat less and lose weight.  He has decreased alcohol as well.  Alcohol has triggered premature beats in the past. He continues to ride the bike.  No chest pain.    He had been taking more Advil  for shoulder pain.  He also was using a new bottle of metoprolol succinate.    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  He does report a lot of gas pushing up and putting pressure on his heart.    Past Medical History:  Diagnosis Date  . Anxiety   . Arthritis   . Coronary artery disease 07/13/2005   CABG  . DDD (degenerative disc disease)   . DJD (degenerative joint disease)   . Elevated liver enzymes   . Hemangioma of liver   . Hyperlipemia   . PAC (premature atrial contraction)   . Paroxysmal atrial fibrillation (HCC)   . Pneumonia   . PVC (premature ventricular contraction)     Past Surgical History:  Procedure Laterality Date  . CORONARY ARTERY BYPASS GRAFT  07/13/2005  . INGUINAL HERNIA REPAIR Right 1978, 2000   x 2  . KNEE SURGERY Right      Current Outpatient Medications  Medication Sig Dispense Refill  . acetaminophen (TYLENOL) 325 MG tablet Take 650 mg by mouth every 6 (six) hours as needed for pain.    Marland Kitchen aspirin 325 MG tablet Take 325 mg by mouth daily.    . Cholecalciferol (VITAMIN D) 2000 UNITS  tablet Take 2,000 Units by mouth daily.    . Flaxseed, Linseed, POWD Take 2 scoop by mouth as directed.    Marland Kitchen LORazepam (ATIVAN) 0.5 MG tablet Take 1 tablet by mouth at bedtime as needed for sleep. To help with sleep    . metoprolol succinate (TOPROL-XL) 25 MG 24 hr tablet TAKE 1 TABLET TWICE A DAY  WITH OR IMMEDIATELY        FOLLOWING MEALS**[name] MFR** 180 tablet 3  . metoprolol tartrate (LOPRESSOR) 25 MG tablet Take 25 mg by mouth daily as needed.    . Multiple Vitamin (MULTIVITAMIN WITH MINERALS) TABS Take 1 tablet by mouth daily. Centrum silver    . nitroGLYCERIN (NITROSTAT) 0.4 MG SL tablet Place 1 tablet (0.4 mg total) under the tongue every 5 (five) minutes as needed for chest pain (3 DOSES MAX). 25 tablet 3  . pramoxine-hydrocortisone (ANALPRAM HC) cream Apply 1 application topically at bedtime as needed (for skin).     .  Psyllium (METAMUCIL PO) 4 tablespoons by mouth daily as needed, for consitpation    . rosuvastatin (CRESTOR) 20 MG tablet TAKE 1 TABLET DAILY **     APOTEX MFR** 90 tablet 2  . Ubiquinol 100 MG CAPS Take 100 mg by mouth daily.     No current facility-administered medications for this visit.     Allergies:   Cephalexin; Levofloxacin; Ciprofloxacin; Doxycycline; Lipitor [atorvastatin]; and Penicillins    Social History:  The patient  reports that he quit smoking about 43 years ago. His smoking use included cigarettes. he has never used smokeless tobacco. He reports that he drinks alcohol. He reports that he does not use drugs.   Family History:  The patient's family history includes Atrial fibrillation (age of onset: 52) in his mother; Colon polyps in his mother; Coronary artery disease in his father; Heart attack in his father, maternal grandfather, paternal grandfather, and paternal grandmother; Prostate cancer in his maternal uncle; Seizures in his brother.    ROS:  Please see the history of present illness.   Otherwise, review of systems are positive for palpitaitons.   All other systems are reviewed and negative.    PHYSICAL EXAM: VS:  BP 102/60   Pulse 74   Ht 5\' 8"  (1.727 m)   Wt 178 lb 1.9 oz (80.8 kg)   SpO2 98%   BMI 27.08 kg/m  , BMI Body mass index is 27.08 kg/m. GEN: Well nourished, well developed, in no acute distress  HEENT: normal  Neck: no JVD, carotid bruits, or masses Cardiac: RRR; no murmurs, rubs, or gallops,no edema  Respiratory:  clear to auscultation bilaterally, normal work of breathing GI: soft, nontender, nondistended, + BS MS: no deformity or atrophy  Skin: warm and dry, no rash Neuro:  Strength and sensation are intact Psych: euthymic mood, full affect   EKG:   The ekg ordered today demonstrates NSR, no ST changes   Recent Labs: 08/02/2016: ALT 27 01/23/2017: BUN 13; Creatinine, Ser 1.16; Potassium 5.2; Sodium 139   Lipid Panel    Component  Value Date/Time   CHOL 116 01/23/2017 1030   CHOL 121 02/09/2016 1206   CHOL 124 07/28/2014 0903   TRIG 108 01/23/2017 1030   TRIG 112 07/28/2014 0903   HDL 46 01/23/2017 1030   HDL 46 07/28/2014 0903   CHOLHDL 2.5 02/09/2016 1206   LDLCALC 53 02/09/2016 1206   LDLCALC 56 07/28/2014 0903     Other studies Reviewed: Additional studies/ records that were reviewed  today with results demonstrating: Holter in progress.   ASSESSMENT AND PLAN:  1. CAD: No angina on aggressive secondary prevention.   2. Palpitations: Wearing a Holter monitor.  He has had sx while wearing the monitor and has kept a diary.  We should have some results in a few days. Minimize NSAID use. 3. Hyperlipidemia: Lipids well controlled.  4. HTN: COntrolled. COntinue current meds.  5. Aortic atherosclerosis: No sx of claudication.  WOuld continue secondary prevention.  6. PAF: Noted many years ago.  Will see what current monitor shows.   Current medicines are reviewed at length with the patient today.  The patient concerns regarding his medicines were addressed.  The following changes have been made:  No change  Labs/ tests ordered today include:  No orders of the defined types were placed in this encounter.   Recommend 150 minutes/week of aerobic exercise Low fat, low carb, high fiber diet recommended  Disposition:   FU based on monitor   Signed, Larae Grooms, MD  04/02/2017 12:18 PM    [facility] [place], [place], [place]  [address] Phone: [phone]; Fax: [phone]

## 2017-04-01 NOTE — Telephone Encounter (Signed)
Called and spoke to patient and made him aware that the slot that we added him in for today is only allotted for a holter. Made him aware that if he would like to wear the 30-day event monitor that the first available appointment is next week. Patient states that he wants to keep appointment for 48-hour holter this afternoon at 3:00 PM.

## 2017-04-02 ENCOUNTER — Encounter: Payer: Self-pay | Admitting: Interventional Cardiology

## 2017-04-02 ENCOUNTER — Ambulatory Visit (INDEPENDENT_AMBULATORY_CARE_PROVIDER_SITE_OTHER): Payer: Medicare Other | Admitting: Interventional Cardiology

## 2017-04-02 VITALS — BP 102/60 | HR 74 | Ht 68.0 in | Wt 178.1 lb

## 2017-04-02 DIAGNOSIS — I2510 Atherosclerotic heart disease of native coronary artery without angina pectoris: Secondary | ICD-10-CM | POA: Diagnosis not present

## 2017-04-02 DIAGNOSIS — I10 Essential (primary) hypertension: Secondary | ICD-10-CM

## 2017-04-02 DIAGNOSIS — I700 Atherosclerosis of aorta: Secondary | ICD-10-CM

## 2017-04-02 DIAGNOSIS — R002 Palpitations: Secondary | ICD-10-CM

## 2017-04-02 DIAGNOSIS — I480 Paroxysmal atrial fibrillation: Secondary | ICD-10-CM

## 2017-04-02 DIAGNOSIS — E782 Mixed hyperlipidemia: Secondary | ICD-10-CM | POA: Diagnosis not present

## 2017-04-02 NOTE — Patient Instructions (Signed)
Medication Instructions:  Your physician recommends that you continue on your current medications as directed. Please refer to the Current Medication list given to you today.   Labwork: None ordered  Testing/Procedures: None ordered  Follow-Up: Will wait on monitor results   Any Other Special Instructions Will Be Listed Below (If Applicable).     If you need a refill on your cardiac medications before your next appointment, please call your pharmacy.

## 2017-04-03 DIAGNOSIS — L821 Other seborrheic keratosis: Secondary | ICD-10-CM | POA: Diagnosis not present

## 2017-04-03 DIAGNOSIS — R002 Palpitations: Secondary | ICD-10-CM | POA: Diagnosis not present

## 2017-04-04 DIAGNOSIS — M542 Cervicalgia: Secondary | ICD-10-CM | POA: Diagnosis not present

## 2017-04-04 DIAGNOSIS — M25511 Pain in right shoulder: Secondary | ICD-10-CM | POA: Diagnosis not present

## 2017-04-04 DIAGNOSIS — M25551 Pain in right hip: Secondary | ICD-10-CM | POA: Diagnosis not present

## 2017-04-04 DIAGNOSIS — M79604 Pain in right leg: Secondary | ICD-10-CM | POA: Diagnosis not present

## 2017-04-16 DIAGNOSIS — H35341 Macular cyst, hole, or pseudohole, right eye: Secondary | ICD-10-CM | POA: Diagnosis not present

## 2017-04-16 DIAGNOSIS — H43391 Other vitreous opacities, right eye: Secondary | ICD-10-CM | POA: Diagnosis not present

## 2017-04-16 DIAGNOSIS — H35371 Puckering of macula, right eye: Secondary | ICD-10-CM | POA: Diagnosis not present

## 2017-04-16 DIAGNOSIS — H43813 Vitreous degeneration, bilateral: Secondary | ICD-10-CM | POA: Diagnosis not present

## 2017-04-18 DIAGNOSIS — L821 Other seborrheic keratosis: Secondary | ICD-10-CM | POA: Diagnosis not present

## 2017-04-18 DIAGNOSIS — D485 Neoplasm of uncertain behavior of skin: Secondary | ICD-10-CM | POA: Diagnosis not present

## 2017-04-18 DIAGNOSIS — D1801 Hemangioma of skin and subcutaneous tissue: Secondary | ICD-10-CM | POA: Diagnosis not present

## 2017-04-18 DIAGNOSIS — L249 Irritant contact dermatitis, unspecified cause: Secondary | ICD-10-CM | POA: Diagnosis not present

## 2017-04-18 DIAGNOSIS — L820 Inflamed seborrheic keratosis: Secondary | ICD-10-CM | POA: Diagnosis not present

## 2017-04-22 ENCOUNTER — Ambulatory Visit: Payer: Medicare Other | Admitting: Interventional Cardiology

## 2017-05-09 ENCOUNTER — Telehealth: Payer: Self-pay | Admitting: Interventional Cardiology

## 2017-05-09 NOTE — Telephone Encounter (Signed)
Pt's pharmacy is requesting a refill on metoprolol succinate. Pt has two metoprolol medications on the list. Please clarify which medication pt is taking, metoprolol succinate or metoprolol tartrate. Please address

## 2017-05-14 ENCOUNTER — Encounter: Payer: Self-pay | Admitting: Interventional Cardiology

## 2017-05-14 MED ORDER — METOPROLOL SUCCINATE ER 25 MG PO TB24: 25.0000 mg | ORAL_TABLET | Freq: Two times a day (BID) | ORAL | 3 refills | Status: DC

## 2017-05-14 MED ORDER — METOPROLOL SUCCINATE ER 25 MG PO TB24: 25.0000 mg | ORAL_TABLET | Freq: Every day | ORAL | 3 refills | Status: DC

## 2017-05-14 NOTE — Addendum Note (Signed)
Addended by: Drue Novel I on: 05/14/2017 01:45 PM   Modules accepted: Orders

## 2017-05-14 NOTE — Telephone Encounter (Signed)
Patient takes BOTH:  - metoprolol succinate (Toprol-XL) 25 mg TWICE A DAY- generic- **LANNETT MFR** (filled through mail order service)  - metoprolol tartrate (Lopressor) 25 mg DAILY PRN for palpitations/irregular heartbeats (this prescription is usually filled locally)  Called [facility] mail [facility] pharmacy to clarify patient's prescription for Metoprolol Succinate. Spoke with Geni Bers and made her aware that the patient should be taking "metoprolol succinate (Toprol-XL) 25 mg TWICE A DAY- generic- **LANNETT MFR**". She verbalized understanding and states that the order will be processed in 24-48 hours.

## 2017-05-14 NOTE — Telephone Encounter (Signed)
Pt's pharmacy calling requesting authorization to refill medication as generic. Pharmacy stated that Rx was sent in as brand only, I do not see this and also requesting that it also states Lanett MFR on it as well. Could you please D/C the other Metoprolol on pt's med list so it will not be anymore confusion as which metoprolol pt is taking. Please address

## 2017-05-29 DIAGNOSIS — K649 Unspecified hemorrhoids: Secondary | ICD-10-CM | POA: Diagnosis not present

## 2017-05-29 DIAGNOSIS — F419 Anxiety disorder, unspecified: Secondary | ICD-10-CM | POA: Diagnosis not present

## 2017-05-29 DIAGNOSIS — R002 Palpitations: Secondary | ICD-10-CM | POA: Diagnosis not present

## 2017-05-29 DIAGNOSIS — R3911 Hesitancy of micturition: Secondary | ICD-10-CM | POA: Diagnosis not present

## 2017-05-30 DIAGNOSIS — R002 Palpitations: Secondary | ICD-10-CM | POA: Diagnosis not present

## 2017-06-06 ENCOUNTER — Encounter: Payer: Self-pay | Admitting: Interventional Cardiology

## 2017-06-07 ENCOUNTER — Other Ambulatory Visit: Payer: Self-pay

## 2017-06-07 DIAGNOSIS — I2510 Atherosclerotic heart disease of native coronary artery without angina pectoris: Secondary | ICD-10-CM

## 2017-06-10 ENCOUNTER — Ambulatory Visit (HOSPITAL_COMMUNITY): Payer: Medicare Other | Attending: Interventional Cardiology

## 2017-06-10 ENCOUNTER — Other Ambulatory Visit: Payer: Self-pay

## 2017-06-10 DIAGNOSIS — E785 Hyperlipidemia, unspecified: Secondary | ICD-10-CM | POA: Diagnosis not present

## 2017-06-10 DIAGNOSIS — I4891 Unspecified atrial fibrillation: Secondary | ICD-10-CM | POA: Insufficient documentation

## 2017-06-10 DIAGNOSIS — M79604 Pain in right leg: Secondary | ICD-10-CM | POA: Diagnosis not present

## 2017-06-10 DIAGNOSIS — M542 Cervicalgia: Secondary | ICD-10-CM | POA: Diagnosis not present

## 2017-06-10 DIAGNOSIS — M25511 Pain in right shoulder: Secondary | ICD-10-CM | POA: Diagnosis not present

## 2017-06-10 DIAGNOSIS — I2510 Atherosclerotic heart disease of native coronary artery without angina pectoris: Secondary | ICD-10-CM

## 2017-06-10 DIAGNOSIS — M25551 Pain in right hip: Secondary | ICD-10-CM | POA: Diagnosis not present

## 2017-06-19 ENCOUNTER — Encounter: Payer: Self-pay | Admitting: Interventional Cardiology

## 2017-06-19 DIAGNOSIS — M79604 Pain in right leg: Secondary | ICD-10-CM | POA: Diagnosis not present

## 2017-06-19 DIAGNOSIS — M25511 Pain in right shoulder: Secondary | ICD-10-CM | POA: Diagnosis not present

## 2017-06-19 DIAGNOSIS — M542 Cervicalgia: Secondary | ICD-10-CM | POA: Diagnosis not present

## 2017-06-19 DIAGNOSIS — M7989 Other specified soft tissue disorders: Secondary | ICD-10-CM

## 2017-06-19 DIAGNOSIS — M25551 Pain in right hip: Secondary | ICD-10-CM | POA: Diagnosis not present

## 2017-06-19 DIAGNOSIS — M79605 Pain in left leg: Secondary | ICD-10-CM

## 2017-06-20 ENCOUNTER — Ambulatory Visit (HOSPITAL_COMMUNITY)
Admission: RE | Admit: 2017-06-20 | Discharge: 2017-06-20 | Disposition: A | Discharge status: Home / Self Care | Payer: Medicare Other | Source: Ambulatory Visit | Attending: Interventional Cardiology | Admitting: Interventional Cardiology

## 2017-06-20 DIAGNOSIS — M7989 Other specified soft tissue disorders: Secondary | ICD-10-CM | POA: Insufficient documentation

## 2017-06-20 DIAGNOSIS — M79605 Pain in left leg: Secondary | ICD-10-CM | POA: Insufficient documentation

## 2017-06-20 NOTE — Telephone Encounter (Signed)
Called patient and made him aware that Dr. Irish Lack would like for him to have LE Venous US of his left leg to r/o DVT. Made patient aware that his leg pain and swelling was probably due to having his legs down for a long period of time while he was driving, but we would order the [place] of his left leg to be sure. Patient requesting [place] of both legs because he says that he has had issues with the right side as well. Patient states that he would like to have both done for peace of mind. Order bilateral LE Venous US. Scheduled at [facility] today at 1:00 PM. Patient denies any current leg pain, swelling, SOB, or any other symptoms.  Also, reviewed echo results in great detail with the patient. As he had several questions regarding the medical terminology that he was able to see on MyChart. By the end of the conversation, patient was reassured that his heart function and valve function were both good and no changes are needed at this time. Patient verbalized understanding and was satisfied with the information provided.

## 2017-06-20 NOTE — Telephone Encounter (Signed)
Late entry:  Phone call received from [facility] lab at 1:45 PM today to report preliminary LE Venous US results as negative for DVT. Made technician aware that there were no further instructions for the patient at this time.

## 2017-06-20 NOTE — Progress Notes (Signed)
Bilateral lower extremity venous duplex completed. Preliminary results - There is no evidence of DVT, superficial thrombosis ,or Baker's cyst. Toma Copier, RVS  06/20/2017 1:48 PM

## 2017-06-26 DIAGNOSIS — M25511 Pain in right shoulder: Secondary | ICD-10-CM | POA: Diagnosis not present

## 2017-06-26 DIAGNOSIS — M25551 Pain in right hip: Secondary | ICD-10-CM | POA: Diagnosis not present

## 2017-06-26 DIAGNOSIS — M79604 Pain in right leg: Secondary | ICD-10-CM | POA: Diagnosis not present

## 2017-06-26 DIAGNOSIS — M542 Cervicalgia: Secondary | ICD-10-CM | POA: Diagnosis not present

## 2017-06-28 NOTE — Telephone Encounter (Signed)
Called patient in regards to MyChart message below. I apologized for the experience that he described regarding his billing concerns. Made patient aware that I spoke to [facility] in our billing department regarding the denial letter he received for the echo. Made him aware that they are still waiting to hear back from Medicare regarding approval and that they have not received a bill or anything regarding a denial as of yet. Made patient aware that their suggestion was to hold on to the letter for now and give it a little more time until they receive the information from Medicare. Patient verbalized understanding and was in agreement at this time.   Also spoke to the patient regarding his concerns for his leg veins and arteries. Made him aware that we did not do an ultrasound of his arteries, but his leg veins look good and that there was no evidence of a DVT. Patient states that he is not having any leg pain or swelling and has not had any since that time. Made patient aware that it was likely due to having his legs down for a long period of time during his trip. Patient states that he has another upcoming trip and he is going to see if it happens again. Instructed the patient to make frequent stops and get out and walk around. Instructed patient to let us know if his symptoms return. Patient verbalized understanding and thanked me for the call.

## 2017-07-04 DIAGNOSIS — M25551 Pain in right hip: Secondary | ICD-10-CM | POA: Diagnosis not present

## 2017-07-04 DIAGNOSIS — M25511 Pain in right shoulder: Secondary | ICD-10-CM | POA: Diagnosis not present

## 2017-07-04 DIAGNOSIS — M79604 Pain in right leg: Secondary | ICD-10-CM | POA: Diagnosis not present

## 2017-07-04 DIAGNOSIS — M542 Cervicalgia: Secondary | ICD-10-CM | POA: Diagnosis not present

## 2017-07-18 DIAGNOSIS — M25551 Pain in right hip: Secondary | ICD-10-CM | POA: Diagnosis not present

## 2017-07-18 DIAGNOSIS — M25511 Pain in right shoulder: Secondary | ICD-10-CM | POA: Diagnosis not present

## 2017-07-18 DIAGNOSIS — M542 Cervicalgia: Secondary | ICD-10-CM | POA: Diagnosis not present

## 2017-07-18 DIAGNOSIS — M79604 Pain in right leg: Secondary | ICD-10-CM | POA: Diagnosis not present

## 2017-08-06 ENCOUNTER — Other Ambulatory Visit: Payer: Medicare Other

## 2017-08-07 DIAGNOSIS — M542 Cervicalgia: Secondary | ICD-10-CM | POA: Diagnosis not present

## 2017-08-07 DIAGNOSIS — M25551 Pain in right hip: Secondary | ICD-10-CM | POA: Diagnosis not present

## 2017-08-07 DIAGNOSIS — M79604 Pain in right leg: Secondary | ICD-10-CM | POA: Diagnosis not present

## 2017-08-07 DIAGNOSIS — M25511 Pain in right shoulder: Secondary | ICD-10-CM | POA: Diagnosis not present

## 2017-08-13 ENCOUNTER — Ambulatory Visit: Payer: Medicare Other

## 2017-08-14 ENCOUNTER — Other Ambulatory Visit: Payer: Medicare Other | Admitting: *Deleted

## 2017-08-14 DIAGNOSIS — E782 Mixed hyperlipidemia: Secondary | ICD-10-CM

## 2017-08-15 LAB — BASIC METABOLIC PANEL
BUN/Creatinine Ratio: 17 (ref 10–24)
BUN: 19 mg/dL (ref 8–27)
CO2: 21 mmol/L (ref 20–29)
CREATININE: 1.15 mg/dL (ref 0.76–1.27)
Calcium: 9.6 mg/dL (ref 8.6–10.2)
Chloride: 101 mmol/L (ref 96–106)
GFR, EST AFRICAN AMERICAN: 75 mL/min/{1.73_m2} (ref >59)
GFR, EST NON AFRICAN AMERICAN: 65 mL/min/{1.73_m2} (ref >59)
Glucose: 78 mg/dL (ref 65–99)
POTASSIUM: 4.6 mmol/L (ref 3.5–5.2)
Sodium: 138 mmol/L (ref 134–144)

## 2017-08-15 LAB — NMR, LIPOPROFILE
CHOLESTEROL, TOTAL: 128 mg/dL (ref 100–199)
HDL Particle Number: 34.7 umol/L (ref >=30.5)
HDL-C: 50 mg/dL (ref >39)
LDL Particle Number: 844 nmol/L (ref <1000)
LDL SIZE: 20.1 nm — AB (ref >20.5)
LDL-C: 57 mg/dL (ref 0–99)
LP-IR Score: 30 (ref <=45)
Small LDL Particle Number: 522 nmol/L (ref <=527)
TRIGLYCERIDES: 104 mg/dL (ref 0–149)

## 2017-08-15 LAB — APOLIPOPROTEIN B: Apolipoprotein B: 80 mg/dL (ref <90)

## 2017-08-17 ENCOUNTER — Encounter: Payer: Self-pay | Admitting: Interventional Cardiology

## 2017-08-21 ENCOUNTER — Ambulatory Visit (INDEPENDENT_AMBULATORY_CARE_PROVIDER_SITE_OTHER): Payer: Medicare Other | Admitting: Pharmacist

## 2017-08-21 DIAGNOSIS — E782 Mixed hyperlipidemia: Secondary | ICD-10-CM | POA: Diagnosis not present

## 2017-08-21 DIAGNOSIS — I2510 Atherosclerotic heart disease of native coronary artery without angina pectoris: Secondary | ICD-10-CM

## 2017-08-21 NOTE — Progress Notes (Signed)
Patient ID: Noah [name]                 DOB: Sep 24, 1950                    MRN: [id number]     HPI: Noah [name] is a 67 y.o. male patient referred to lipid clinic by Dr. Irish Lack who is followed in the [facility] for his h/o elevated LDL-P and h/o CABG. Patient has been working with the lipid clinic for years for management of his cholesterol. His stress test was normal late 2013 showing no ischemia. 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   Patient presents in good spirits today for lipid follow-up. LDL remains at goal on most recent lipids however LDL-P has increased. Pt reports he was up in [place] for 3 weeks in May since his mother in law is in hospice now. He reports eating at restaurants daily. He gained 12 lbs when he was up in [place] however has lost 10 lbs of this since returning to [place]. He has been biking 10-12 miles each day.   Current Medications:  Rosuvastatin 20mg  daily Metamucil 4 tbsp daily prn   Intolerances: Lipitor 80mg  daily (myalgias), fish oil (stopped fish oil on own early 2013 due to report of possible prostate cancer with fish oil use) * Previously took and tolerated Benecol, but company stopped production   Risk Factors: CAD/CABG, HTN, age  LDL goal: < 70 LDL-P goal: < 700  Diet: Avoids red meat and fried foods.  Exercise: Rides bike 6.5-7 days per week.   Family History: The patient's family history includes Atrial fibrillation (age of onset: 63) in his mother; Colon polyps in his mother; Coronary artery disease in his father; Heart attack in his father, maternal grandfather, paternal grandfather, and paternal grandmother; Prostate cancer in his maternal uncle; Seizures in his brother.  Social History: The patient reports that he quit smoking about 42 years ago. His smoking use included Cigarettes. He has never used smokeless tobacco. He reports that he drinks alcohol. He reports that he does not use drugs.  Labs: 08/14/17: LDL-P 844, LDL 57, TG 104,  HDL 50, ApoB 80 01/23/17: LDL-P 646, LDL 48, TG 108, HDL 46, ApoB 73 07/2016: LDL-P 634, LDL 59, TG 124, HDL 44, ApoB 77 07/2015: LDL-P 1063, LDL 51, TG 190, HDL49, Lp(a) <10, ApoB 70 01/2015: LDL-P 1068, LDL 55, TG 136, HDL 48, Lp(a) <10, ApoB 69 07/2014: LDL-P 888, LDL 56, TG 112, HDL 46 11/2013: LDL-P 975, LDL 67, TG 115, HDL 47  06/2013: LDL-P1149, LDL 58, TG 108, HDL 50 (Crestor 20 mg qd, Benechol chews bid, metamucil bid, flax seed) 12/2012: LDL-P 918, LDL 56, non-HDL 74,TG 92, HDL 48 (Crestor 20 mg qd, Benecol chews bid, metamucil bid, flax seed).    Past Medical History:  Diagnosis Date  . Anxiety   . Arthritis   . Coronary artery disease 07/13/2005   CABG  . DDD (degenerative disc disease)   . DJD (degenerative joint disease)   . Elevated liver enzymes   . Hemangioma of liver   . Hyperlipemia   . PAC (premature atrial contraction)   . Paroxysmal atrial fibrillation (HCC)   . Pneumonia   . PVC (premature ventricular contraction)     Current Outpatient Medications on File Prior to Visit  Medication Sig Dispense Refill  . acetaminophen (TYLENOL) 325 MG tablet Take 650 mg by mouth every 6 (six) hours as needed for pain.    Marland Kitchen  aspirin 325 MG tablet Take 325 mg by mouth daily.    . Cholecalciferol (VITAMIN D) 2000 UNITS tablet Take 2,000 Units by mouth daily.    . Flaxseed, Linseed, POWD Take 2 scoop by mouth as directed.    Marland Kitchen LORazepam (ATIVAN) 0.5 MG tablet Take 1 tablet by mouth at bedtime as needed for sleep. To help with sleep    . metoprolol succinate (TOPROL-XL) 25 MG 24 hr tablet Take 1 tablet (25 mg total) by mouth 2 (two) times daily. Take with or immediately following a meal. **LANNETT MFR** 180 tablet 3  . metoprolol tartrate (LOPRESSOR) 25 MG tablet Take 25 mg by mouth daily as needed.    . Multiple Vitamin (MULTIVITAMIN WITH MINERALS) TABS Take 1 tablet by mouth daily. Centrum silver    . nitroGLYCERIN (NITROSTAT) 0.4 MG SL tablet Place 1 tablet (0.4 mg total)  under the tongue every 5 (five) minutes as needed for chest pain (3 DOSES MAX). 25 tablet 3  . pramoxine-hydrocortisone (ANALPRAM HC) cream Apply 1 application topically at bedtime as needed (for skin).     . Psyllium (METAMUCIL PO) 4 tablespoons by mouth daily as needed, for consitpation    . rosuvastatin (CRESTOR) 20 MG tablet TAKE 1 TABLET DAILY **     APOTEX MFR** 90 tablet 2  . Ubiquinol 100 MG CAPS Take 100 mg by mouth daily.     No current facility-administered medications on file prior to visit.     Allergies  Allergen Reactions  . Cephalexin Other (See Comments)    REACTION: tachycardia  . Levofloxacin Other (See Comments)    REACTION: tachycardia  . Ciprofloxacin Diarrhea  . Doxycycline Other (See Comments)  . Lipitor [Atorvastatin] Palpitations    Muscle aches on lipitor 80 mg daily  . Penicillins Rash and Other (See Comments)    REACTION: rash Unsure    Assessment/Plan:  1. Hyperlipidemia - LDL at goal < 70 however LDL-P has increased above goal < 700 secondary to dietary indiscretion when pt was up in [place]. LDL-P previously at goal on last 2 lipid panels (no medication changes since then). He has since returned to his exercise and low fat diet regimen. He does not wish to increase his rosuvastatin dose. Will continue rosuvastatin at 20mg  daily and recheck lipids in 6 months.    Megan E. Supple, PharmD, BCACP, [facility] [address], [place], [place] [address] Phone: [phone]; Fax: [phone] 08/21/2017 12:00 PM

## 2017-08-29 DIAGNOSIS — M542 Cervicalgia: Secondary | ICD-10-CM | POA: Diagnosis not present

## 2017-08-29 DIAGNOSIS — M79604 Pain in right leg: Secondary | ICD-10-CM | POA: Diagnosis not present

## 2017-08-29 DIAGNOSIS — M25511 Pain in right shoulder: Secondary | ICD-10-CM | POA: Diagnosis not present

## 2017-08-29 DIAGNOSIS — M25551 Pain in right hip: Secondary | ICD-10-CM | POA: Diagnosis not present

## 2017-09-10 DIAGNOSIS — M542 Cervicalgia: Secondary | ICD-10-CM | POA: Diagnosis not present

## 2017-09-10 DIAGNOSIS — M79604 Pain in right leg: Secondary | ICD-10-CM | POA: Diagnosis not present

## 2017-09-10 DIAGNOSIS — M25511 Pain in right shoulder: Secondary | ICD-10-CM | POA: Diagnosis not present

## 2017-09-10 DIAGNOSIS — M25551 Pain in right hip: Secondary | ICD-10-CM | POA: Diagnosis not present

## 2017-10-01 DIAGNOSIS — M542 Cervicalgia: Secondary | ICD-10-CM | POA: Diagnosis not present

## 2017-10-01 DIAGNOSIS — M79604 Pain in right leg: Secondary | ICD-10-CM | POA: Diagnosis not present

## 2017-10-01 DIAGNOSIS — M25511 Pain in right shoulder: Secondary | ICD-10-CM | POA: Diagnosis not present

## 2017-10-01 DIAGNOSIS — M25551 Pain in right hip: Secondary | ICD-10-CM | POA: Diagnosis not present

## 2017-10-09 DIAGNOSIS — R609 Edema, unspecified: Secondary | ICD-10-CM | POA: Diagnosis not present

## 2017-10-09 DIAGNOSIS — R0989 Other specified symptoms and signs involving the circulatory and respiratory systems: Secondary | ICD-10-CM | POA: Diagnosis not present

## 2017-10-09 DIAGNOSIS — F419 Anxiety disorder, unspecified: Secondary | ICD-10-CM | POA: Diagnosis not present

## 2017-10-09 DIAGNOSIS — K30 Functional dyspepsia: Secondary | ICD-10-CM | POA: Diagnosis not present

## 2017-10-22 ENCOUNTER — Ambulatory Visit (INDEPENDENT_AMBULATORY_CARE_PROVIDER_SITE_OTHER): Payer: Medicare Other | Admitting: Physician Assistant

## 2017-10-22 ENCOUNTER — Encounter: Payer: Self-pay | Admitting: Physician Assistant

## 2017-10-22 VITALS — BP 132/74 | HR 68 | Ht 68.0 in | Wt 172.2 lb

## 2017-10-22 DIAGNOSIS — I10 Essential (primary) hypertension: Secondary | ICD-10-CM

## 2017-10-22 DIAGNOSIS — I480 Paroxysmal atrial fibrillation: Secondary | ICD-10-CM | POA: Diagnosis not present

## 2017-10-22 DIAGNOSIS — I2510 Atherosclerotic heart disease of native coronary artery without angina pectoris: Secondary | ICD-10-CM

## 2017-10-22 DIAGNOSIS — I493 Ventricular premature depolarization: Secondary | ICD-10-CM

## 2017-10-22 MED ORDER — ASPIRIN EC 81 MG PO TBEC: 81.0000 mg | DELAYED_RELEASE_TABLET | Freq: Every day | ORAL | Status: AC

## 2017-10-22 MED ORDER — METOPROLOL TARTRATE 25 MG PO TABS: ORAL_TABLET | ORAL | 3 refills | Status: DC

## 2017-10-22 NOTE — Progress Notes (Signed)
Cardiology Office Note:    Date:  10/22/2017   ID:  Noah [name], DOB September 01, 1950, MRN [id number]  PCP:  Lawerance Cruel, MD  Cardiologist:  Larae Grooms, MD   Referring MD: Lawerance Cruel, MD   Chief Complaint  Patient presents with  . Palpitations    History of Present Illness:    Noah [name] is a 67 y.o. male with coronary artery disease status post CABG in 2007, remote paroxysmal atrial fibrillation (noted many years ago), hyperlipidemia (intolerant of some statins).   Last seen by Dr. Irish Lack 04/02/2017.  He has had some palpitations.  48-hour Holter monitor demonstrated normal rhythm, PVCs and PACs.  There was no atrial fibrillation noted.     [name] returns for further evaluation of palpitations.  He has had palpitations related to PVCs for most of his life.  Recently, he has noted increasing frequency.  He denies chest discomfort, shortness of breath, syncope, paroxysmal nocturnal dyspnea or lower extremity swelling.  He takes metoprolol succinate 25 mg twice a day.  He used to take an extra half of a 25 mg tablet to help with his palpitations.  However, this has not been as helpful as it has been in the past.  He does have metoprolol tartrate and would like to know if he can take this medication.  He exercises on a regular basis without significant limitations.  Of note, his daughter-in-law had a complicated pregnancy for about 6 months prior to delivering in the last several weeks.  Prior CV studies:   The following studies were reviewed today:  Echocardiogram 06/10/2017 Mild LVH, EF 65-70, normal wall motion, grade 1 diastolic dysfunction, aortic sclerosis without stenosis, trivial AI, mild LAE  48-hour Holter 04/01/2017 Normal sinus rhythm, PACs and PVCs correlate with symptoms; no significant arrhythmias  Nuclear stress test 10/23/2016 EF 67, no ischemia, low risk  Carotid US 04/27/2015 Bilateral ICA 1-39; follow-up as needed  Echo  02/26/2014 EF 55-60, mild AI, mild LAE  Cardiac catheterization 07/13/2005 LAD proximal 90 LCx ostial 80 RCA proximal 30, distal 70 Normal LV function >> CABG  Past Medical History:  Diagnosis Date  . Anxiety   . Arthritis   . Coronary artery disease 07/13/2005   CABG  . DDD (degenerative disc disease)   . DJD (degenerative joint disease)   . Elevated liver enzymes   . Hemangioma of liver   . Hyperlipemia   . PAC (premature atrial contraction)   . Paroxysmal atrial fibrillation (HCC)   . Pneumonia   . PVC (premature ventricular contraction)    Surgical Hx: The patient  has a past surgical history that includes Coronary artery bypass graft (07/13/2005); Knee surgery (Right); and Inguinal hernia repair (Right, 1978, 2000).   Current Medications: Current Meds  Medication Sig  . acetaminophen (TYLENOL) 325 MG tablet Take 650 mg by mouth every 6 (six) hours as needed for pain.  . Cholecalciferol (VITAMIN D) 2000 UNITS tablet Take 2,000 Units by mouth daily.  . Flaxseed, Linseed, POWD Take 2 scoop by mouth as directed.  Marland Kitchen LORazepam (ATIVAN) 0.5 MG tablet Take 1 tablet by mouth at bedtime as needed for sleep. To help with sleep  . metoprolol succinate (TOPROL-XL) 25 MG 24 hr tablet Take 1 tablet (25 mg total) by mouth 2 (two) times daily. Take with or immediately following a meal. **LANNETT MFR**  . Multiple Vitamin (MULTIVITAMIN WITH MINERALS) TABS Take 1 tablet by mouth daily. Centrum silver  . nitroGLYCERIN (NITROSTAT) 0.4  MG SL tablet Place 1 tablet (0.4 mg total) under the tongue every 5 (five) minutes as needed for chest pain (3 DOSES MAX).  Marland Kitchen pramoxine-hydrocortisone (ANALPRAM HC) cream Apply 1 application topically at bedtime as needed (for skin).   . Psyllium (METAMUCIL PO) 4 tablespoons by mouth daily as needed, for consitpation  . rosuvastatin (CRESTOR) 20 MG tablet TAKE 1 TABLET DAILY **     APOTEX MFR**  . Ubiquinol 100 MG CAPS Take 100 mg by mouth daily.  . [DISCONTINUED]  aspirin 325 MG tablet Take 325 mg by mouth daily.  . [DISCONTINUED] metoprolol tartrate (LOPRESSOR) 25 MG tablet Take 25 mg by mouth daily as needed.     Allergies:   Cephalexin; Levofloxacin; Ciprofloxacin; Doxycycline; Lipitor [atorvastatin]; and Penicillins   Social History   Tobacco Use  . Smoking status: Former Smoker    Types: Cigarettes    Last attempt to quit: 02/19/1974    Years since quitting: 43.7  . Smokeless tobacco: Never Used  . Tobacco comment: stopped 32 yrs ago  Substance Use Topics  . Alcohol use: Yes    Comment: rare  . Drug use: No     Family Hx: The patient's family history includes Atrial fibrillation (age of onset: 27) in his mother; Colon polyps in his mother; Coronary artery disease in his father; Heart attack in his father, maternal grandfather, paternal grandfather, and paternal grandmother; Prostate cancer in his maternal uncle; Seizures in his brother.  ROS:   Please see the history of present illness.    Review of Systems  Cardiovascular: Positive for irregular heartbeat.   All other systems reviewed and are negative.   EKGs/Labs/Other Test Reviewed:    EKG:  EKG is  ordered today.  The ekg ordered today demonstrates NSR, HR 68, LAD, QTc 387, similar to old EKGs  Recent Labs: 08/14/2017: BUN 19; Creatinine, Ser 1.15; Potassium 4.6; Sodium 138   Recent Lipid Panel Lab Results  Component Value Date/Time   CHOL 116 01/23/2017 10:30 AM   CHOL 121 02/09/2016 12:06 PM   CHOL 124 07/28/2014 09:03 AM   TRIG 108 01/23/2017 10:30 AM   TRIG 112 07/28/2014 09:03 AM   HDL 46 01/23/2017 10:30 AM   HDL 46 07/28/2014 09:03 AM   CHOLHDL 2.5 02/09/2016 12:06 PM   LDLCALC 53 02/09/2016 12:06 PM   LDLCALC 56 07/28/2014 09:03 AM    Physical Exam:    VS:  BP 132/74   Pulse 68   Ht 5\' 8"  (1.727 m)   Wt 172 lb 3.2 oz (78.1 kg)   SpO2 98%   BMI 26.18 kg/m     Wt Readings from Last 3 Encounters:  10/22/17 172 lb 3.2 oz (78.1 kg)  04/02/17 178 lb 1.9  oz (80.8 kg)  02/06/17 182 lb (82.6 kg)     Physical Exam  Constitutional: He is oriented to person, place, and time. He appears well-developed and well-nourished. No distress.  HENT:  Head: Normocephalic and atraumatic.  Neck: Neck supple. No JVD present. No thyromegaly present.  Cardiovascular: Normal rate, regular rhythm, S1 normal and S2 normal.  No murmur heard. Pulmonary/Chest: Breath sounds normal. He has no rales.  Abdominal: Soft. There is no hepatomegaly.  Musculoskeletal: He exhibits no edema.  Lymphadenopathy:    He has no cervical adenopathy.  Neurological: He is alert and oriented to person, place, and time.  Skin: Skin is warm and dry.  Psychiatric: He has a normal mood and affect.    ASSESSMENT &  PLAN:    PVC's (premature ventricular contractions)  Symptomatic PVCs.  I suspect that the increased stress related to his daughter-in-law may have exacerbated his symptoms recently.  He had a Holter monitor done in February 2019.  Diary entries correlated with PVCs.  He has not had any change in the quality of his palpitations since that time.  I have encouraged him to avoid caffeine and other stimulants.  He may take metoprolol tartrate 12.5 to 25 mg daily as needed for palpitations.  If he continues to have symptoms that do not respond to his current beta-blocker regimen, he may follow-up for further evaluation.  We also discussed looking into the fourth-generation apple watch to help with monitoring his symptoms.  Coronary artery disease involving native coronary artery of native heart without angina pectoris History of CABG in 2007.  Low risk nuclear stress test in 2018.  Continue aspirin, beta-blocker.  I have asked him to decrease his dose of aspirin 81 mg daily.  Paroxysmal atrial fibrillation (HCC)  Remote history.  Recent Holter without evidence of recurrent atrial fibrillation.  Essential hypertension, benign   The patient's blood pressure is controlled on his  current regimen.  Continue current therapy.   Dispo:  Return in about 6 months (around 04/22/2018) for Routine Follow Up w/ Dr. Irish Lack.   Medication Adjustments/Labs and Tests Ordered: Current medicines are reviewed at length with the patient today.  Concerns regarding medicines are outlined above.  Tests Ordered: Orders Placed This Encounter  Procedures  . EKG 12-Lead   Medication Changes: Meds ordered this encounter  Medications  . metoprolol tartrate (LOPRESSOR) 25 MG tablet    Sig: Take 1/2 -1 tab as needed    Dispense:  180 tablet    Refill:  3  . aspirin EC 81 MG tablet    Sig: Take 1 tablet (81 mg total) by mouth daily.    Signed, Richardson Dopp, PA-C  10/22/2017 4:53 PM    [facility] [place], [place], [place]  [address] Phone: [phone]; Fax: [phone]

## 2017-10-22 NOTE — Patient Instructions (Addendum)
Medication Instructions:  1. OK TO TAKE METOPROLOL TARTRATE 25 MG TABLET WITH DIRECTIONS OK TO TAKE 1/2-1 TABLET DAILY AS NEEDED  2. DECREASE ASPIRIN TO 81 MG DAILY  3. NO OTHER CHANGES HAVE BEEN MADE WITH YOUR MEDICATIONS TODAY  Labwork: NONE ORDERED TODAY  Testing/Procedures: NONE ORDERED TODAY  Follow-Up: 6 MONTHS WITH DR. VARANASI   Any Other Special Instructions Will Be Listed Below (If Applicable).     If you need a refill on your cardiac medications before your next appointment, please call your pharmacy.

## 2017-11-06 DIAGNOSIS — Z23 Encounter for immunization: Secondary | ICD-10-CM | POA: Diagnosis not present

## 2017-11-07 DIAGNOSIS — D1801 Hemangioma of skin and subcutaneous tissue: Secondary | ICD-10-CM | POA: Diagnosis not present

## 2017-11-07 DIAGNOSIS — L820 Inflamed seborrheic keratosis: Secondary | ICD-10-CM | POA: Diagnosis not present

## 2017-11-07 DIAGNOSIS — B078 Other viral warts: Secondary | ICD-10-CM | POA: Diagnosis not present

## 2017-11-07 DIAGNOSIS — L570 Actinic keratosis: Secondary | ICD-10-CM | POA: Diagnosis not present

## 2017-11-07 DIAGNOSIS — L821 Other seborrheic keratosis: Secondary | ICD-10-CM | POA: Diagnosis not present

## 2017-11-08 DIAGNOSIS — H2513 Age-related nuclear cataract, bilateral: Secondary | ICD-10-CM | POA: Diagnosis not present

## 2017-11-12 DIAGNOSIS — M25551 Pain in right hip: Secondary | ICD-10-CM | POA: Diagnosis not present

## 2017-11-12 DIAGNOSIS — M542 Cervicalgia: Secondary | ICD-10-CM | POA: Diagnosis not present

## 2017-11-12 DIAGNOSIS — M25511 Pain in right shoulder: Secondary | ICD-10-CM | POA: Diagnosis not present

## 2017-11-12 DIAGNOSIS — M79604 Pain in right leg: Secondary | ICD-10-CM | POA: Diagnosis not present

## 2017-11-18 DIAGNOSIS — M542 Cervicalgia: Secondary | ICD-10-CM | POA: Diagnosis not present

## 2017-11-18 DIAGNOSIS — M79604 Pain in right leg: Secondary | ICD-10-CM | POA: Diagnosis not present

## 2017-11-18 DIAGNOSIS — M25551 Pain in right hip: Secondary | ICD-10-CM | POA: Diagnosis not present

## 2017-11-18 DIAGNOSIS — M25511 Pain in right shoulder: Secondary | ICD-10-CM | POA: Diagnosis not present

## 2017-11-19 ENCOUNTER — Other Ambulatory Visit: Payer: Self-pay | Admitting: Interventional Cardiology

## 2017-12-02 DIAGNOSIS — M25511 Pain in right shoulder: Secondary | ICD-10-CM | POA: Diagnosis not present

## 2017-12-02 DIAGNOSIS — M25551 Pain in right hip: Secondary | ICD-10-CM | POA: Diagnosis not present

## 2017-12-02 DIAGNOSIS — M542 Cervicalgia: Secondary | ICD-10-CM | POA: Diagnosis not present

## 2017-12-02 DIAGNOSIS — M79604 Pain in right leg: Secondary | ICD-10-CM | POA: Diagnosis not present

## 2017-12-09 DIAGNOSIS — M25551 Pain in right hip: Secondary | ICD-10-CM | POA: Diagnosis not present

## 2017-12-09 DIAGNOSIS — M542 Cervicalgia: Secondary | ICD-10-CM | POA: Diagnosis not present

## 2017-12-09 DIAGNOSIS — M79604 Pain in right leg: Secondary | ICD-10-CM | POA: Diagnosis not present

## 2017-12-09 DIAGNOSIS — M25511 Pain in right shoulder: Secondary | ICD-10-CM | POA: Diagnosis not present

## 2017-12-27 DIAGNOSIS — Z125 Encounter for screening for malignant neoplasm of prostate: Secondary | ICD-10-CM | POA: Diagnosis not present

## 2017-12-27 DIAGNOSIS — K649 Unspecified hemorrhoids: Secondary | ICD-10-CM | POA: Diagnosis not present

## 2017-12-27 DIAGNOSIS — I700 Atherosclerosis of aorta: Secondary | ICD-10-CM | POA: Diagnosis not present

## 2017-12-27 DIAGNOSIS — Z0000 Encounter for general adult medical examination without abnormal findings: Secondary | ICD-10-CM | POA: Diagnosis not present

## 2017-12-27 DIAGNOSIS — Z79899 Other long term (current) drug therapy: Secondary | ICD-10-CM | POA: Diagnosis not present

## 2017-12-27 DIAGNOSIS — E782 Mixed hyperlipidemia: Secondary | ICD-10-CM | POA: Diagnosis not present

## 2017-12-27 DIAGNOSIS — Z23 Encounter for immunization: Secondary | ICD-10-CM | POA: Diagnosis not present

## 2017-12-27 DIAGNOSIS — Z1159 Encounter for screening for other viral diseases: Secondary | ICD-10-CM | POA: Diagnosis not present

## 2017-12-27 DIAGNOSIS — F411 Generalized anxiety disorder: Secondary | ICD-10-CM | POA: Diagnosis not present

## 2018-01-09 ENCOUNTER — Other Ambulatory Visit: Payer: Self-pay

## 2018-01-09 DIAGNOSIS — Z125 Encounter for screening for malignant neoplasm of prostate: Secondary | ICD-10-CM

## 2018-01-09 DIAGNOSIS — R972 Elevated prostate specific antigen [PSA]: Secondary | ICD-10-CM

## 2018-01-09 NOTE — Telephone Encounter (Signed)
See prior MyChart message.

## 2018-01-14 ENCOUNTER — Other Ambulatory Visit: Payer: Medicare Other

## 2018-01-15 ENCOUNTER — Other Ambulatory Visit: Payer: Medicare Other | Admitting: *Deleted

## 2018-01-15 DIAGNOSIS — Z125 Encounter for screening for malignant neoplasm of prostate: Secondary | ICD-10-CM | POA: Diagnosis not present

## 2018-01-15 DIAGNOSIS — E782 Mixed hyperlipidemia: Secondary | ICD-10-CM

## 2018-01-15 DIAGNOSIS — R972 Elevated prostate specific antigen [PSA]: Secondary | ICD-10-CM

## 2018-01-16 LAB — PSA: Prostate Specific Ag, Serum: 0.5 ng/mL (ref 0.0–4.0)

## 2018-01-16 LAB — NMR, LIPOPROFILE
CHOLESTEROL, TOTAL: 143 mg/dL (ref 100–199)
HDL Particle Number: 36.1 umol/L (ref >=30.5)
HDL-C: 56 mg/dL (ref >39)
LDL PARTICLE NUMBER: 825 nmol/L (ref <1000)
LDL SIZE: 20 nm — AB (ref >20.5)
LDL-C: 69 mg/dL (ref 0–99)
LP-IR SCORE: 33 (ref <=45)
SMALL LDL PARTICLE NUMBER: 490 nmol/L (ref <=527)
TRIGLYCERIDES: 91 mg/dL (ref 0–149)

## 2018-01-16 LAB — APOLIPOPROTEIN B: Apolipoprotein B: 83 mg/dL (ref <90)

## 2018-01-22 ENCOUNTER — Encounter: Payer: Self-pay | Admitting: Interventional Cardiology

## 2018-01-22 ENCOUNTER — Ambulatory Visit (INDEPENDENT_AMBULATORY_CARE_PROVIDER_SITE_OTHER): Payer: Medicare Other | Admitting: Interventional Cardiology

## 2018-01-22 VITALS — BP 112/60 | HR 72 | Ht 68.0 in | Wt 170.8 lb

## 2018-01-22 DIAGNOSIS — E782 Mixed hyperlipidemia: Secondary | ICD-10-CM | POA: Diagnosis not present

## 2018-01-22 DIAGNOSIS — Z136 Encounter for screening for cardiovascular disorders: Secondary | ICD-10-CM | POA: Diagnosis not present

## 2018-01-22 DIAGNOSIS — I2510 Atherosclerotic heart disease of native coronary artery without angina pectoris: Secondary | ICD-10-CM

## 2018-01-22 DIAGNOSIS — I10 Essential (primary) hypertension: Secondary | ICD-10-CM

## 2018-01-22 DIAGNOSIS — I493 Ventricular premature depolarization: Secondary | ICD-10-CM | POA: Diagnosis not present

## 2018-01-22 DIAGNOSIS — I480 Paroxysmal atrial fibrillation: Secondary | ICD-10-CM

## 2018-01-22 NOTE — Progress Notes (Signed)
Cardiology Office Note   Date:  01/22/2018   ID:  Noah [name], DOB 02-11-1951, MRN [id number]  PCP:  Lawerance Cruel, MD    No chief complaint on file.  CAD  Wt Readings from Last 3 Encounters:  01/22/18 170 lb 12.8 oz (77.5 kg)  10/22/17 172 lb 3.2 oz (78.1 kg)  04/02/17 178 lb 1.9 oz (80.8 kg)       History of Present Illness: Noah [name] is a 67 y.o. male  with coronary artery disease status post CABG in 2007, remote paroxysmal atrial fibrillation (noted many years ago), hyperlipidemia (intolerant of some statins).   In 2/19,  He has had some palpitations.  48-hour Holter monitor demonstrated normal rhythm, PVCs and PACs.  There was no atrial fibrillation noted.   He was evaluated for palpitations in the office in   9/19. He was under stress.  He was taking an extra half of metoprolol with less benefit than usual.   Echocardiogram 06/10/2017 Mild LVH, EF 65-70, normal wall motion, grade 1 diastolic dysfunction, aortic sclerosis without stenosis, trivial AI, mild LAE  48-hour Holter 04/01/2017 Normal sinus rhythm, PACs and PVCs correlate with symptoms; no significant arrhythmias  Nuclear stress test 10/23/2016 EF 67, no ischemia, low risk  Carotid US 04/27/2015 Bilateral ICA 1-39; follow-up as needed  Since the last visit, he has had some flucuating heart rates while exercising.  He tolerates riding the bike well.    Denies : Chest pain. Dizziness. Leg edema. Nitroglycerin use. Orthopnea. Paroxysmal nocturnal dyspnea. Shortness of breath. Syncope.   Was concerned that his Total cholesterol went up.  HDL went up as well, so he was relieved.  He has occasionally used an extra half metoprolol to help with    Past Medical History:  Diagnosis Date  . Anxiety   . Arthritis   . Coronary artery disease 07/13/2005   CABG  . DDD (degenerative disc disease)   . DJD (degenerative joint disease)   . Elevated liver enzymes   . Hemangioma of liver   .  Hyperlipemia   . PAC (premature atrial contraction)   . Paroxysmal atrial fibrillation (HCC)   . Pneumonia   . PVC (premature ventricular contraction)     Past Surgical History:  Procedure Laterality Date  . CORONARY ARTERY BYPASS GRAFT  07/13/2005  . INGUINAL HERNIA REPAIR Right 1978, 2000   x 2  . KNEE SURGERY Right      Current Outpatient Medications  Medication Sig Dispense Refill  . aspirin EC 81 MG tablet Take 1 tablet (81 mg total) by mouth daily.    . Cholecalciferol (VITAMIN D) 2000 UNITS tablet Take 2,000 Units by mouth daily.    . Flaxseed, Linseed, POWD Take 2 scoop by mouth as directed.    Marland Kitchen ibuprofen (ADVIL,MOTRIN) 200 MG tablet Take 200 mg by mouth every 6 (six) hours as needed.    Marland Kitchen LORazepam (ATIVAN) 0.5 MG tablet Take 1 tablet by mouth at bedtime as needed for sleep. To help with sleep    . metoprolol succinate (TOPROL-XL) 25 MG 24 hr tablet Take 1 tablet (25 mg total) by mouth 2 (two) times daily. Take with or immediately following a meal. **LANNETT MFR** 180 tablet 3  . metoprolol tartrate (LOPRESSOR) 25 MG tablet Take 1/2 -1 tab as needed 180 tablet 3  . Multiple Vitamin (MULTIVITAMIN WITH MINERALS) TABS Take 1 tablet by mouth daily. Centrum silver    . nitroGLYCERIN (NITROSTAT) 0.4 MG SL  tablet Place 1 tablet (0.4 mg total) under the tongue every 5 (five) minutes as needed for chest pain (3 DOSES MAX). 25 tablet 3  . pramoxine-hydrocortisone (ANALPRAM HC) cream Apply 1 application topically at bedtime as needed (for skin).     . Psyllium (METAMUCIL PO) 4 tablespoons by mouth daily as needed, for consitpation    . rosuvastatin (CRESTOR) 20 MG tablet Take 1 tablet (20 mg total) by mouth daily. 90 tablet 3  . Ubiquinol 100 MG CAPS Take 100 mg by mouth daily.     No current facility-administered medications for this visit.     Allergies:   Cephalexin; Levofloxacin; Ciprofloxacin; Doxycycline; Lipitor [atorvastatin]; and Penicillins    Social History:  The  patient  reports that he quit smoking about 43 years ago. His smoking use included cigarettes. He has never used smokeless tobacco. He reports that he drinks alcohol. He reports that he does not use drugs.   Family History:  The patient's family history includes Atrial fibrillation (age of onset: 73) in his mother; Colon polyps in his mother; Coronary artery disease in his father; Heart attack in his father, maternal grandfather, paternal grandfather, and paternal grandmother; Prostate cancer in his maternal uncle; Seizures in his brother.    ROS:  Please see the history of present illness.   Otherwise, review of systems are positive for rare palpitations.   All other systems are reviewed and negative.    PHYSICAL EXAM: VS:  BP 112/60   Pulse 72   Ht 5\' 8"  (1.727 m)   Wt 170 lb 12.8 oz (77.5 kg)   SpO2 98%   BMI 25.97 kg/m  , BMI Body mass index is 25.97 kg/m. GEN: Well nourished, well developed, in no acute distress  HEENT: normal  Neck: no JVD, carotid bruits, or masses Cardiac: RRR; no murmurs, rubs, or gallops,no edema  Respiratory:  clear to auscultation bilaterally, normal work of breathing GI: soft, nontender, nondistended, + BS MS: no deformity or atrophy  Skin: warm and dry, no rash Neuro:  Strength and sensation are intact Psych: euthymic mood, full affect   EKG:   The ekg ordered in 9/19 demonstrates NSR, no ST chnages   Recent Labs: 08/14/2017: BUN 19; Creatinine, Ser 1.15; Potassium 4.6; Sodium 138   Lipid Panel    Component Value Date/Time   CHOL 116 01/23/2017 1030   CHOL 121 02/09/2016 1206   CHOL 124 07/28/2014 0903   TRIG 108 01/23/2017 1030   TRIG 112 07/28/2014 0903   HDL 46 01/23/2017 1030   HDL 46 07/28/2014 0903   CHOLHDL 2.5 02/09/2016 1206   LDLCALC 53 02/09/2016 1206   LDLCALC 56 07/28/2014 0903     Other studies Reviewed: Additional studies/ records that were reviewed today with results demonstrating: as above.   ASSESSMENT AND  PLAN:  1. CAD: no angina.  Continue aggressive seconadry prevention.  2. PVCs: COntinue beta blocker.  Sx controlled.  3. PAF: No AF on most recent monitor.  4. HTN:The current medical regimen is effective;  continue present plan and medications. 5. Hyperlipidemia: Normal particle size. Continue current meds. 6. Given h/o smoking, screen for AAA with u/s.    Current medicines are reviewed at length with the patient today.  The patient concerns regarding his medicines were addressed.  The following changes have been made:  No change  Labs/ tests ordered today include:   Orders Placed This Encounter  Procedures  . NMR, lipoprofile  . Apolipoprotein B  .  Comprehensive metabolic panel    Recommend 150 minutes/week of aerobic exercise Low fat, low carb, high fiber diet recommended  Disposition:   FU in 1 year   Signed, Larae Grooms, MD  01/22/2018 6:02 PM    [facility] [place], [place], [place]  [address] Phone: [phone]; Fax: [phone]

## 2018-01-22 NOTE — Patient Instructions (Signed)
Medication Instructions:  Your physician recommends that you continue on your current medications as directed. Please refer to the Current Medication list given to you today.  If you need a refill on your cardiac medications before your next appointment, please call your pharmacy.   Lab work: None Ordered  If you have labs (blood work) drawn today and your tests are completely normal, you will receive your results only by: . MyChart Message (if you have MyChart) OR . A paper copy in the mail If you have any lab test that is abnormal or we need to change your treatment, we will call you to review the results.  Testing/Procedures: Your physician has requested that you have an abdominal aorta duplex. During this test, an ultrasound is used to evaluate the aorta. Allow 30 minutes for this exam. Do not eat after midnight the day before and avoid carbonated beverages   Follow-Up: At [facility], you and your health needs are our priority.  As part of our continuing mission to provide you with exceptional heart care, we have created designated Provider Care Teams.  These Care Teams include your primary Cardiologist (physician) and Advanced Practice Providers (APPs -  Physician Assistants and Nurse Practitioners) who all work together to provide you with the care you need, when you need it. . You will need a follow up appointment in 1 year.  Please call our office 2 months in advance to schedule this appointment.  You may see Jay Varanasi, MD or one of the following Advanced Practice Providers on your designated Care Team:   . Brittainy Simmons, PA-C . Dayna Dunn, PA-C . Michele Lenze, PA-C  Any Other Special Instructions Will Be Listed Below (If Applicable).    

## 2018-01-23 ENCOUNTER — Other Ambulatory Visit: Payer: Self-pay | Admitting: Interventional Cardiology

## 2018-01-23 DIAGNOSIS — I2510 Atherosclerotic heart disease of native coronary artery without angina pectoris: Secondary | ICD-10-CM

## 2018-01-23 DIAGNOSIS — Z136 Encounter for screening for cardiovascular disorders: Secondary | ICD-10-CM

## 2018-02-05 ENCOUNTER — Other Ambulatory Visit: Payer: Medicare Other

## 2018-02-06 ENCOUNTER — Ambulatory Visit (HOSPITAL_COMMUNITY)
Admission: RE | Admit: 2018-02-06 | Discharge: 2018-02-06 | Disposition: A | Discharge status: Home / Self Care | Payer: Medicare Other | Source: Ambulatory Visit | Attending: Cardiology | Admitting: Cardiology

## 2018-02-06 DIAGNOSIS — Z136 Encounter for screening for cardiovascular disorders: Secondary | ICD-10-CM | POA: Diagnosis not present

## 2018-02-06 DIAGNOSIS — I2510 Atherosclerotic heart disease of native coronary artery without angina pectoris: Secondary | ICD-10-CM | POA: Insufficient documentation

## 2018-02-06 DIAGNOSIS — M542 Cervicalgia: Secondary | ICD-10-CM | POA: Diagnosis not present

## 2018-02-06 DIAGNOSIS — M79604 Pain in right leg: Secondary | ICD-10-CM | POA: Diagnosis not present

## 2018-02-06 DIAGNOSIS — M25511 Pain in right shoulder: Secondary | ICD-10-CM | POA: Diagnosis not present

## 2018-02-06 DIAGNOSIS — M25551 Pain in right hip: Secondary | ICD-10-CM | POA: Diagnosis not present

## 2018-02-24 DIAGNOSIS — M25551 Pain in right hip: Secondary | ICD-10-CM | POA: Diagnosis not present

## 2018-02-24 DIAGNOSIS — M25511 Pain in right shoulder: Secondary | ICD-10-CM | POA: Diagnosis not present

## 2018-02-24 DIAGNOSIS — M542 Cervicalgia: Secondary | ICD-10-CM | POA: Diagnosis not present

## 2018-02-24 DIAGNOSIS — M79604 Pain in right leg: Secondary | ICD-10-CM | POA: Diagnosis not present

## 2018-03-10 DIAGNOSIS — M79604 Pain in right leg: Secondary | ICD-10-CM | POA: Diagnosis not present

## 2018-03-10 DIAGNOSIS — M25551 Pain in right hip: Secondary | ICD-10-CM | POA: Diagnosis not present

## 2018-03-10 DIAGNOSIS — M542 Cervicalgia: Secondary | ICD-10-CM | POA: Diagnosis not present

## 2018-03-10 DIAGNOSIS — M25511 Pain in right shoulder: Secondary | ICD-10-CM | POA: Diagnosis not present

## 2018-03-11 DIAGNOSIS — F411 Generalized anxiety disorder: Secondary | ICD-10-CM | POA: Diagnosis not present

## 2018-03-11 DIAGNOSIS — L820 Inflamed seborrheic keratosis: Secondary | ICD-10-CM | POA: Diagnosis not present

## 2018-03-11 DIAGNOSIS — R109 Unspecified abdominal pain: Secondary | ICD-10-CM | POA: Diagnosis not present

## 2018-03-11 DIAGNOSIS — M6740 Ganglion, unspecified site: Secondary | ICD-10-CM | POA: Diagnosis not present

## 2018-03-11 DIAGNOSIS — M7130 Other bursal cyst, unspecified site: Secondary | ICD-10-CM | POA: Diagnosis not present

## 2018-04-10 DIAGNOSIS — M542 Cervicalgia: Secondary | ICD-10-CM | POA: Diagnosis not present

## 2018-04-10 DIAGNOSIS — M5440 Lumbago with sciatica, unspecified side: Secondary | ICD-10-CM | POA: Diagnosis not present

## 2018-04-10 DIAGNOSIS — R05 Cough: Secondary | ICD-10-CM | POA: Diagnosis not present

## 2018-04-24 IMAGING — NM NM MISC PROCEDURE
5 series · 30 of 30 positions shown · non-contrast
Comparison: none

[Series 1: wbr_s-proj_st stress-sum-em · 6.40mm/px · 6 of 64 frames shown]
[frame 6/64]
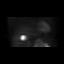
[frame 16/64]
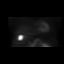
[frame 27/64]
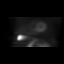
[frame 38/64]
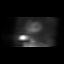
[frame 48/64]
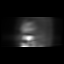
[frame 59/64]
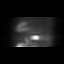

[Series 1: stress-gsp · 6.40mm/px · 6 of 512 frames shown]
[frame 43/512]
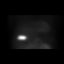
[frame 128/512]
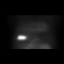
[frame 214/512]
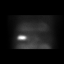
[frame 299/512]
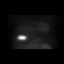
[frame 384/512]
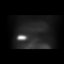
[frame 470/512]
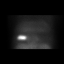

[Series 1: stress-sum-em · 6.40mm/px · 6 of 64 frames shown]
[frame 6/64]
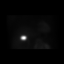
[frame 16/64]
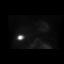
[frame 27/64]
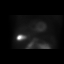
[frame 38/64]
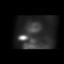
[frame 48/64]
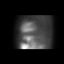
[frame 59/64]
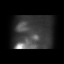

[Series 1: rest · 6.40mm/px · 6 of 64 frames shown]
[frame 6/64]
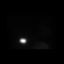
[frame 16/64]
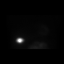
[frame 27/64]
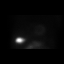
[frame 38/64]
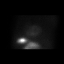
[frame 48/64]
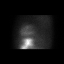
[frame 59/64]
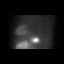

[Series 1: wbr_s-proj_st stress-gsp · 6.40mm/px · 6 of 512 frames shown]
[frame 43/512]
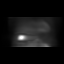
[frame 128/512]
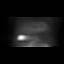
[frame 214/512]
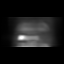
[frame 299/512]
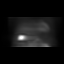
[frame 384/512]
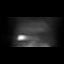
[frame 470/512]
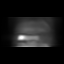

[30 of 30 positions shown; findings below may reference images not displayed]

Canned report from images found in remote index.

Refer to host system for actual result text.

## 2018-05-01 ENCOUNTER — Other Ambulatory Visit: Payer: Self-pay | Admitting: Interventional Cardiology

## 2018-05-20 DIAGNOSIS — J029 Acute pharyngitis, unspecified: Secondary | ICD-10-CM | POA: Diagnosis not present

## 2018-05-20 DIAGNOSIS — K30 Functional dyspepsia: Secondary | ICD-10-CM | POA: Diagnosis not present

## 2018-06-23 DIAGNOSIS — M179 Osteoarthritis of knee, unspecified: Secondary | ICD-10-CM | POA: Diagnosis not present

## 2018-06-23 DIAGNOSIS — F411 Generalized anxiety disorder: Secondary | ICD-10-CM | POA: Diagnosis not present

## 2018-06-23 DIAGNOSIS — R1013 Epigastric pain: Secondary | ICD-10-CM | POA: Diagnosis not present

## 2018-07-23 ENCOUNTER — Telehealth: Payer: Self-pay | Admitting: *Deleted

## 2018-07-23 NOTE — Telephone Encounter (Signed)

## 2018-07-24 ENCOUNTER — Other Ambulatory Visit: Payer: Medicare Other | Admitting: *Deleted

## 2018-07-24 ENCOUNTER — Other Ambulatory Visit: Payer: Self-pay

## 2018-07-24 DIAGNOSIS — I493 Ventricular premature depolarization: Secondary | ICD-10-CM | POA: Diagnosis not present

## 2018-07-24 DIAGNOSIS — I10 Essential (primary) hypertension: Secondary | ICD-10-CM | POA: Diagnosis not present

## 2018-07-24 DIAGNOSIS — I480 Paroxysmal atrial fibrillation: Secondary | ICD-10-CM | POA: Diagnosis not present

## 2018-07-24 DIAGNOSIS — E782 Mixed hyperlipidemia: Secondary | ICD-10-CM | POA: Diagnosis not present

## 2018-07-24 DIAGNOSIS — Z136 Encounter for screening for cardiovascular disorders: Secondary | ICD-10-CM | POA: Diagnosis not present

## 2018-07-24 DIAGNOSIS — I2510 Atherosclerotic heart disease of native coronary artery without angina pectoris: Secondary | ICD-10-CM | POA: Diagnosis not present

## 2018-07-25 LAB — COMPREHENSIVE METABOLIC PANEL
ALT: 34 IU/L (ref 0–44)
AST: 33 IU/L (ref 0–40)
Albumin/Globulin Ratio: 1.9 (ref 1.2–2.2)
Albumin: 4.8 g/dL (ref 3.8–4.8)
Alkaline Phosphatase: 82 IU/L (ref 39–117)
BUN/Creatinine Ratio: 14 (ref 10–24)
BUN: 16 mg/dL (ref 8–27)
Bilirubin Total: 0.7 mg/dL (ref 0.0–1.2)
CO2: 23 mmol/L (ref 20–29)
Calcium: 9.9 mg/dL (ref 8.6–10.2)
Chloride: 102 mmol/L (ref 96–106)
Creatinine, Ser: 1.18 mg/dL (ref 0.76–1.27)
GFR calc Af Amer: 72 mL/min/{1.73_m2} (ref >59)
GFR calc non Af Amer: 63 mL/min/{1.73_m2} (ref >59)
Globulin, Total: 2.5 g/dL (ref 1.5–4.5)
Glucose: 92 mg/dL (ref 65–99)
Potassium: 4.4 mmol/L (ref 3.5–5.2)
Sodium: 141 mmol/L (ref 134–144)
Total Protein: 7.3 g/dL (ref 6.0–8.5)

## 2018-07-25 LAB — NMR, LIPOPROFILE
Cholesterol, Total: 135 mg/dL (ref 100–199)
HDL Particle Number: 36.7 umol/L (ref >=30.5)
HDL-C: 55 mg/dL (ref >39)
LDL Particle Number: 1007 nmol/L — ABNORMAL HIGH (ref <1000)
LDL Size: 20 nm — ABNORMAL LOW (ref >20.5)
LDL-C: 61 mg/dL (ref 0–99)
LP-IR Score: 27 (ref <=45)
Small LDL Particle Number: 648 nmol/L — ABNORMAL HIGH (ref <=527)
Triglycerides: 93 mg/dL (ref 0–149)

## 2018-07-25 LAB — APOLIPOPROTEIN B: Apolipoprotein B: 81 mg/dL (ref <90)

## 2018-07-28 ENCOUNTER — Telehealth: Payer: Self-pay | Admitting: Pharmacist

## 2018-07-28 DIAGNOSIS — E782 Mixed hyperlipidemia: Secondary | ICD-10-CM

## 2018-07-28 NOTE — Telephone Encounter (Signed)
Discussed results with pt for 20 mins on the phone - he reports exercising more and has lost 10-15 lbs and is now down to 160 lbs. He does report eating more ice cream and baked goods. He is tolerating rosuvastatin 20mg  daily well but does not wish to increase his dose. Discussed adding Zetia, Nexletol, or Repatha to help target LDL particle # < 700. He would like to think about this but is leaning more towards Zetia if he does wish to start anything else. Will continue rosuvastatin 20mg  daily and pt reports he will work on diet. Will recheck NMR lipoprofile and ApoB in 6 months per pt request. Advised him to call clinic if he would like to start Zetia before then.

## 2018-08-21 ENCOUNTER — Other Ambulatory Visit: Payer: Self-pay

## 2018-08-21 MED ORDER — NITROGLYCERIN 0.4 MG SL SUBL: 0.4000 mg | SUBLINGUAL_TABLET | SUBLINGUAL | 3 refills | Status: DC | PRN

## 2018-11-07 DIAGNOSIS — Z23 Encounter for immunization: Secondary | ICD-10-CM | POA: Diagnosis not present

## 2018-11-11 ENCOUNTER — Other Ambulatory Visit: Payer: Self-pay | Admitting: Interventional Cardiology

## 2018-12-21 ENCOUNTER — Other Ambulatory Visit: Payer: Self-pay | Admitting: Interventional Cardiology

## 2019-01-10 DIAGNOSIS — M25512 Pain in left shoulder: Secondary | ICD-10-CM | POA: Diagnosis not present

## 2019-01-13 ENCOUNTER — Other Ambulatory Visit: Payer: Medicare Other

## 2019-01-13 ENCOUNTER — Other Ambulatory Visit: Payer: Self-pay

## 2019-01-13 DIAGNOSIS — E782 Mixed hyperlipidemia: Secondary | ICD-10-CM

## 2019-01-14 LAB — NMR, LIPOPROFILE
Cholesterol, Total: 121 mg/dL (ref 100–199)
HDL Particle Number: 37.9 umol/L (ref >=30.5)
HDL-C: 62 mg/dL (ref >39)
LDL Particle Number: 469 nmol/L (ref <1000)
LDL Size: 19.9 nm — ABNORMAL LOW (ref >20.5)
LDL-C (NIH Calc): 43 mg/dL (ref 0–99)
LP-IR Score: <25 (ref <=45)
Small LDL Particle Number: 339 nmol/L (ref <=527)
Triglycerides: 78 mg/dL (ref 0–149)

## 2019-01-14 LAB — APOLIPOPROTEIN B: Apolipoprotein B: 71 mg/dL (ref <90)

## 2019-01-20 ENCOUNTER — Other Ambulatory Visit: Payer: Self-pay | Admitting: Pharmacist

## 2019-01-20 DIAGNOSIS — E782 Mixed hyperlipidemia: Secondary | ICD-10-CM

## 2019-01-20 NOTE — Progress Notes (Signed)
Lipids are excellent - pt continues on rosuvastatin 20mg  daily, these labs reflect lifestyle interventions over the past 6 months. Called pt to discuss lab results - he states he's been working out more riding his bike and has improved his diet (fewer carbs and sweets). Pt has also lost 25 lbs (177 to 154 lbs). ApoB has dropped 10mg /dL, LDL has dropped from 61 to 43, and most impressively LDL particle # has dropped from 1007 to 469. Pt aware to continue with excellent lifestyle modifications. Will recheck advanced lipid panel and CMET in 6 months per pt request.

## 2019-01-21 DIAGNOSIS — F411 Generalized anxiety disorder: Secondary | ICD-10-CM | POA: Diagnosis not present

## 2019-01-21 DIAGNOSIS — Z79899 Other long term (current) drug therapy: Secondary | ICD-10-CM | POA: Diagnosis not present

## 2019-01-21 DIAGNOSIS — L84 Corns and callosities: Secondary | ICD-10-CM | POA: Diagnosis not present

## 2019-01-21 DIAGNOSIS — Z0000 Encounter for general adult medical examination without abnormal findings: Secondary | ICD-10-CM | POA: Diagnosis not present

## 2019-01-21 DIAGNOSIS — Z125 Encounter for screening for malignant neoplasm of prostate: Secondary | ICD-10-CM | POA: Diagnosis not present

## 2019-01-21 DIAGNOSIS — K649 Unspecified hemorrhoids: Secondary | ICD-10-CM | POA: Diagnosis not present

## 2019-01-21 DIAGNOSIS — I700 Atherosclerosis of aorta: Secondary | ICD-10-CM | POA: Diagnosis not present

## 2019-01-26 ENCOUNTER — Other Ambulatory Visit: Payer: Self-pay

## 2019-01-26 DIAGNOSIS — Z125 Encounter for screening for malignant neoplasm of prostate: Secondary | ICD-10-CM

## 2019-01-27 ENCOUNTER — Other Ambulatory Visit: Payer: Medicare Other

## 2019-01-28 ENCOUNTER — Telehealth: Payer: Self-pay | Admitting: Interventional Cardiology

## 2019-01-28 NOTE — Telephone Encounter (Signed)
Patient will switch to virtual visit for tomorrow.      Virtual Visit Pre-Appointment Phone Call TELEPHONE CALL NOTE  Noah [name] has been deemed a candidate for a follow-up tele-health visit to limit community exposure during the Covid-19 pandemic. I spoke with the patient via phone to ensure availability of phone/video source, confirm preferred email & phone number, and discuss instructions and expectations.  I reminded Noah [name] to be prepared with any vital sign and/or heart rhythm information that could potentially be obtained via home monitoring, at the time of his visit. I reminded Noah [name] to expect a phone call prior to his visit.  Cleon Gustin, RN 01/28/2019 1:43 PM   FULL LENGTH CONSENT FOR TELE-HEALTH VISIT   I hereby voluntarily request, consent and authorize [facility] and its employed or contracted physicians, physician assistants, nurse practitioners or other licensed health care professionals (the Practitioner), to provide me with telemedicine health care services (the "Services") as deemed necessary by the treating Practitioner. I acknowledge and consent to receive the Services by the Practitioner via telemedicine. I understand that the telemedicine visit will involve communicating with the Practitioner through live audiovisual communication technology and the disclosure of certain medical information by electronic transmission. I acknowledge that I have been given the opportunity to request an in-person assessment or other available alternative prior to the telemedicine visit and am voluntarily participating in the telemedicine visit.  I understand that I have the right to withhold or withdraw my consent to the use of telemedicine in the course of my care at any time, without affecting my right to future care or treatment, and that the Practitioner or I may terminate the telemedicine visit at any time. I understand that I have the right to  inspect all information obtained and/or recorded in the course of the telemedicine visit and may receive copies of available information for a reasonable fee.  I understand that some of the potential risks of receiving the Services via telemedicine include:  Marland Kitchen Delay or interruption in medical evaluation due to technological equipment failure or disruption; . Information transmitted may not be sufficient (e.g. poor resolution of images) to allow for appropriate medical decision making by the Practitioner; and/or  . In rare instances, security protocols could fail, causing a breach of personal health information.  Furthermore, I acknowledge that it is my responsibility to provide information about my medical history, conditions and care that is complete and accurate to the best of my ability. I acknowledge that Practitioner's advice, recommendations, and/or decision may be based on factors not within their control, such as incomplete or inaccurate data provided by me or distortions of diagnostic images or specimens that may result from electronic transmissions. I understand that the practice of medicine is not an exact science and that Practitioner makes no warranties or guarantees regarding treatment outcomes. I acknowledge that I will receive a copy of this consent concurrently upon execution via email to the email address I last provided but may also request a printed copy by calling the office of [place].    I understand that my insurance will be billed for this visit.   I have read or had this consent read to me. . I understand the contents of this consent, which adequately explains the benefits and risks of the Services being provided via telemedicine.  . I have been provided ample opportunity to ask questions regarding this consent and the Services and have had my questions answered to  my satisfaction. . I give my informed consent for the services to be provided through the use of  telemedicine in my medical care  By participating in this telemedicine visit I agree to the above.

## 2019-01-28 NOTE — Progress Notes (Signed)
Virtual Visit via Video Note   This visit type was conducted due to national recommendations for restrictions regarding the COVID-19 Pandemic (e.g. social distancing) in an effort to limit this patient's exposure and mitigate transmission in our community.  Due to his co-morbid illnesses, this patient is at least at moderate risk for complications without adequate follow up.  This format is felt to be most appropriate for this patient at this time.  All issues noted in this document were discussed and addressed.  A limited physical exam was performed with this format.  Please refer to the patient's chart for his consent to telehealth for [facility]   Date:  01/29/2019   ID:  Noah [name], DOB [birth year], MRN [id number]  Patient Location: Home Provider Location: Office  PCP:  Lawerance Cruel, MD  Cardiologist:  Larae Grooms, MD  Electrophysiologist:  None   Evaluation Performed:  Follow-Up Visit  Chief Complaint:  CAD  History of Present Illness:    Noah [name] is a 68 y.o. male with with coronary artery disease status post CABG in 2007, remote paroxysmal atrial fibrillation (noted many years ago), hyperlipidemia (intolerant of some statins).In 2/19, He has had some palpitations. 48-hour Holter monitor demonstrated normal rhythm, PVCs and PACs. There wasno atrial fibrillation noted.  He was evaluated for palpitations in the office in 9/19. He was under stress.  He was taking an extra half of metoprolol with less benefit than usual.   Echocardiogram 06/10/2017 Mild LVH, EF 65-70, normal wall motion, grade 1 diastolic dysfunction, aortic sclerosis without stenosis, trivial AI, mild LAE  48-hour Holter 04/01/2017 Normal sinus rhythm, PACs and PVCs correlate with symptoms; no significant arrhythmias  Nuclear stress test 10/23/2016 EF 67, no ischemia, low risk  Carotid US 04/27/2015 Bilateral ICA 1-39; follow-up as needed  Since the last visit,  he has had some flucuating heart rates while exercising.  He tolerates riding the bike well.   Since the last visit, he increased his bike duration and decreased carbs.  His lipids were improved in 12/2018.  Denies : Chest pain. Dizziness. Leg edema. Nitroglycerin use. Orthopnea. Palpitations. Paroxysmal nocturnal dyspnea. Shortness of breath. Syncope.   His BP and HR are well controlled at home.  HE goes out in the cold weather and can feel some tightness in his breathing.   The patient does not have symptoms concerning for COVID-19 infection (fever, chills, cough, or new shortness of breath).    Past Medical History:  Diagnosis Date  . Anxiety   . Arthritis   . Coronary artery disease 07/13/2005   CABG  . DDD (degenerative disc disease)   . DJD (degenerative joint disease)   . Elevated liver enzymes   . Hemangioma of liver   . Hyperlipemia   . PAC (premature atrial contraction)   . Paroxysmal atrial fibrillation (HCC)   . Pneumonia   . PVC (premature ventricular contraction)    Past Surgical History:  Procedure Laterality Date  . CORONARY ARTERY BYPASS GRAFT  07/13/2005  . INGUINAL HERNIA REPAIR Right 1978, 2000   x 2  . KNEE SURGERY Right      Current Meds  Medication Sig  . aspirin EC 81 MG tablet Take 1 tablet (81 mg total) by mouth daily.  . Cholecalciferol (VITAMIN D) 2000 UNITS tablet Take 2,000 Units by mouth daily.  . Flaxseed, Linseed, POWD Take 2 scoop by mouth as directed.  Marland Kitchen ibuprofen (ADVIL,MOTRIN) 200 MG tablet Take 200 mg by mouth every  6 (six) hours as needed.  Marland Kitchen LORazepam (ATIVAN) 0.5 MG tablet Take 1 tablet by mouth at bedtime as needed for sleep. To help with sleep  . metoprolol succinate (TOPROL-XL) 25 MG 24 hr tablet Take 1 tablet (25 mg total) by mouth 2 (two) times daily. Please keep upcoming appt in December before anymore refills. Thank you  . metoprolol tartrate (LOPRESSOR) 25 MG tablet Take 1/2 -1 tab as needed  . Multiple Vitamin (MULTIVITAMIN  WITH MINERALS) TABS Take 1 tablet by mouth daily. Centrum silver  . nitroGLYCERIN (NITROSTAT) 0.4 MG SL tablet Place 1 tablet (0.4 mg total) under the tongue every 5 (five) minutes as needed for chest pain (3 DOSES MAX).  Marland Kitchen pramoxine-hydrocortisone (ANALPRAM HC) cream Apply 1 application topically at bedtime as needed (for skin).   . Psyllium (METAMUCIL PO) 2 tablespoons by mouth as needed, for consitpation  . rosuvastatin (CRESTOR) 20 MG tablet TAKE 1 TABLET DAILY        *SANDOZ*  . Ubiquinol 100 MG CAPS Take 100 mg by mouth daily.     Allergies:   Cephalexin, Levofloxacin, Ciprofloxacin, Doxycycline, Lipitor [atorvastatin], and Penicillins   Social History   Tobacco Use  . Smoking status: Former Smoker    Types: Cigarettes    Quit date: 02/19/1974    Years since quitting: 44.9  . Smokeless tobacco: Never Used  . Tobacco comment: stopped 32 yrs ago  Substance Use Topics  . Alcohol use: Yes    Comment: rare  . Drug use: No     Family Hx: The patient's family history includes Atrial fibrillation (age of onset: 37) in his mother; Colon polyps in his mother; Coronary artery disease in his father; Heart attack in his father, maternal grandfather, paternal grandfather, and paternal grandmother; Prostate cancer in his maternal uncle; Seizures in his brother.  ROS:   Please see the history of present illness.    Recent exposure to grand-daughter who had a friend with COVD- he is asymptomatic All other systems reviewed and are negative.   Prior CV studies:   The following studies were reviewed today:  Lipids reviewed  Labs/Other Tests and Data Reviewed:    EKG:  An ECG dated 10/2018 was personally reviewed today and demonstrated:  NSR, no ST segment changes  Recent Labs: 07/24/2018: ALT 34; BUN 16; Creatinine, Ser 1.18; Potassium 4.4; Sodium 141   Recent Lipid Panel Lab Results  Component Value Date/Time   CHOL 116 01/23/2017 10:30 AM   CHOL 121 02/09/2016 12:06 PM   CHOL 124  07/28/2014 09:03 AM   TRIG 108 01/23/2017 10:30 AM   TRIG 112 07/28/2014 09:03 AM   HDL 46 01/23/2017 10:30 AM   HDL 46 07/28/2014 09:03 AM   CHOLHDL 2.5 02/09/2016 12:06 PM   LDLCALC 53 02/09/2016 12:06 PM   LDLCALC 56 07/28/2014 09:03 AM    Wt Readings from Last 3 Encounters:  01/29/19 153 lb (69.4 kg)  01/22/18 170 lb 12.8 oz (77.5 kg)  10/22/17 172 lb 3.2 oz (78.1 kg)     Objective:    Vital Signs:  BP 109/68   Pulse 62   Wt 153 lb (69.4 kg)   BMI 23.26 kg/m    VITAL SIGNS:  reviewed GEN:  no acute distress RESPIRATORY:  no shortness of breath PSYCH:  normal affect exam limited by video format  ASSESSMENT & PLAN:    1. CAD: No angina. Continue aggressive secondary prevention.   2. Hyperlipidemia: The current medical regimen is effective;  continue present plan and medications. Lipids well controlled. Refill Crestor 90 days.  3. PVCs: controlled.  4. PAF: In NSR by his check.  5. HTN: The current medical regimen is effective;  continue present plan and medications.  COVID-19 Education: The signs and symptoms of COVID-19 were discussed with the patient and how to seek care for testing (follow up with PCP or arrange E-visit).  The importance of social distancing was discussed today.  Time:   Today, I have spent 20 minutes with the patient with telehealth technology discussing the above problems.     Medication Adjustments/Labs and Tests Ordered: Current medicines are reviewed at length with the patient today.  Concerns regarding medicines are outlined above.   Tests Ordered: No orders of the defined types were placed in this encounter.   Medication Changes: No orders of the defined types were placed in this encounter.   Follow Up:  In Person in 6 month(s)  Signed, Larae Grooms, MD  01/29/2019 3:57 PM    [place]

## 2019-01-29 ENCOUNTER — Other Ambulatory Visit: Payer: Self-pay

## 2019-01-29 ENCOUNTER — Encounter: Payer: Self-pay | Admitting: Interventional Cardiology

## 2019-01-29 ENCOUNTER — Telehealth (INDEPENDENT_AMBULATORY_CARE_PROVIDER_SITE_OTHER): Payer: Medicare Other | Admitting: Interventional Cardiology

## 2019-01-29 VITALS — BP 109/68 | HR 62 | Wt 153.0 lb

## 2019-01-29 DIAGNOSIS — I10 Essential (primary) hypertension: Secondary | ICD-10-CM | POA: Diagnosis not present

## 2019-01-29 DIAGNOSIS — I493 Ventricular premature depolarization: Secondary | ICD-10-CM

## 2019-01-29 DIAGNOSIS — I2510 Atherosclerotic heart disease of native coronary artery without angina pectoris: Secondary | ICD-10-CM | POA: Diagnosis not present

## 2019-01-29 DIAGNOSIS — I480 Paroxysmal atrial fibrillation: Secondary | ICD-10-CM | POA: Diagnosis not present

## 2019-01-29 DIAGNOSIS — E782 Mixed hyperlipidemia: Secondary | ICD-10-CM

## 2019-01-29 MED ORDER — ROSUVASTATIN CALCIUM 20 MG PO TABS: ORAL_TABLET | ORAL | 3 refills | Status: DC

## 2019-01-29 NOTE — Patient Instructions (Signed)
Medication Instructions:  Your physician recommends that you continue on your current medications as directed. Please refer to the Current Medication list given to you today.  *If you need a refill on your cardiac medications before your next appointment, please call your pharmacy*  Lab Work: None ordered  If you have labs (blood work) drawn today and your tests are completely normal, you will receive your results only by: . MyChart Message (if you have MyChart) OR . A paper copy in the mail If you have any lab test that is abnormal or we need to change your treatment, we will call you to review the results.  Testing/Procedures: None ordered  Follow-Up: At [facility], you and your health needs are our priority.  As part of our continuing mission to provide you with exceptional heart care, we have created designated Provider Care Teams.  These Care Teams include your primary Cardiologist (physician) and Advanced Practice Providers (APPs -  Physician Assistants and Nurse Practitioners) who all work together to provide you with the care you need, when you need it.  Your next appointment:   6 month(s)  The format for your next appointment:   In Person  Provider:   You may see Jayadeep Varanasi, MD or one of the following Advanced Practice Providers on your designated Care Team:    Dayna Dunn, PA-C  Michele Lenze, PA-C   Other Instructions   

## 2019-01-29 NOTE — Telephone Encounter (Signed)
Did not need this encounter °

## 2019-02-06 NOTE — Telephone Encounter (Signed)
Called the pts pharmacy [facility],  and spoke with Representative Zhade who states they see the order but it will not be able to be received and filled until 02/13/19. Asked her if she could see the order for rosuvastatin 20 mg -take 1 tablet daily *SANDOZ* as the manufacturer, and she stated she cannot see that entire order, until it is ready to be processed and filled on the date of 12/25. Rep said that on 12/25, they will be able to see the complete order with those exact instructions of manufacturer use of Sandoz, when it is ready to be filled on 12/25.  Per Bunnie Pion Rep, they did receive the order from our office sent on 12/10, but like she said, this will not be filled as ordered, until 12/25.  Informed the pt of this via mychart message sent, as well as will route this message to Dr. Hassell Done RN, for further follow-up as needed.

## 2019-03-25 DIAGNOSIS — R6883 Chills (without fever): Secondary | ICD-10-CM | POA: Diagnosis not present

## 2019-03-26 DIAGNOSIS — R6883 Chills (without fever): Secondary | ICD-10-CM | POA: Diagnosis not present

## 2019-03-26 DIAGNOSIS — Z1152 Encounter for screening for COVID-19: Secondary | ICD-10-CM | POA: Diagnosis not present

## 2019-03-26 DIAGNOSIS — Z20822 Contact with and (suspected) exposure to covid-19: Secondary | ICD-10-CM | POA: Diagnosis not present

## 2019-04-08 DIAGNOSIS — R42 Dizziness and giddiness: Secondary | ICD-10-CM | POA: Diagnosis not present

## 2019-04-13 ENCOUNTER — Other Ambulatory Visit: Payer: Self-pay | Admitting: Interventional Cardiology

## 2019-04-13 ENCOUNTER — Ambulatory Visit: Payer: Medicare Other

## 2019-04-16 DIAGNOSIS — E782 Mixed hyperlipidemia: Secondary | ICD-10-CM | POA: Diagnosis not present

## 2019-04-16 DIAGNOSIS — M179 Osteoarthritis of knee, unspecified: Secondary | ICD-10-CM | POA: Diagnosis not present

## 2019-04-16 DIAGNOSIS — I2510 Atherosclerotic heart disease of native coronary artery without angina pectoris: Secondary | ICD-10-CM | POA: Diagnosis not present

## 2019-04-24 DIAGNOSIS — M25511 Pain in right shoulder: Secondary | ICD-10-CM | POA: Diagnosis not present

## 2019-05-05 DIAGNOSIS — M25511 Pain in right shoulder: Secondary | ICD-10-CM | POA: Diagnosis not present

## 2019-05-05 DIAGNOSIS — M25512 Pain in left shoulder: Secondary | ICD-10-CM | POA: Diagnosis not present

## 2019-05-11 DIAGNOSIS — M25511 Pain in right shoulder: Secondary | ICD-10-CM | POA: Diagnosis not present

## 2019-05-11 DIAGNOSIS — M25512 Pain in left shoulder: Secondary | ICD-10-CM | POA: Diagnosis not present

## 2019-05-13 DIAGNOSIS — D485 Neoplasm of uncertain behavior of skin: Secondary | ICD-10-CM | POA: Diagnosis not present

## 2019-05-13 DIAGNOSIS — L821 Other seborrheic keratosis: Secondary | ICD-10-CM | POA: Diagnosis not present

## 2019-05-13 DIAGNOSIS — L820 Inflamed seborrheic keratosis: Secondary | ICD-10-CM | POA: Diagnosis not present

## 2019-05-13 DIAGNOSIS — L84 Corns and callosities: Secondary | ICD-10-CM | POA: Diagnosis not present

## 2019-05-18 DIAGNOSIS — M25511 Pain in right shoulder: Secondary | ICD-10-CM | POA: Diagnosis not present

## 2019-05-18 DIAGNOSIS — M25512 Pain in left shoulder: Secondary | ICD-10-CM | POA: Diagnosis not present

## 2019-05-25 DIAGNOSIS — H35371 Puckering of macula, right eye: Secondary | ICD-10-CM | POA: Diagnosis not present

## 2019-05-27 DIAGNOSIS — M25512 Pain in left shoulder: Secondary | ICD-10-CM | POA: Diagnosis not present

## 2019-05-27 DIAGNOSIS — M25511 Pain in right shoulder: Secondary | ICD-10-CM | POA: Diagnosis not present

## 2019-05-29 DIAGNOSIS — H9202 Otalgia, left ear: Secondary | ICD-10-CM | POA: Diagnosis not present

## 2019-06-10 DIAGNOSIS — M25511 Pain in right shoulder: Secondary | ICD-10-CM | POA: Diagnosis not present

## 2019-06-10 DIAGNOSIS — M25512 Pain in left shoulder: Secondary | ICD-10-CM | POA: Diagnosis not present

## 2019-06-16 DIAGNOSIS — M25512 Pain in left shoulder: Secondary | ICD-10-CM | POA: Diagnosis not present

## 2019-06-16 DIAGNOSIS — M25511 Pain in right shoulder: Secondary | ICD-10-CM | POA: Diagnosis not present

## 2019-06-22 DIAGNOSIS — R5383 Other fatigue: Secondary | ICD-10-CM | POA: Diagnosis not present

## 2019-06-22 DIAGNOSIS — R197 Diarrhea, unspecified: Secondary | ICD-10-CM | POA: Diagnosis not present

## 2019-06-22 DIAGNOSIS — E782 Mixed hyperlipidemia: Secondary | ICD-10-CM | POA: Diagnosis not present

## 2019-06-22 DIAGNOSIS — Z125 Encounter for screening for malignant neoplasm of prostate: Secondary | ICD-10-CM | POA: Diagnosis not present

## 2019-06-22 DIAGNOSIS — R1013 Epigastric pain: Secondary | ICD-10-CM | POA: Diagnosis not present

## 2019-06-23 DIAGNOSIS — Z125 Encounter for screening for malignant neoplasm of prostate: Secondary | ICD-10-CM | POA: Diagnosis not present

## 2019-06-23 DIAGNOSIS — E782 Mixed hyperlipidemia: Secondary | ICD-10-CM | POA: Diagnosis not present

## 2019-06-23 DIAGNOSIS — R5383 Other fatigue: Secondary | ICD-10-CM | POA: Diagnosis not present

## 2019-06-23 DIAGNOSIS — R142 Eructation: Secondary | ICD-10-CM | POA: Diagnosis not present

## 2019-06-23 DIAGNOSIS — R197 Diarrhea, unspecified: Secondary | ICD-10-CM | POA: Diagnosis not present

## 2019-06-23 DIAGNOSIS — M25512 Pain in left shoulder: Secondary | ICD-10-CM | POA: Diagnosis not present

## 2019-06-23 DIAGNOSIS — R1013 Epigastric pain: Secondary | ICD-10-CM | POA: Diagnosis not present

## 2019-06-23 DIAGNOSIS — M25511 Pain in right shoulder: Secondary | ICD-10-CM | POA: Diagnosis not present

## 2019-06-25 ENCOUNTER — Telehealth: Payer: Self-pay | Admitting: Pharmacist

## 2019-06-25 NOTE — Telephone Encounter (Signed)
Patient called stating he labs done recently- does not need PSA or CMP done with labs in June

## 2019-07-02 DIAGNOSIS — M25511 Pain in right shoulder: Secondary | ICD-10-CM | POA: Diagnosis not present

## 2019-07-02 DIAGNOSIS — M25512 Pain in left shoulder: Secondary | ICD-10-CM | POA: Diagnosis not present

## 2019-07-29 ENCOUNTER — Ambulatory Visit: Payer: Medicare Other | Admitting: Gastroenterology

## 2019-07-29 ENCOUNTER — Telehealth: Payer: Self-pay | Admitting: Gastroenterology

## 2019-07-29 NOTE — Telephone Encounter (Signed)
Yes, he was scheduled to see me this morning but canceled.  I am happy if he wants to reschedule for my next available new GI office visit.

## 2019-07-29 NOTE — Telephone Encounter (Signed)
Good morning Dr. Ardis Hughs, this pt would like to continue his GI care with you because his PCP has strongly recommended to see you. He used to see Dr. Philip Aspen. He has been referred for gerd issues and he was scheduled for an appointment with you this morning but had to cancel due to his 71 year old mother having an emergency. Please advise if it is ok to schedule patient with you. Thank you

## 2019-07-29 NOTE — Telephone Encounter (Signed)
Consult scheduled on 7/28 at 10:10am.

## 2019-08-13 ENCOUNTER — Other Ambulatory Visit: Payer: Self-pay

## 2019-08-13 ENCOUNTER — Other Ambulatory Visit: Payer: Medicare Other | Admitting: *Deleted

## 2019-08-13 DIAGNOSIS — E782 Mixed hyperlipidemia: Secondary | ICD-10-CM

## 2019-08-13 NOTE — Progress Notes (Signed)
Cardiology Office Note   Date:  08/14/2019   ID:  Noah [name], DOB 09/06/1950, MRN [id number]  PCP:  Lawerance Cruel, MD    No chief complaint on file.  CAD  Wt Readings from Last 3 Encounters:  08/14/19 164 lb 3.2 oz (74.5 kg)  01/29/19 153 lb (69.4 kg)  01/22/18 170 lb 12.8 oz (77.5 kg)       History of Present Illness: Noah [name] is a 69 y.o. male   with coronary artery disease status post CABG in 2007, remote paroxysmal atrial fibrillation (noted many years ago), hyperlipidemia (intolerant of some statins).In 2/19,He has had some palpitations. 48-hour Holter monitor demonstrated normal rhythm, PVCs and PACs. There wasno atrial fibrillation noted.  He was evaluated for palpitations in the office in 9/19. He was under stress. He was taking an extra half of metoprolol with less benefit than usual.   Echocardiogram 06/10/2017 Mild LVH, EF 65-70, normal wall motion, grade 1 diastolic dysfunction, aortic sclerosis without stenosis, trivial AI, mild LAE  48-hour Holter 04/01/2017 Normal sinus rhythm, PACs and PVCs correlate with symptoms; no significant arrhythmias  Nuclear stress test 10/23/2016 EF 67, no ischemia, low risk  Carotid US 04/27/2015 Bilateral ICA 1-39; follow-up as needed  In the past, he has had some flucuating heart rates while exercising. He tolerates riding the bike well. In 2020, he increased his bike duration and decreased carbs.  His lipids were improved in 12/2018.  Since last visit, he has been eating healthy.  He has shoulder pain due to orthopedic issues. He has been taking more ibuprofen.  Denies : Chest pain. Dizziness. Leg edema. Nitroglycerin use. Orthopnea. Palpitations. Paroxysmal nocturnal dyspnea. Shortness of breath. Syncope.   [place] vaccine.  Past Medical History:  Diagnosis Date  . Anxiety   . Arthritis   . Coronary artery disease 07/13/2005   CABG  . DDD (degenerative disc disease)     . DJD (degenerative joint disease)   . Elevated liver enzymes   . GERD (gastroesophageal reflux disease)   . Hemangioma of liver   . Hyperlipemia   . PAC (premature atrial contraction)   . Paroxysmal atrial fibrillation (HCC)   . Pneumonia   . PVC (premature ventricular contraction)     Past Surgical History:  Procedure Laterality Date  . CORONARY ARTERY BYPASS GRAFT  07/13/2005  . INGUINAL HERNIA REPAIR Right 1978, 2000   x 2  . KNEE SURGERY Right   . SEPTOPLASTY       Current Outpatient Medications  Medication Sig Dispense Refill  . aspirin EC 81 MG tablet Take 1 tablet (81 mg total) by mouth daily.    . Cholecalciferol (VITAMIN D) 2000 UNITS tablet Take 2,000 Units by mouth daily.    . Flaxseed, Linseed, POWD Take 2 scoop by mouth as directed.    Marland Kitchen ibuprofen (ADVIL,MOTRIN) 200 MG tablet Take 200 mg by mouth every 6 (six) hours as needed.    Marland Kitchen LORazepam (ATIVAN) 0.5 MG tablet Take 1 tablet by mouth at bedtime as needed for sleep. To help with sleep    . metoprolol succinate (TOPROL-XL) 25 MG 24 hr tablet TAKE 1 TABLET TWICE A DAY  *LANNETT MFR* 180 tablet 2  . metoprolol tartrate (LOPRESSOR) 25 MG tablet Take 1/2 -1 tab as needed 180 tablet 3  . Multiple Vitamin (MULTIVITAMIN WITH MINERALS) TABS Take 1 tablet by mouth daily. Centrum silver    . nitroGLYCERIN (NITROSTAT) 0.4 MG SL tablet Place  1 tablet (0.4 mg total) under the tongue every 5 (five) minutes as needed for chest pain (3 DOSES MAX). 25 tablet 3  . pramoxine-hydrocortisone (ANALPRAM HC) cream Apply 1 application topically at bedtime as needed (for skin).     . Psyllium (METAMUCIL PO) 2 tablespoons by mouth as needed, for consitpation    . rosuvastatin (CRESTOR) 20 MG tablet TAKE 1 TABLET DAILY        *SANDOZ* 90 tablet 3  . Ubiquinol 100 MG CAPS Take 100 mg by mouth daily.     No current facility-administered medications for this visit.    Allergies:   Cephalexin, Levofloxacin, Ciprofloxacin, Doxycycline, Lipitor  [atorvastatin], and Penicillins    Social History:  The patient  reports that he quit smoking about 45 years ago. His smoking use included cigarettes. He has never used smokeless tobacco. He reports current alcohol use. He reports that he does not use drugs.   Family History:  The patient's family history includes Atrial fibrillation (age of onset: 82) in his mother; Colon polyps in his mother; Coronary artery disease in his father; Heart attack in his father, maternal grandfather, paternal grandfather, and paternal grandmother; Prostate cancer in his maternal uncle; Seizures in his brother.    ROS:  Please see the history of present illness.   Otherwise, review of systems are positive for shoulder pain.   All other systems are reviewed and negative.    PHYSICAL EXAM: VS:  BP 120/68   Pulse 72   Ht 5\' 8"  (1.727 m)   Wt 164 lb 3.2 oz (74.5 kg)   SpO2 96%   BMI 24.97 kg/m  , BMI Body mass index is 24.97 kg/m. GEN: Well nourished, well developed, in no acute distress  HEENT: normal  Neck: no JVD, carotid bruits, or masses Cardiac: RRR; no murmurs, rubs, or gallops,no edema  Respiratory:  clear to auscultation bilaterally, normal work of breathing GI: soft, nontender, nondistended, + BS MS: no deformity or atrophy  Skin: warm and dry, no rash Neuro:  Strength and sensation are intact Psych: euthymic mood, full affect   EKG:   The ekg ordered today demonstrates normal ECG   Recent Labs: 08/13/2019: ALT 27; BUN 13; Creatinine, Ser 1.12; Potassium 4.3; Sodium 141   Lipid Panel    Component Value Date/Time   CHOL 116 01/23/2017 1030   CHOL 121 02/09/2016 1206   CHOL 124 07/28/2014 0903   TRIG 108 01/23/2017 1030   TRIG 112 07/28/2014 0903   HDL 46 01/23/2017 1030   HDL 46 07/28/2014 0903   CHOLHDL 2.5 02/09/2016 1206   LDLCALC 53 02/09/2016 1206   LDLCALC 56 07/28/2014 0903     Other studies Reviewed: Additional studies/ records that were reviewed today with results  demonstrating: labs reviewed from June 2021 .   ASSESSMENT AND PLAN:  1. CAD: Continue aggressive secondary prevention.  No angina 2. Hyperlipidemia: Whole food, plant based diet.  Will call him with the particle numbers when available.  Avoid daily NSAIDs due to CAD. 3. PVCs: occasional skipped beats 4. PAF:  No sustained palpitations 5. HTN: The current medical regimen is effective;  continue present plan and medications.    Current medicines are reviewed at length with the patient today.  The patient concerns regarding his medicines were addressed.  The following changes have been made:  No change  Labs/ tests ordered today include:  No orders of the defined types were placed in this encounter.   Recommend 150 minutes/week  of aerobic exercise Low fat, low carb, high fiber diet recommended  Disposition:   FU in 6 months   Signed, Larae Grooms, MD  08/14/2019 3:26 PM    [facility] [place], [place], [place]  [address] Phone: [phone]; Fax: [phone]

## 2019-08-14 ENCOUNTER — Encounter: Payer: Self-pay | Admitting: Interventional Cardiology

## 2019-08-14 ENCOUNTER — Ambulatory Visit (INDEPENDENT_AMBULATORY_CARE_PROVIDER_SITE_OTHER): Payer: Medicare Other | Admitting: Interventional Cardiology

## 2019-08-14 VITALS — BP 120/68 | HR 72 | Ht 68.0 in | Wt 164.2 lb

## 2019-08-14 DIAGNOSIS — E782 Mixed hyperlipidemia: Secondary | ICD-10-CM | POA: Diagnosis not present

## 2019-08-14 DIAGNOSIS — I480 Paroxysmal atrial fibrillation: Secondary | ICD-10-CM | POA: Diagnosis not present

## 2019-08-14 DIAGNOSIS — I493 Ventricular premature depolarization: Secondary | ICD-10-CM

## 2019-08-14 DIAGNOSIS — I2510 Atherosclerotic heart disease of native coronary artery without angina pectoris: Secondary | ICD-10-CM | POA: Diagnosis not present

## 2019-08-14 DIAGNOSIS — I10 Essential (primary) hypertension: Secondary | ICD-10-CM

## 2019-08-14 LAB — COMPREHENSIVE METABOLIC PANEL
ALT: 27 IU/L (ref 0–44)
AST: 27 IU/L (ref 0–40)
Albumin/Globulin Ratio: 1.5 (ref 1.2–2.2)
Albumin: 4.4 g/dL (ref 3.8–4.8)
Alkaline Phosphatase: 99 IU/L (ref 48–121)
BUN/Creatinine Ratio: 12 (ref 10–24)
BUN: 13 mg/dL (ref 8–27)
Bilirubin Total: 0.8 mg/dL (ref 0.0–1.2)
CO2: 24 mmol/L (ref 20–29)
Calcium: 9.5 mg/dL (ref 8.6–10.2)
Chloride: 101 mmol/L (ref 96–106)
Creatinine, Ser: 1.12 mg/dL (ref 0.76–1.27)
GFR calc Af Amer: 77 mL/min/{1.73_m2} (ref >59)
GFR calc non Af Amer: 66 mL/min/{1.73_m2} (ref >59)
Globulin, Total: 2.9 g/dL (ref 1.5–4.5)
Glucose: 84 mg/dL (ref 65–99)
Potassium: 4.3 mmol/L (ref 3.5–5.2)
Sodium: 141 mmol/L (ref 134–144)
Total Protein: 7.3 g/dL (ref 6.0–8.5)

## 2019-08-14 LAB — NMR, LIPOPROFILE
Cholesterol, Total: 125 mg/dL (ref 100–199)
HDL Particle Number: 34.1 umol/L (ref >=30.5)
HDL-C: 50 mg/dL (ref >39)
LDL Particle Number: 871 nmol/L (ref <1000)
LDL Size: 19.9 nm — ABNORMAL LOW (ref >20.5)
LDL-C (NIH Calc): 56 mg/dL (ref 0–99)
LP-IR Score: 34 (ref <=45)
Small LDL Particle Number: 559 nmol/L — ABNORMAL HIGH (ref <=527)
Triglycerides: 104 mg/dL (ref 0–149)

## 2019-08-14 LAB — APOLIPOPROTEIN B: Apolipoprotein B: 79 mg/dL (ref <90)

## 2019-08-14 NOTE — Patient Instructions (Signed)

## 2019-08-21 DIAGNOSIS — M7581 Other shoulder lesions, right shoulder: Secondary | ICD-10-CM | POA: Diagnosis not present

## 2019-09-02 DIAGNOSIS — R9431 Abnormal electrocardiogram [ECG] [EKG]: Secondary | ICD-10-CM | POA: Diagnosis not present

## 2019-09-02 DIAGNOSIS — R002 Palpitations: Secondary | ICD-10-CM | POA: Diagnosis not present

## 2019-09-02 DIAGNOSIS — R531 Weakness: Secondary | ICD-10-CM | POA: Diagnosis not present

## 2019-09-02 DIAGNOSIS — I493 Ventricular premature depolarization: Secondary | ICD-10-CM | POA: Diagnosis not present

## 2019-09-02 DIAGNOSIS — Z7982 Long term (current) use of aspirin: Secondary | ICD-10-CM | POA: Diagnosis not present

## 2019-09-02 DIAGNOSIS — Z888 Allergy status to other drugs, medicaments and biological substances status: Secondary | ICD-10-CM | POA: Diagnosis not present

## 2019-09-02 DIAGNOSIS — Z881 Allergy status to other antibiotic agents status: Secondary | ICD-10-CM | POA: Diagnosis not present

## 2019-09-02 DIAGNOSIS — R55 Syncope and collapse: Secondary | ICD-10-CM | POA: Diagnosis not present

## 2019-09-02 DIAGNOSIS — Z880 Allergy status to penicillin: Secondary | ICD-10-CM | POA: Diagnosis not present

## 2019-09-02 DIAGNOSIS — G319 Degenerative disease of nervous system, unspecified: Secondary | ICD-10-CM | POA: Diagnosis not present

## 2019-09-02 DIAGNOSIS — Z951 Presence of aortocoronary bypass graft: Secondary | ICD-10-CM | POA: Diagnosis not present

## 2019-09-02 DIAGNOSIS — R42 Dizziness and giddiness: Secondary | ICD-10-CM | POA: Diagnosis not present

## 2019-09-02 DIAGNOSIS — Z79899 Other long term (current) drug therapy: Secondary | ICD-10-CM | POA: Diagnosis not present

## 2019-09-03 DIAGNOSIS — R2689 Other abnormalities of gait and mobility: Secondary | ICD-10-CM

## 2019-09-03 DIAGNOSIS — R42 Dizziness and giddiness: Secondary | ICD-10-CM

## 2019-09-06 ENCOUNTER — Other Ambulatory Visit: Payer: Self-pay | Admitting: Interventional Cardiology

## 2019-09-09 DIAGNOSIS — M25511 Pain in right shoulder: Secondary | ICD-10-CM | POA: Diagnosis not present

## 2019-09-09 DIAGNOSIS — M25512 Pain in left shoulder: Secondary | ICD-10-CM | POA: Diagnosis not present

## 2019-09-13 DIAGNOSIS — R42 Dizziness and giddiness: Secondary | ICD-10-CM | POA: Diagnosis not present

## 2019-09-15 ENCOUNTER — Other Ambulatory Visit: Payer: Self-pay

## 2019-09-15 ENCOUNTER — Ambulatory Visit (INDEPENDENT_AMBULATORY_CARE_PROVIDER_SITE_OTHER): Payer: Medicare Other | Admitting: Interventional Cardiology

## 2019-09-15 ENCOUNTER — Encounter: Payer: Self-pay | Admitting: Interventional Cardiology

## 2019-09-15 VITALS — BP 116/68 | HR 74 | Ht 68.0 in | Wt 164.6 lb

## 2019-09-15 DIAGNOSIS — I2510 Atherosclerotic heart disease of native coronary artery without angina pectoris: Secondary | ICD-10-CM

## 2019-09-15 DIAGNOSIS — I10 Essential (primary) hypertension: Secondary | ICD-10-CM

## 2019-09-15 DIAGNOSIS — I493 Ventricular premature depolarization: Secondary | ICD-10-CM

## 2019-09-15 DIAGNOSIS — R7303 Prediabetes: Secondary | ICD-10-CM | POA: Diagnosis not present

## 2019-09-15 DIAGNOSIS — R42 Dizziness and giddiness: Secondary | ICD-10-CM | POA: Diagnosis not present

## 2019-09-15 DIAGNOSIS — I480 Paroxysmal atrial fibrillation: Secondary | ICD-10-CM

## 2019-09-15 DIAGNOSIS — Z125 Encounter for screening for malignant neoplasm of prostate: Secondary | ICD-10-CM

## 2019-09-15 DIAGNOSIS — R350 Frequency of micturition: Secondary | ICD-10-CM

## 2019-09-15 DIAGNOSIS — E782 Mixed hyperlipidemia: Secondary | ICD-10-CM

## 2019-09-15 NOTE — Progress Notes (Signed)
Cardiology Office Note   Date:  09/15/2019   ID:  Noah [name], DOB 03/19/1950, MRN [id number]  PCP:  Lawerance Cruel, MD    No chief complaint on file.  CAD  Wt Readings from Last 3 Encounters:  09/15/19 164 lb 9.6 oz (74.7 kg)  08/14/19 164 lb 3.2 oz (74.5 kg)  01/29/19 153 lb (69.4 kg)       History of Present Illness: Noah [name] is a 69 y.o. male  with coronary artery disease status post CABG in 2007, remote paroxysmal atrial fibrillation (noted many years ago), hyperlipidemia (intolerant of some statins).In 2/19,He has had some palpitations. 48-hour Holter monitor demonstrated normal rhythm, PVCs and PACs. There wasno atrial fibrillation noted.  He was evaluated for palpitations in the office in 9/19. He was under stress. He was taking an extra half of metoprolol with less benefit than usual.   Echocardiogram 06/10/2017 Mild LVH, EF 65-70, normal wall motion, grade 1 diastolic dysfunction, aortic sclerosis without stenosis, trivial AI, mild LAE  48-hour Holter 04/01/2017 Normal sinus rhythm, PACs and PVCs correlate with symptoms; no significant arrhythmias  Nuclear stress test 10/23/2016 EF 67, no ischemia, low risk  Carotid US 04/27/2015 Bilateral ICA 1-39; follow-up as needed  In the past, he has had some flucuating heart rates while exercising. He tolerates riding the bike well.In 2020, he increased his bike duration and decreased carbs. His lipids were improved in 12/2018.  [place] vaccine.  Since the last visit, he has had issues with loss of balance and a fall in July 2021.  He was stepping over a child protective gate at the base of the stairs.  He got stressed and went to ER. Troponins were negative. BNP negative.  CT head was negative for any trauma.    He was referred to neurology.  He also reported some increased HR.  EMS has done an ECG and he was told he had NSR with PVCs.  Cr 1.07.  He has had some episodes  where is resting HR has been high as well.   He continues to have frequent urination and dizziness.  He feels that his HR is going up faster with exercise.  He is sweating more. No SHOB or chest pain with exercise.   Still has some orthostatic sx when getting up from the floor after doing exercises.   Had an episode where he started tilting to the left when he was walking.     Past Medical History:  Diagnosis Date   Anxiety    Arthritis    Coronary artery disease 07/13/2005   CABG   DDD (degenerative disc disease)    DJD (degenerative joint disease)    Elevated liver enzymes    GERD (gastroesophageal reflux disease)    Hemangioma of liver    Hyperlipemia    PAC (premature atrial contraction)    Paroxysmal atrial fibrillation (HCC)    Pneumonia    PVC (premature ventricular contraction)     Past Surgical History:  Procedure Laterality Date   CORONARY ARTERY BYPASS GRAFT  07/13/2005   INGUINAL HERNIA REPAIR Right 1978, 2000   x 2   KNEE SURGERY Right    SEPTOPLASTY       Current Outpatient Medications  Medication Sig Dispense Refill   aspirin EC 81 MG tablet Take 1 tablet (81 mg total) by mouth daily.     Cholecalciferol (VITAMIN D) 2000 UNITS tablet Take 2,000 Units by mouth daily.  Flaxseed, Linseed, POWD Take 2 scoop by mouth as directed.     ibuprofen (ADVIL,MOTRIN) 200 MG tablet Take 200 mg by mouth every 6 (six) hours as needed.     LORazepam (ATIVAN) 0.5 MG tablet Take 1 tablet by mouth at bedtime as needed for sleep. To help with sleep     metoprolol succinate (TOPROL-XL) 25 MG 24 hr tablet TAKE 1 TABLET TWICE A DAY  *LANNETT MFR* 180 tablet 2   metoprolol tartrate (LOPRESSOR) 25 MG tablet Take 1/2 -1 tab as needed 180 tablet 3   Multiple Vitamin (MULTIVITAMIN WITH MINERALS) TABS Take 1 tablet by mouth daily. Centrum silver     nitroGLYCERIN (NITROSTAT) 0.4 MG SL tablet PLACE 1 TABLET (0.4 MG TOTAL) UNDER THE TONGUE EVERY 5 (FIVE)  MINUTES AS NEEDED FOR CHEST PAIN (3 DOSES MAX). 25 tablet 6   pramoxine-hydrocortisone (ANALPRAM HC) cream Apply 1 application topically at bedtime as needed (for skin).      Psyllium (METAMUCIL PO) 1 tablespoons by mouth as needed, for consitpation     rosuvastatin (CRESTOR) 20 MG tablet TAKE 1 TABLET DAILY        *SANDOZ* 90 tablet 3   Ubiquinol 100 MG CAPS Take 100 mg by mouth daily.     No current facility-administered medications for this visit.    Allergies:   Cephalexin, Levofloxacin, Clarithromycin, Penicillins, Ciprofloxacin, Doxycycline, and Lipitor [atorvastatin]    Social History:  The patient  reports that he quit smoking about 45 years ago. His smoking use included cigarettes. He has never used smokeless tobacco. He reports current alcohol use. He reports that he does not use drugs.   Family History:  The patient's *family history includes Atrial fibrillation (age of onset: 31) in his mother; Colon polyps in his mother; Coronary artery disease in his father; Heart attack in his father, maternal grandfather, paternal grandfather, and paternal grandmother; Prostate cancer in his maternal uncle; Seizures in his brother.    ROS:  Please see the history of present illness.   Otherwise, review of systems are positive for dizziness.   All other systems are reviewed and negative.    PHYSICAL EXAM: VS:  BP 116/68    Pulse 74    Ht 5\' 8"  (1.727 m)    Wt 164 lb 9.6 oz (74.7 kg)    SpO2 98%    BMI 25.03 kg/m  , BMI Body mass index is 25.03 kg/m. GEN: Well nourished, well developed, in no acute distress  HEENT: normal  Neck: no JVD, carotid bruits, or masses Cardiac: RRR; no murmurs, rubs, or gallops,no edema  Respiratory:  clear to auscultation bilaterally, normal work of breathing GI: soft, nontender, nondistended, + BS MS: no deformity or atrophy  Skin: warm and dry, no rash Neuro:  Strength and sensation are intact Psych: euthymic mood, full affect   EKG:   The ekg  ordered from EMS demonstrates NSR, PVC   Recent Labs: 08/13/2019: ALT 27; BUN 13; Creatinine, Ser 1.12; Potassium 4.3; Sodium 141   Lipid Panel    Component Value Date/Time   CHOL 116 01/23/2017 1030   CHOL 121 02/09/2016 1206   CHOL 124 07/28/2014 0903   TRIG 108 01/23/2017 1030   TRIG 112 07/28/2014 0903   HDL 46 01/23/2017 1030   HDL 46 07/28/2014 0903   CHOLHDL 2.5 02/09/2016 1206   LDLCALC 53 02/09/2016 1206   LDLCALC 56 07/28/2014 0903     Other studies Reviewed: Additional studies/ records that were reviewed today  with results demonstrating: Echocardiogram in 2019 showed normal LV function and normal valvular function..   ASSESSMENT AND PLAN:  1. CAD: No angina. Continue aggressive secondary prevention.  Check echo given slight change in exercise tolerance.  2. PVCs: Noted on EMS ECG.   He has mild sx.  3. Dizziness: referred to neurology. 4. HTN: Rare readings with systolics in the 95 range.  If this gets more frequent, would drop metoprolol to 25 mg daily.  Stay hydrated.  5. PAF: No evidence of PAF.  6. Vertigo:  Some loss of balance when walking.  Concerning for vertigo.  Consider vestibular rehab referral.  7. Prediabetes: Check HbA1C nad CMEt, especially given frequent urination.    Current medicines are reviewed at length with the patient today.  The patient concerns regarding his medicines were addressed.  The following changes have been made:  No change  Labs/ tests ordered today include:  No orders of the defined types were placed in this encounter.   Recommend 150 minutes/week of aerobic exercise Low fat, low carb, high fiber diet recommended  Disposition:   FU in 1 year   Signed, Larae Grooms, MD  09/15/2019 11:01 AM    [facility] [place], [place], [place]  [address] Phone: [phone]; Fax: [phone]

## 2019-09-15 NOTE — Patient Instructions (Signed)
Medication Instructions:  Your physician recommends that you continue on your current medications as directed. Please refer to the Current Medication list given to you today.  *If you need a refill on your cardiac medications before your next appointment, please call your pharmacy*   Lab Work: We will get a CBC and an A1C today  If you have labs (blood work) drawn today and your tests are completely normal, you will receive your results only by: Marland Kitchen MyChart Message (if you have MyChart) OR . A paper copy in the mail If you have any lab test that is abnormal or we need to change your treatment, we will call you to review the results.   Testing/Procedures:  Your physician has requested that you have an echocardiogram. Echocardiography is a painless test that uses sound waves to create images of your heart. It provides your doctor with information about the size and shape of your heart and how well your heart's chambers and valves are working. This procedure takes approximately one hour. There are no restrictions for this procedure.    Follow-Up: You are scheduled for a 6 month follow up appointment on 02/15/2020 at 10:20 AM.

## 2019-09-16 ENCOUNTER — Ambulatory Visit (INDEPENDENT_AMBULATORY_CARE_PROVIDER_SITE_OTHER): Payer: Medicare Other | Admitting: Gastroenterology

## 2019-09-16 ENCOUNTER — Encounter: Payer: Self-pay | Admitting: Gastroenterology

## 2019-09-16 DIAGNOSIS — K649 Unspecified hemorrhoids: Secondary | ICD-10-CM | POA: Diagnosis not present

## 2019-09-16 DIAGNOSIS — Z1211 Encounter for screening for malignant neoplasm of colon: Secondary | ICD-10-CM | POA: Diagnosis not present

## 2019-09-16 LAB — HEMOGLOBIN A1C
Est. average glucose Bld gHb Est-mCnc: 105 mg/dL
Hgb A1c MFr Bld: 5.3 % (ref 4.8–5.6)

## 2019-09-16 LAB — CBC
Hematocrit: 49.3 % (ref 37.5–51.0)
Hemoglobin: 16.3 g/dL (ref 13.0–17.7)
MCH: 30.1 pg (ref 26.6–33.0)
MCHC: 33.1 g/dL (ref 31.5–35.7)
MCV: 91 fL (ref 79–97)
Platelets: 205 10*3/uL (ref 150–450)
RBC: 5.42 x10E6/uL (ref 4.14–5.80)
RDW: 13.2 % (ref 11.6–15.4)
WBC: 6.7 10*3/uL (ref 3.4–10.8)

## 2019-09-16 NOTE — Progress Notes (Signed)
HPI: This is a very pleasant 69 year old man who was referred to me by Lawerance Cruel, MD  to evaluate hemorrhoids, routine risk for colon cancer.    He had hemorrhoid resection sounds like surgically about 20 years ago.  He was told that he had the largest hemorrhoids at the surgeon have ever seen.  He was also told that they would never come back after his operation.  Unfortunately he has been bothered by hemorrhoids again they sound external.  They will intermittently bleed especially if he strains to move his bowels.  They do not seem to bother him otherwise.  He has alternating constipation diarrhea.  Previously he was taking too much over-the-counter fiber supplement he is weaned back to Metamucil every other day and on that regimen his bowels seem to be quite a bit better.  He is bothered by frequent urination.  He thought that since his PSA was low it could not be his prostate causing urinary issues however I discussed this with him and he I think he understands a bit better now that it still could be prostate related.  No fevers or chills.   Old Data Reviewed:  Colonoscopy January 2012 Dr. Verl Blalock for routine risk screening was completely normal.  He was recommended to have repeat colonoscopy for colon cancer screening at 10-year interval.  Abdominal ultrasound August 2018, indication abdominal pain.  Findings normal gallbladder, 7 mm hemangioma in the liver.  Complex cyst of the right kidney which was followed up by MRI.  Blood work June 7353 complete metabolic profile was normal, hemoglobin A1c was also normal    Review of systems: Pertinent positive and negative review of systems were noted in the above HPI section. All other review negative.   Past Medical History:  Diagnosis Date  . Anxiety   . Arthritis   . Coronary artery disease 07/13/2005   CABG  . DDD (degenerative disc disease)   . DJD (degenerative joint disease)   . Elevated liver enzymes   .  GERD (gastroesophageal reflux disease)   . Hemangioma of liver   . Hyperlipemia   . PAC (premature atrial contraction)   . Paroxysmal atrial fibrillation (HCC)   . Pneumonia   . PVC (premature ventricular contraction)     Past Surgical History:  Procedure Laterality Date  . CORONARY ARTERY BYPASS GRAFT  07/13/2005  . INGUINAL HERNIA REPAIR Right 1978, 2000   x 2  . KNEE SURGERY Right   . SEPTOPLASTY      Current Outpatient Medications  Medication Sig Dispense Refill  . aspirin EC 81 MG tablet Take 1 tablet (81 mg total) by mouth daily.    . Cholecalciferol (VITAMIN D) 2000 UNITS tablet Take 2,000 Units by mouth daily.    . Flaxseed, Linseed, POWD Take 2 scoop by mouth as directed.    Marland Kitchen ibuprofen (ADVIL,MOTRIN) 200 MG tablet Take 200 mg by mouth every 6 (six) hours as needed.    Marland Kitchen LORazepam (ATIVAN) 0.5 MG tablet Take 1 tablet by mouth at bedtime as needed for sleep. To help with sleep    . metoprolol succinate (TOPROL-XL) 25 MG 24 hr tablet TAKE 1 TABLET TWICE A DAY  *LANNETT MFR* 180 tablet 2  . metoprolol tartrate (LOPRESSOR) 25 MG tablet Take 1/2 -1 tab as needed 180 tablet 3  . Multiple Vitamin (MULTIVITAMIN WITH MINERALS) TABS Take 1 tablet by mouth daily. Centrum silver    . nitroGLYCERIN (NITROSTAT) 0.4 MG SL tablet PLACE 1  TABLET (0.4 MG TOTAL) UNDER THE TONGUE EVERY 5 (FIVE) MINUTES AS NEEDED FOR CHEST PAIN (3 DOSES MAX). 25 tablet 6  . pramoxine-hydrocortisone (ANALPRAM HC) cream Apply 1 application topically at bedtime as needed (for skin).     . Psyllium (METAMUCIL PO) 1 tablespoons by mouth as needed, for consitpation    . rosuvastatin (CRESTOR) 20 MG tablet TAKE 1 TABLET DAILY        *SANDOZ* 90 tablet 3  . Ubiquinol 100 MG CAPS Take 100 mg by mouth daily.     No current facility-administered medications for this visit.    Allergies as of 09/16/2019 - Review Complete 09/16/2019  Allergen Reaction Noted  . Cephalexin Other (See Comments) 02/28/2010  . Levofloxacin  Other (See Comments) 02/28/2010  . Clarithromycin Other (See Comments) 09/02/2019  . Penicillins Rash and Other (See Comments) 02/28/2010  . Ciprofloxacin Diarrhea 11/09/2013  . Doxycycline Other (See Comments) 11/09/2013  . Lipitor [atorvastatin] Palpitations 06/30/2013    Family History  Problem Relation Age of Onset  . Coronary artery disease Father   . Heart attack Father   . Atrial fibrillation Mother 33  . Colon polyps Mother   . Seizures Brother   . Prostate cancer Maternal Uncle   . Heart attack Maternal Grandfather   . Heart attack Paternal Grandmother   . Heart attack Paternal Grandfather     Social History   Socioeconomic History  . Marital status: Married    Spouse name: Not on file  . Number of children: 1  . Years of education: Not on file  . Highest education level: Not on file  Occupational History  . Occupation: retired    Fish farm manager: RETIRED  Tobacco Use  . Smoking status: Former Smoker    Types: Cigarettes    Quit date: 02/19/1974    Years since quitting: 45.6  . Smokeless tobacco: Never Used  . Tobacco comment: stopped 32 yrs ago  Substance and Sexual Activity  . Alcohol use: Yes    Comment: rare  . Drug use: No  . Sexual activity: Not on file  Other Topics Concern  . Not on file  Social History Narrative   Pt lives in [place] with spouse.  Son is grown.  Retired from [employer].     patient rides bicycle 7 to 8  Mile a day in a 1/2hr.   Social Determinants of Health   Financial Resource Strain:   . Difficulty of Paying Living Expenses:   Food Insecurity:   . Worried About Charity fundraiser in the Last Year:   . Arboriculturist in the Last Year:   Transportation Needs:   . Film/video editor (Medical):   Marland Kitchen Lack of Transportation (Non-Medical):   Physical Activity:   . Days of Exercise per Week:   . Minutes of Exercise per Session:   Stress:   . Feeling of Stress :   Social Connections:   . Frequency of Communication with Friends and  Family:   . Frequency of Social Gatherings with Friends and Family:   . Attends Religious Services:   . Active Member of Clubs or Organizations:   . Attends Archivist Meetings:   Marland Kitchen Marital Status:   Intimate Partner Violence:   . Fear of Current or Ex-Partner:   . Emotionally Abused:   Marland Kitchen Physically Abused:   . Sexually Abused:      Physical Exam: Ht 5\' 8"  (1.727 m)   Wt 166 lb (75.3 kg)  BMI 25.24 kg/m  Constitutional: generally well-appearing Psychiatric: alert and oriented x3 Eyes: extraocular movements intact Mouth: oral pharynx moist, no lesions Neck: supple no lymphadenopathy Cardiovascular: heart regular rate and rhythm Lungs: clear to auscultation bilaterally Abdomen: soft, nontender, nondistended, no obvious ascites, no peritoneal signs, normal bowel sounds Extremities: no lower extremity edema bilaterally Skin: no lesions on visible extremities   Assessment and plan: 69 y.o. male with alternating constipation, loose stools, routine risk for colon cancer, hemorrhoids  First I recommended a repeat colonoscopy at his soonest convenience.  His last one was about 9-1/2 years ago.  This is really a screening examination however he does have very minor intermittent rectal bleeding that is almost certainly related to external hemorrhoids that he knows he has.  I explained to him that if he also has internal hemorrhoids on the examination that I would refer him to 1 my partners to consider in office internal hemorrhoid banding which can help internal as well as external hemorrhoids in some people.  I recommended that he discuss his frequent urination with his primary care physician.   Please see the "Patient Instructions" section for addition details about the plan.   [name], MD [facility] 09/16/2019, 10:00 AM  Cc: Lawerance Cruel, MD  Total time on date of encounter was 45 minutes (this included time spent preparing to see the  patient reviewing records; obtaining and/or reviewing separately obtained history; performing a medically appropriate exam and/or evaluation; counseling and educating the patient and family if present; ordering medications, tests or procedures if applicable; and documenting clinical information in the health record).

## 2019-09-16 NOTE — Patient Instructions (Signed)
If you are age 71 or older, your body mass index should be between 23-30. Your Body mass index is 25.24 kg/m. If this is out of the aforementioned range listed, please consider follow up with your Primary Care Provider.  If you are age 91 or younger, your body mass index should be between 19-25. Your Body mass index is 25.24 kg/m. If this is out of the aformentioned range listed, please consider follow up with your Primary Care Provider.   You have been scheduled for a colonoscopy. Please follow written instructions given to you at your visit today.  Please pick up your prep supplies at the pharmacy within the next 1-3 days. If you use inhalers (even only as needed), please bring them with you on the day of your procedure.  Please continue taking Fiber every other day.  Thank you for entrusting me with your care and choosing [facility].  Dr Ardis Hughs

## 2019-09-23 DIAGNOSIS — R350 Frequency of micturition: Secondary | ICD-10-CM | POA: Diagnosis not present

## 2019-09-23 DIAGNOSIS — R42 Dizziness and giddiness: Secondary | ICD-10-CM | POA: Diagnosis not present

## 2019-09-23 DIAGNOSIS — K1379 Other lesions of oral mucosa: Secondary | ICD-10-CM | POA: Diagnosis not present

## 2019-09-23 DIAGNOSIS — H9209 Otalgia, unspecified ear: Secondary | ICD-10-CM | POA: Diagnosis not present

## 2019-09-24 DIAGNOSIS — J029 Acute pharyngitis, unspecified: Secondary | ICD-10-CM | POA: Diagnosis not present

## 2019-09-25 ENCOUNTER — Other Ambulatory Visit: Payer: Medicare Other

## 2019-09-25 ENCOUNTER — Ambulatory Visit (HOSPITAL_COMMUNITY): Payer: Medicare Other | Attending: Cardiovascular Disease

## 2019-09-25 ENCOUNTER — Other Ambulatory Visit: Payer: Medicare Other | Admitting: *Deleted

## 2019-09-25 ENCOUNTER — Other Ambulatory Visit: Payer: Self-pay

## 2019-09-25 DIAGNOSIS — I2510 Atherosclerotic heart disease of native coronary artery without angina pectoris: Secondary | ICD-10-CM | POA: Diagnosis not present

## 2019-09-25 DIAGNOSIS — R42 Dizziness and giddiness: Secondary | ICD-10-CM | POA: Diagnosis not present

## 2019-09-25 DIAGNOSIS — R350 Frequency of micturition: Secondary | ICD-10-CM

## 2019-09-25 DIAGNOSIS — R7303 Prediabetes: Secondary | ICD-10-CM | POA: Diagnosis not present

## 2019-09-25 LAB — COMPREHENSIVE METABOLIC PANEL
ALT: 38 IU/L (ref 0–44)
AST: 36 IU/L (ref 0–40)
Albumin/Globulin Ratio: 1.5 (ref 1.2–2.2)
Albumin: 4.3 g/dL (ref 3.8–4.8)
Alkaline Phosphatase: 86 IU/L (ref 48–121)
BUN/Creatinine Ratio: 12 (ref 10–24)
BUN: 14 mg/dL (ref 8–27)
Bilirubin Total: 0.5 mg/dL (ref 0.0–1.2)
CO2: 26 mmol/L (ref 20–29)
Calcium: 9.3 mg/dL (ref 8.6–10.2)
Chloride: 101 mmol/L (ref 96–106)
Creatinine, Ser: 1.19 mg/dL (ref 0.76–1.27)
GFR calc Af Amer: 71 mL/min/{1.73_m2} (ref >59)
GFR calc non Af Amer: 62 mL/min/{1.73_m2} (ref >59)
Globulin, Total: 2.8 g/dL (ref 1.5–4.5)
Glucose: 90 mg/dL (ref 65–99)
Potassium: 4.5 mmol/L (ref 3.5–5.2)
Sodium: 139 mmol/L (ref 134–144)
Total Protein: 7.1 g/dL (ref 6.0–8.5)

## 2019-09-25 LAB — ECHOCARDIOGRAM COMPLETE
Area-P 1/2: 3.48 cm2
S' Lateral: 3 cm

## 2019-09-28 DIAGNOSIS — R262 Difficulty in walking, not elsewhere classified: Secondary | ICD-10-CM | POA: Diagnosis not present

## 2019-09-28 DIAGNOSIS — R42 Dizziness and giddiness: Secondary | ICD-10-CM | POA: Diagnosis not present

## 2019-09-28 NOTE — Telephone Encounter (Signed)
Spoke with patient regarding results

## 2019-10-01 DIAGNOSIS — R42 Dizziness and giddiness: Secondary | ICD-10-CM | POA: Diagnosis not present

## 2019-10-01 DIAGNOSIS — R262 Difficulty in walking, not elsewhere classified: Secondary | ICD-10-CM | POA: Diagnosis not present

## 2019-10-05 DIAGNOSIS — M25512 Pain in left shoulder: Secondary | ICD-10-CM | POA: Diagnosis not present

## 2019-10-05 DIAGNOSIS — M25511 Pain in right shoulder: Secondary | ICD-10-CM | POA: Diagnosis not present

## 2019-10-07 DIAGNOSIS — R42 Dizziness and giddiness: Secondary | ICD-10-CM | POA: Diagnosis not present

## 2019-10-07 DIAGNOSIS — R262 Difficulty in walking, not elsewhere classified: Secondary | ICD-10-CM | POA: Diagnosis not present

## 2019-10-12 DIAGNOSIS — R262 Difficulty in walking, not elsewhere classified: Secondary | ICD-10-CM | POA: Diagnosis not present

## 2019-10-12 DIAGNOSIS — R42 Dizziness and giddiness: Secondary | ICD-10-CM | POA: Diagnosis not present

## 2019-10-14 DIAGNOSIS — R262 Difficulty in walking, not elsewhere classified: Secondary | ICD-10-CM | POA: Diagnosis not present

## 2019-10-14 DIAGNOSIS — R42 Dizziness and giddiness: Secondary | ICD-10-CM | POA: Diagnosis not present

## 2019-10-18 DIAGNOSIS — M179 Osteoarthritis of knee, unspecified: Secondary | ICD-10-CM | POA: Diagnosis not present

## 2019-10-18 DIAGNOSIS — E782 Mixed hyperlipidemia: Secondary | ICD-10-CM | POA: Diagnosis not present

## 2019-10-18 DIAGNOSIS — I2510 Atherosclerotic heart disease of native coronary artery without angina pectoris: Secondary | ICD-10-CM | POA: Diagnosis not present

## 2019-10-19 DIAGNOSIS — R262 Difficulty in walking, not elsewhere classified: Secondary | ICD-10-CM | POA: Diagnosis not present

## 2019-10-19 DIAGNOSIS — R42 Dizziness and giddiness: Secondary | ICD-10-CM | POA: Diagnosis not present

## 2019-10-21 DIAGNOSIS — R262 Difficulty in walking, not elsewhere classified: Secondary | ICD-10-CM | POA: Diagnosis not present

## 2019-10-21 DIAGNOSIS — R42 Dizziness and giddiness: Secondary | ICD-10-CM | POA: Diagnosis not present

## 2019-10-27 ENCOUNTER — Encounter: Payer: Medicare Other | Admitting: Gastroenterology

## 2019-10-29 DIAGNOSIS — R262 Difficulty in walking, not elsewhere classified: Secondary | ICD-10-CM | POA: Diagnosis not present

## 2019-10-29 DIAGNOSIS — R42 Dizziness and giddiness: Secondary | ICD-10-CM | POA: Diagnosis not present

## 2019-11-02 DIAGNOSIS — M25512 Pain in left shoulder: Secondary | ICD-10-CM | POA: Diagnosis not present

## 2019-11-02 DIAGNOSIS — M25511 Pain in right shoulder: Secondary | ICD-10-CM | POA: Diagnosis not present

## 2019-11-03 DIAGNOSIS — Z23 Encounter for immunization: Secondary | ICD-10-CM | POA: Diagnosis not present

## 2019-11-06 ENCOUNTER — Other Ambulatory Visit: Payer: Self-pay

## 2019-11-06 ENCOUNTER — Ambulatory Visit (INDEPENDENT_AMBULATORY_CARE_PROVIDER_SITE_OTHER): Payer: Medicare Other | Admitting: Diagnostic Neuroimaging

## 2019-11-06 ENCOUNTER — Encounter: Payer: Self-pay | Admitting: Diagnostic Neuroimaging

## 2019-11-06 VITALS — BP 117/69 | HR 77 | Ht 68.0 in | Wt 168.0 lb

## 2019-11-06 DIAGNOSIS — R269 Unspecified abnormalities of gait and mobility: Secondary | ICD-10-CM | POA: Diagnosis not present

## 2019-11-06 NOTE — Progress Notes (Signed)
[facility]  PATIENT: Noah [name] DOB: 1950-10-12  REFERRING CLINICIAN: Lawerance Cruel, MD HISTORY FROM: patient  REASON FOR VISIT: new consult    HISTORICAL  CHIEF COMPLAINT:  Chief Complaint  Patient presents with  . New Patient (Initial Visit)    pt alone, rm 7. pt here today because 7/2 received a cortisone shot and same day he bent over and became dizzy. the another occurance ( which has happened intermittently before) started walking to the left and couldnt walk straight. since then he has felt unsteady on feet. he states if turns head to right room starts spinning. he worked with therapy and it has cleared up after 6 sessions. no longer having the dizziness but wanted to still adress the gait concerns that occur randomly.     HISTORY OF PRESENT ILLNESS:   69 year old male here for evaluation of balance and dizziness problems.    Since age 20 years old patient has had intermittent episodes where he "lists to the left" for a few seconds at a time.  No specific triggering aggravating factors.  Patient does not fall down.  Episodes occur randomly, spaced out by years or decades at a time.  Patient recalls an episode about 15 years ago and then another one in July 2021.  08/21/2019 patient had right shoulder cortisone injection.  Following this he had an episode of listing to the left side for a few seconds.  He is also had some intermittent positional vertigo symptoms, diagnosed as benign positional vertigo.  He went to vestibular PT which has helped resolve the symptoms as of 2 weeks ago.  Patient also has some other "brain chill" sensations which previously he described as lightheadedness.  However he does not true lightheadedness as patient does not feel faitn sensation or that he is going to pass out.   Patient having some anxiety about the symptoms.  He is worried about possible neurologic diagnoses such as Parkinson's disease, epilepsy or other  neurologic disorders.    REVIEW OF SYSTEMS: Full 14 system review of systems performed and negative with exception of: as per HPI.  ALLERGIES: Allergies  Allergen Reactions  . Cephalexin Other (See Comments)    REACTION: tachycardia Rapid heart beat  . Levofloxacin Other (See Comments)    REACTION: tachycardia Rapid heartbeat  . Clarithromycin Other (See Comments)    Mouth got white spots.  . Penicillins Rash and Other (See Comments)    REACTION: rash Unsure  . Ciprofloxacin Diarrhea  . Doxycycline Other (See Comments)  . Lipitor [Atorvastatin] Palpitations    Muscle aches on lipitor 80 mg daily    HOME MEDICATIONS: Outpatient Medications Prior to Visit  Medication Sig Dispense Refill  . aspirin EC 81 MG tablet Take 1 tablet (81 mg total) by mouth daily.    . Cholecalciferol (VITAMIN D) 2000 UNITS tablet Take 2,000 Units by mouth daily.    . Flaxseed, Linseed, POWD Take 2 scoop by mouth as directed.    Marland Kitchen ibuprofen (ADVIL,MOTRIN) 200 MG tablet Take 200 mg by mouth every 6 (six) hours as needed.    Marland Kitchen LORazepam (ATIVAN) 0.5 MG tablet Take 1 tablet by mouth at bedtime as needed for sleep. To help with sleep    . metoprolol succinate (TOPROL-XL) 25 MG 24 hr tablet TAKE 1 TABLET TWICE A DAY  *LANNETT MFR* 180 tablet 2  . metoprolol tartrate (LOPRESSOR) 25 MG tablet Take 1/2 -1 tab as needed 180 tablet 3  . Multiple Vitamin (  MULTIVITAMIN WITH MINERALS) TABS Take 1 tablet by mouth daily. Centrum silver    . nitroGLYCERIN (NITROSTAT) 0.4 MG SL tablet PLACE 1 TABLET (0.4 MG TOTAL) UNDER THE TONGUE EVERY 5 (FIVE) MINUTES AS NEEDED FOR CHEST PAIN (3 DOSES MAX). 25 tablet 6  . pramoxine-hydrocortisone (ANALPRAM HC) cream Apply 1 application topically at bedtime as needed (for skin).     . Psyllium (METAMUCIL PO) 1 tablespoons by mouth as needed, for consitpation    . rosuvastatin (CRESTOR) 20 MG tablet TAKE 1 TABLET DAILY        *SANDOZ* 90 tablet 3  . Ubiquinol 100 MG CAPS Take 100 mg by  mouth daily.    . meclizine (ANTIVERT) 25 MG tablet Take 25 mg by mouth 3 (three) times daily as needed.     No facility-administered medications prior to visit.    PAST MEDICAL HISTORY: Past Medical History:  Diagnosis Date  . Anxiety   . Arthritis   . Coronary artery disease 07/13/2005   CABG  . DDD (degenerative disc disease)   . DJD (degenerative joint disease)   . Elevated liver enzymes   . GERD (gastroesophageal reflux disease)   . Hemangioma of liver   . Hyperlipemia   . PAC (premature atrial contraction)   . Paroxysmal atrial fibrillation (HCC)   . Pneumonia   . PVC (premature ventricular contraction)     PAST SURGICAL HISTORY: Past Surgical History:  Procedure Laterality Date  . CORONARY ARTERY BYPASS GRAFT  07/13/2005  . INGUINAL HERNIA REPAIR Right 1978, 2000   x 2  . KNEE SURGERY Right   . SEPTOPLASTY      FAMILY HISTORY: Family History  Problem Relation Age of Onset  . Coronary artery disease Father   . Heart attack Father   . Atrial fibrillation Mother 64  . Colon polyps Mother   . Seizures Brother   . Prostate cancer Maternal Uncle   . Heart attack Maternal Grandfather   . Heart attack Paternal Grandmother   . Heart attack Paternal Grandfather     SOCIAL HISTORY: Social History   Socioeconomic History  . Marital status: Married    Spouse name: Not on file  . Number of children: 1  . Years of education: Not on file  . Highest education level: Not on file  Occupational History  . Occupation: retired    Fish farm manager: RETIRED  Tobacco Use  . Smoking status: Former Smoker    Types: Cigarettes    Quit date: 02/19/1974    Years since quitting: 45.7  . Smokeless tobacco: Never Used  . Tobacco comment: stopped 32 yrs ago  Substance and Sexual Activity  . Alcohol use: Yes    Comment: rare  . Drug use: No  . Sexual activity: Not on file  Other Topics Concern  . Not on file  Social History Narrative   Pt lives in [place] with spouse.  Son is  grown.  Retired from [employer].     patient rides bicycle 7 to 8  Mile a day in a 1/2hr.   Social Determinants of Health   Financial Resource Strain:   . Difficulty of Paying Living Expenses: Not on file  Food Insecurity:   . Worried About Charity fundraiser in the Last Year: Not on file  . Ran Out of Food in the Last Year: Not on file  Transportation Needs:   . Lack of Transportation (Medical): Not on file  . Lack of Transportation (Non-Medical): Not on file  Physical Activity:   . Days of Exercise per Week: Not on file  . Minutes of Exercise per Session: Not on file  Stress:   . Feeling of Stress : Not on file  Social Connections:   . Frequency of Communication with Friends and Family: Not on file  . Frequency of Social Gatherings with Friends and Family: Not on file  . Attends Religious Services: Not on file  . Active Member of Clubs or Organizations: Not on file  . Attends Archivist Meetings: Not on file  . Marital Status: Not on file  Intimate Partner Violence:   . Fear of Current or Ex-Partner: Not on file  . Emotionally Abused: Not on file  . Physically Abused: Not on file  . Sexually Abused: Not on file     PHYSICAL EXAM  GENERAL EXAM/CONSTITUTIONAL: Vitals:  Vitals:   11/06/19 1102  BP: 117/69  Pulse: 77  Weight: 168 lb (76.2 kg)  Height: 5\' 8"  (1.727 m)     Body mass index is 25.54 kg/m. Wt Readings from Last 3 Encounters:  11/06/19 168 lb (76.2 kg)  09/16/19 166 lb (75.3 kg)  09/15/19 164 lb 9.6 oz (74.7 kg)     Patient is in no distress; well developed, nourished and groomed; neck is supple  CARDIOVASCULAR:  Examination of carotid arteries is normal; no carotid bruits  Regular rate and rhythm, no murmurs  Examination of peripheral vascular system by observation and palpation is normal  EYES:  Ophthalmoscopic exam of optic discs and posterior segments is normal; no papilledema or hemorrhages  No exam data  present  MUSCULOSKELETAL:  Gait, strength, tone, movements noted in Neurologic exam below  NEUROLOGIC: MENTAL STATUS:  No flowsheet data found.  awake, alert, oriented to person, place and time  recent and remote memory intact  normal attention and concentration  language fluent, comprehension intact, naming intact  fund of knowledge appropriate  CRANIAL NERVE:   2nd - no papilledema on fundoscopic exam  2nd, 3rd, 4th, 6th - pupils equal and reactive to light, visual fields full to confrontation, extraocular muscles intact, no nystagmus  5th - facial sensation symmetric  7th - facial strength symmetric  8th - hearing intact  9th - palate elevates symmetrically, uvula midline  11th - shoulder shrug symmetric  12th - tongue protrusion midline  MOTOR:   normal bulk and tone, full strength in the BUE, BLE  SENSORY:   normal and symmetric to light touch, temperature, vibration  COORDINATION:   finger-nose-finger, fine finger movements normal  REFLEXES:   deep tendon reflexes present and symmetric  GAIT/STATION:   narrow based gait; able to walk on toes, heels and tandem; romberg is negative     DIAGNOSTIC DATA (LABS, IMAGING, TESTING) - I reviewed patient records, labs, notes, testing and imaging myself where available.  Lab Results  Component Value Date   WBC 6.7 09/15/2019   HGB 16.3 09/15/2019   HCT 49.3 09/15/2019   MCV 91 09/15/2019   PLT 205 09/15/2019      Component Value Date/Time   NA 139 09/25/2019 0916   K 4.5 09/25/2019 0916   CL 101 09/25/2019 0916   CO2 26 09/25/2019 0916   GLUCOSE 90 09/25/2019 0916   GLUCOSE 82 02/09/2016 1206   BUN 14 09/25/2019 0916   CREATININE 1.19 09/25/2019 0916   CREATININE 1.03 09/20/2015 1256   CALCIUM 9.3 09/25/2019 0916   PROT 7.1 09/25/2019 0916   ALBUMIN 4.3 09/25/2019 0916  AST 36 09/25/2019 0916   ALT 38 09/25/2019 0916   ALKPHOS 86 09/25/2019 0916   BILITOT 0.5 09/25/2019 0916    GFRNONAA 62 09/25/2019 0916   GFRAA 71 09/25/2019 0916   Lab Results  Component Value Date   CHOL 116 01/23/2017   HDL 46 01/23/2017   LDLCALC 53 02/09/2016   TRIG 108 01/23/2017   CHOLHDL 2.5 02/09/2016   Lab Results  Component Value Date   HGBA1C 5.3 09/15/2019   Lab Results  Component Value Date   YCXKGYJE56 314 01/27/2013   Lab Results  Component Value Date   TSH 3.30 01/27/2013    04/13/15 CT head [I reviewed images myself and agree with interpretation. -VRP]  - negative  09/02/19 CT head  - MODERATE BRAIN ATROPHY. OTHERWISE NEGATIVE CT OF THE BRAIN. NO HEMORRHAGE OR OTHER ACUTE ABNORMALITY.     ASSESSMENT AND PLAN  69 y.o. year old male here with:  Dx:  1. Gait difficulty     PLAN:  INTERMITTENT BALANCE DIFFICULTY (since age 69 years old) - non-specific; neuro exam normal - check MRI brain  INTERMITTENT VERTIGO (BPPV) - now resolved  "BRAIN CHILL" sensation - non-specific findings  Orders Placed This Encounter  Procedures  . MR BRAIN W WO CONTRAST   Return for pending if symptoms worsen or fail to improve.    Penni Bombard, MD 9/70/2637, 85:88 AM Certified in Neurology, Neurophysiology and Neuroimaging  [facility] [address] [place], [place] [address] [phone]

## 2019-11-06 NOTE — Patient Instructions (Signed)
INTERMITTENT BALANCE DIFFICULTY (since age 69 years old) - non-specific; neuro exam normal - check MRI brain  INTERMITTENT VERTIGO (BPPV) - now resolved  "BRAIN CHILL" sensation - non-specific findings

## 2019-11-09 ENCOUNTER — Telehealth: Payer: Self-pay | Admitting: Diagnostic Neuroimaging

## 2019-11-09 NOTE — Telephone Encounter (Signed)
Medicare/Aarp order sent to GI. No auth they will reach out to the patient to schedule.

## 2019-11-16 DIAGNOSIS — L821 Other seborrheic keratosis: Secondary | ICD-10-CM | POA: Diagnosis not present

## 2019-11-16 DIAGNOSIS — L853 Xerosis cutis: Secondary | ICD-10-CM | POA: Diagnosis not present

## 2019-11-16 DIAGNOSIS — L820 Inflamed seborrheic keratosis: Secondary | ICD-10-CM | POA: Diagnosis not present

## 2019-11-18 DIAGNOSIS — M25512 Pain in left shoulder: Secondary | ICD-10-CM | POA: Diagnosis not present

## 2019-11-18 DIAGNOSIS — M25511 Pain in right shoulder: Secondary | ICD-10-CM | POA: Diagnosis not present

## 2019-11-23 DIAGNOSIS — Z23 Encounter for immunization: Secondary | ICD-10-CM | POA: Diagnosis not present

## 2019-11-25 ENCOUNTER — Other Ambulatory Visit: Payer: Medicare Other

## 2019-11-30 DIAGNOSIS — M25512 Pain in left shoulder: Secondary | ICD-10-CM | POA: Diagnosis not present

## 2019-11-30 DIAGNOSIS — M25511 Pain in right shoulder: Secondary | ICD-10-CM | POA: Diagnosis not present

## 2019-12-01 DIAGNOSIS — M179 Osteoarthritis of knee, unspecified: Secondary | ICD-10-CM | POA: Diagnosis not present

## 2019-12-01 DIAGNOSIS — I2510 Atherosclerotic heart disease of native coronary artery without angina pectoris: Secondary | ICD-10-CM | POA: Diagnosis not present

## 2019-12-01 DIAGNOSIS — E782 Mixed hyperlipidemia: Secondary | ICD-10-CM | POA: Diagnosis not present

## 2019-12-08 DIAGNOSIS — M25511 Pain in right shoulder: Secondary | ICD-10-CM | POA: Diagnosis not present

## 2019-12-08 DIAGNOSIS — M25512 Pain in left shoulder: Secondary | ICD-10-CM | POA: Diagnosis not present

## 2019-12-16 ENCOUNTER — Other Ambulatory Visit: Payer: Self-pay | Admitting: Interventional Cardiology

## 2019-12-16 DIAGNOSIS — M25511 Pain in right shoulder: Secondary | ICD-10-CM | POA: Diagnosis not present

## 2019-12-16 DIAGNOSIS — M25512 Pain in left shoulder: Secondary | ICD-10-CM | POA: Diagnosis not present

## 2019-12-24 DIAGNOSIS — F411 Generalized anxiety disorder: Secondary | ICD-10-CM | POA: Diagnosis not present

## 2019-12-24 DIAGNOSIS — R42 Dizziness and giddiness: Secondary | ICD-10-CM | POA: Diagnosis not present

## 2019-12-28 ENCOUNTER — Encounter: Payer: Medicare Other | Admitting: Gastroenterology

## 2019-12-28 DIAGNOSIS — R269 Unspecified abnormalities of gait and mobility: Secondary | ICD-10-CM

## 2020-01-04 ENCOUNTER — Other Ambulatory Visit: Payer: Medicare Other

## 2020-01-04 ENCOUNTER — Ambulatory Visit
Admission: RE | Admit: 2020-01-04 | Discharge: 2020-01-04 | Disposition: A | Discharge status: Home / Self Care | Payer: Medicare Other | Source: Ambulatory Visit | Attending: Diagnostic Neuroimaging | Admitting: Diagnostic Neuroimaging

## 2020-01-04 ENCOUNTER — Other Ambulatory Visit: Payer: Self-pay

## 2020-01-04 DIAGNOSIS — R269 Unspecified abnormalities of gait and mobility: Secondary | ICD-10-CM

## 2020-01-06 ENCOUNTER — Other Ambulatory Visit: Payer: Self-pay

## 2020-01-06 ENCOUNTER — Encounter: Payer: Self-pay | Admitting: Gastroenterology

## 2020-01-06 MED ORDER — ROSUVASTATIN CALCIUM 20 MG PO TABS: ORAL_TABLET | ORAL | 2 refills | Status: DC

## 2020-01-06 NOTE — Telephone Encounter (Signed)
Pt's medication was sent to pt's pharmacy as requested. Confirmation received.  °

## 2020-01-07 ENCOUNTER — Telehealth: Payer: Self-pay | Admitting: Diagnostic Neuroimaging

## 2020-01-07 NOTE — Telephone Encounter (Signed)
I called patient to review MRI results. MRI brain looks good. No further testing recommended. Follow up with PCP. -VRP

## 2020-01-08 DIAGNOSIS — M25512 Pain in left shoulder: Secondary | ICD-10-CM | POA: Diagnosis not present

## 2020-01-08 DIAGNOSIS — M25511 Pain in right shoulder: Secondary | ICD-10-CM | POA: Diagnosis not present

## 2020-01-08 MED ORDER — NITROGLYCERIN 0.4 MG SL SUBL: 0.4000 mg | SUBLINGUAL_TABLET | SUBLINGUAL | 6 refills | Status: DC | PRN

## 2020-01-29 ENCOUNTER — Other Ambulatory Visit: Payer: Self-pay

## 2020-01-29 ENCOUNTER — Other Ambulatory Visit: Payer: Medicare Other | Admitting: *Deleted

## 2020-01-29 DIAGNOSIS — Z125 Encounter for screening for malignant neoplasm of prostate: Secondary | ICD-10-CM

## 2020-01-29 DIAGNOSIS — E782 Mixed hyperlipidemia: Secondary | ICD-10-CM | POA: Diagnosis not present

## 2020-01-29 DIAGNOSIS — I480 Paroxysmal atrial fibrillation: Secondary | ICD-10-CM | POA: Diagnosis not present

## 2020-01-29 DIAGNOSIS — I2510 Atherosclerotic heart disease of native coronary artery without angina pectoris: Secondary | ICD-10-CM

## 2020-02-01 LAB — COMPREHENSIVE METABOLIC PANEL
ALT: 35 IU/L (ref 0–44)
AST: 26 IU/L (ref 0–40)
Albumin/Globulin Ratio: 1.8 (ref 1.2–2.2)
Albumin: 4.6 g/dL (ref 3.8–4.8)
Alkaline Phosphatase: 96 IU/L (ref 44–121)
BUN/Creatinine Ratio: 11 (ref 10–24)
BUN: 12 mg/dL (ref 8–27)
Bilirubin Total: 0.7 mg/dL (ref 0.0–1.2)
CO2: 24 mmol/L (ref 20–29)
Calcium: 9.5 mg/dL (ref 8.6–10.2)
Chloride: 99 mmol/L (ref 96–106)
Creatinine, Ser: 1.14 mg/dL (ref 0.76–1.27)
GFR calc Af Amer: 75 mL/min/{1.73_m2} (ref >59)
GFR calc non Af Amer: 65 mL/min/{1.73_m2} (ref >59)
Globulin, Total: 2.5 g/dL (ref 1.5–4.5)
Glucose: 94 mg/dL (ref 65–99)
Potassium: 4.2 mmol/L (ref 3.5–5.2)
Sodium: 139 mmol/L (ref 134–144)
Total Protein: 7.1 g/dL (ref 6.0–8.5)

## 2020-02-01 LAB — NMR, LIPOPROFILE
Cholesterol, Total: 114 mg/dL (ref 100–199)
HDL Particle Number: 36.4 umol/L (ref >=30.5)
HDL-C: 56 mg/dL (ref >39)
LDL Particle Number: 640 nmol/L (ref <1000)
LDL Size: 19.9 nm — ABNORMAL LOW (ref >20.5)
LDL-C (NIH Calc): 43 mg/dL (ref 0–99)
LP-IR Score: <25 (ref <=45)
Small LDL Particle Number: 374 nmol/L (ref <=527)
Triglycerides: 74 mg/dL (ref 0–149)

## 2020-02-01 LAB — PSA: Prostate Specific Ag, Serum: 0.4 ng/mL (ref 0.0–4.0)

## 2020-02-01 LAB — APOLIPOPROTEIN B: Apolipoprotein B: 78 mg/dL (ref <90)

## 2020-02-03 DIAGNOSIS — L820 Inflamed seborrheic keratosis: Secondary | ICD-10-CM | POA: Diagnosis not present

## 2020-02-03 DIAGNOSIS — K130 Diseases of lips: Secondary | ICD-10-CM | POA: Diagnosis not present

## 2020-02-04 NOTE — Progress Notes (Signed)
Cardiology Office Note   Date:  02/05/2020   ID:  Noah [name], DOB Jul 17, 1950, MRN [id number]  PCP:  Lawerance Cruel, MD    No chief complaint on file.  CAD  Wt Readings from Last 3 Encounters:  02/05/20 173 lb 3.2 oz (78.6 kg)  11/06/19 168 lb (76.2 kg)  09/16/19 166 lb (75.3 kg)       History of Present Illness: Noah [name] is a 69 y.o. male   with coronary artery disease status post CABG in 2007, remote paroxysmal atrial fibrillation (noted many years ago), hyperlipidemia (intolerant of some statins).In 2/19,He has had some palpitations. 48-hour Holter monitor demonstrated normal rhythm, PVCs and PACs. There wasno atrial fibrillation noted.  He was evaluated for palpitations in the office in 9/19. He was under stress. He was taking an extra half of metoprolol with less benefit than usual.   Echocardiogram 06/10/2017 Mild LVH, EF 65-70, normal wall motion, grade 1 diastolic dysfunction, aortic sclerosis without stenosis, trivial AI, mild LAE  48-hour Holter 04/01/2017 Normal sinus rhythm, PACs and PVCs correlate with symptoms; no significant arrhythmias  Nuclear stress test 10/23/2016 EF 67, no ischemia, low risk  Carotid US 04/27/2015 Bilateral ICA 1-39; follow-up as needed  In the past, he has had some flucuating heart rates while exercising. He tolerates riding the bike well.In 2020,he increased his bike duration and decreased carbs. His lipids were improved in 12/2018.  [place] vaccine.  He had issues with loss of balance and a fall in July 2021.  He was stepping over a child protective gate at the base of the stairs.  He got stressed and went to ER. Troponins were negative. BNP negative.  CT head was negative for any trauma.    He was referred to neurology.  He also reported some increased HR.  EMS has done an ECG and he was told he had NSR with PVCs.  Cr 1.07.  He has had some episodes where is resting HR has been  high as well.   He continues to have frequent urination and dizziness.  He feels that his HR is going up faster with exercise.  He is sweating more. No SHOB or chest pain with exercise.   Still has some orthostatic sx when getting up from the floor after doing exercises.   Had an episode where he started tilting to the left when he was walking.    Since the last visit, he has felt well.  Vertigo is better.  Rare PVCs.  Denies : Chest pain. Dizziness. Leg edema. Nitroglycerin use. Orthopnea.  Paroxysmal nocturnal dyspnea. Shortness of breath. Syncope.   Had MRI of head and neuro f/u.  BP has been controlled.  Stress with mother's illness.       Past Medical History:  Diagnosis Date  . Anxiety   . Arthritis   . Coronary artery disease 07/13/2005   CABG  . DDD (degenerative disc disease)   . DJD (degenerative joint disease)   . Elevated liver enzymes   . GERD (gastroesophageal reflux disease)   . Hemangioma of liver   . Hyperlipemia   . PAC (premature atrial contraction)   . Paroxysmal atrial fibrillation (HCC)   . Pneumonia   . PVC (premature ventricular contraction)     Past Surgical History:  Procedure Laterality Date  . CORONARY ARTERY BYPASS GRAFT  07/13/2005  . INGUINAL HERNIA REPAIR Right 1978, 2000   x 2  . KNEE SURGERY Right   .  SEPTOPLASTY       Current Outpatient Medications  Medication Sig Dispense Refill  . aspirin EC 81 MG tablet Take 1 tablet (81 mg total) by mouth daily.    . Cholecalciferol (VITAMIN D) 2000 UNITS tablet Take 2,000 Units by mouth daily.    . Flaxseed, Linseed, POWD Take 2 scoop by mouth as directed.    Marland Kitchen ibuprofen (ADVIL,MOTRIN) 200 MG tablet Take 200 mg by mouth every 6 (six) hours as needed.    Marland Kitchen LORazepam (ATIVAN) 0.5 MG tablet Take 1 tablet by mouth at bedtime as needed for sleep. To help with sleep    . metoprolol succinate (TOPROL-XL) 25 MG 24 hr tablet TAKE 1 TABLET TWICE A DAY  *LANNETT MFR* 180 tablet 0  . metoprolol  tartrate (LOPRESSOR) 25 MG tablet Take 1/2 -1 tab as needed 180 tablet 3  . Multiple Vitamin (MULTIVITAMIN WITH MINERALS) TABS Take 1 tablet by mouth daily. Centrum silver    . nitroGLYCERIN (NITROSTAT) 0.4 MG SL tablet Place 1 tablet (0.4 mg total) under the tongue every 5 (five) minutes as needed for chest pain (3 DOSES MAX). 25 tablet 6  . pramoxine-hydrocortisone (ANALPRAM HC) cream Apply 1 application topically at bedtime as needed (for skin).     . Psyllium (METAMUCIL PO) 1 tablespoons by mouth as needed, for consitpation    . rosuvastatin (CRESTOR) 20 MG tablet TAKE 1 TABLET DAILY        *SANDOZ* 90 tablet 2  . Ubiquinol 100 MG CAPS Take 100 mg by mouth daily.     No current facility-administered medications for this visit.    Allergies:   Cephalexin, Levofloxacin, Clarithromycin, Penicillins, Ciprofloxacin, Doxycycline, and Lipitor [atorvastatin]    Social History:  The patient  reports that he quit smoking about 45 years ago. His smoking use included cigarettes. He has never used smokeless tobacco. He reports current alcohol use. He reports that he does not use drugs.   Family History:  The patient's family history includes Atrial fibrillation (age of onset: 43) in his mother; Colon polyps in his mother; Coronary artery disease in his father; Heart attack in his father, maternal grandfather, paternal grandfather, and paternal grandmother; Prostate cancer in his maternal uncle; Seizures in his brother.    ROS:  Please see the history of present illness.   Otherwise, review of systems are positive for .   All other systems are reviewed and negative.    PHYSICAL EXAM: VS:  BP 132/70   Pulse 75   Ht 5\' 8"  (1.727 m)   Wt 173 lb 3.2 oz (78.6 kg)   SpO2 98%   BMI 26.33 kg/m  , BMI Body mass index is 26.33 kg/m. GEN: Well nourished, well developed, in no acute distress  HEENT: normal  Neck: no JVD, carotid bruits, or masses Cardiac: RRR; no murmurs, rubs, or gallops,no edema   Respiratory:  clear to auscultation bilaterally, normal work of breathing GI: soft, nontender, nondistended, + BS MS: no deformity or atrophy  Skin: warm and dry, no rash Neuro:  Strength and sensation are intact Psych: euthymic mood, full affect     Recent Labs: 09/15/2019: Hemoglobin 16.3; Platelets 205 01/29/2020: ALT 35; BUN 12; Creatinine, Ser 1.14; Potassium 4.2; Sodium 139   Lipid Panel    Component Value Date/Time   CHOL 116 01/23/2017 1030   CHOL 121 02/09/2016 1206   CHOL 124 07/28/2014 0903   TRIG 108 01/23/2017 1030   TRIG 112 07/28/2014 0903   HDL 46  01/23/2017 1030   HDL 46 07/28/2014 0903   CHOLHDL 2.5 02/09/2016 1206   LDLCALC 53 02/09/2016 1206   LDLCALC 56 07/28/2014 0903     Other studies Reviewed: Additional studies/ records that were reviewed today with results demonstrating: Labs reviewed.   ASSESSMENT AND PLAN:  1. CAD: No angina.  Continue aggressive secondary prevention.  2. Hyperlipidemia: The current medical regimen is effective;  continue present plan and medications.  NMR panel discussed in detail. 3. Vertigo: improved.  4. PVC: rare.  No sx of PAF that he had many years ago.  5. HTN: The current medical regimen is effective;  continue present plan and medications.    Current medicines are reviewed at length with the patient today.  The patient concerns regarding his medicines were addressed.  The following changes have been made:  No change  Labs/ tests ordered today include:  No orders of the defined types were placed in this encounter.   Recommend 150 minutes/week of aerobic exercise Low fat, low carb, high fiber diet recommended  Disposition:   FU in 6 months   Signed, Larae Grooms, MD  02/05/2020 12:03 PM    [facility] [address], [place], [place]  [address] Phone: [phone]; Fax: [phone]

## 2020-02-05 ENCOUNTER — Encounter: Payer: Self-pay | Admitting: Interventional Cardiology

## 2020-02-05 ENCOUNTER — Ambulatory Visit (INDEPENDENT_AMBULATORY_CARE_PROVIDER_SITE_OTHER): Payer: Medicare Other | Admitting: Interventional Cardiology

## 2020-02-05 ENCOUNTER — Other Ambulatory Visit: Payer: Self-pay

## 2020-02-05 VITALS — BP 132/70 | HR 75 | Ht 68.0 in | Wt 173.2 lb

## 2020-02-05 DIAGNOSIS — R7303 Prediabetes: Secondary | ICD-10-CM

## 2020-02-05 DIAGNOSIS — I493 Ventricular premature depolarization: Secondary | ICD-10-CM | POA: Diagnosis not present

## 2020-02-05 DIAGNOSIS — E782 Mixed hyperlipidemia: Secondary | ICD-10-CM | POA: Diagnosis not present

## 2020-02-05 DIAGNOSIS — I480 Paroxysmal atrial fibrillation: Secondary | ICD-10-CM

## 2020-02-05 DIAGNOSIS — I10 Essential (primary) hypertension: Secondary | ICD-10-CM

## 2020-02-05 DIAGNOSIS — I2510 Atherosclerotic heart disease of native coronary artery without angina pectoris: Secondary | ICD-10-CM

## 2020-02-05 NOTE — Patient Instructions (Signed)
Medication Instructions:  Your physician recommends that you continue on your current medications as directed. Please refer to the Current Medication list given to you today.  *If you need a refill on your cardiac medications before your next appointment, please call your pharmacy*   Lab Work: none If you have labs (blood work) drawn today and your tests are completely normal, you will receive your results only by: MyChart Message (if you have MyChart) OR A paper copy in the mail If you have any lab test that is abnormal or we need to change your treatment, we will call you to review the results.   Testing/Procedures: none   Follow-Up: At [facility], you and your health needs are our priority.  As part of our continuing mission to provide you with exceptional heart care, we have created designated Provider Care Teams.  These Care Teams include your primary Cardiologist (physician) and Advanced Practice Providers (APPs -  Physician Assistants and Nurse Practitioners) who all work together to provide you with the care you need, when you need it.  We recommend signing up for the patient portal called "MyChart".  Sign up information is provided on this After Visit Summary.  MyChart is used to connect with patients for Virtual Visits (Telemedicine).  Patients are able to view lab/test results, encounter notes, upcoming appointments, etc.  Non-urgent messages can be sent to your provider as well.   To learn more about what you can do with MyChart, go to [email or web address].    Your next appointment:   6 month(s)  The format for your next appointment:   In Person  Provider:   You may see Jayadeep Varanasi, MD or one of the following Advanced Practice Providers on your designated Care Team:   Dayna Dunn, PA-C Michele Lenze, PA-C   Other Instructions   

## 2020-02-25 DIAGNOSIS — Z125 Encounter for screening for malignant neoplasm of prostate: Secondary | ICD-10-CM

## 2020-02-25 DIAGNOSIS — R350 Frequency of micturition: Secondary | ICD-10-CM

## 2020-02-25 DIAGNOSIS — E782 Mixed hyperlipidemia: Secondary | ICD-10-CM

## 2020-02-25 DIAGNOSIS — I2510 Atherosclerotic heart disease of native coronary artery without angina pectoris: Secondary | ICD-10-CM

## 2020-02-29 ENCOUNTER — Other Ambulatory Visit: Payer: Self-pay

## 2020-02-29 ENCOUNTER — Ambulatory Visit (AMBULATORY_SURGERY_CENTER): Payer: Self-pay | Admitting: *Deleted

## 2020-02-29 VITALS — Ht 68.0 in | Wt 174.0 lb

## 2020-02-29 DIAGNOSIS — Z1211 Encounter for screening for malignant neoplasm of colon: Secondary | ICD-10-CM

## 2020-02-29 NOTE — Progress Notes (Signed)
No egg or soy allergy known to patient  No issues with past sedation with any surgeries or procedures No intubation problems in the past  No FH of Malignant Hyperthermia No diet pills per patient No home 02 use per patient  No blood thinners per patient   Pt states  issues with constipation on and Off- Uses Fiber and Eats increased fiber - not a chronic issue- is OCC only   No A fib or A flutter  EMMI video to pt or via [place] 19 guidelines implemented in PV today with Pt and RN  Pt is fully vaccinated  for Covid    Due to the COVID-19 pandemic we are asking patients to follow certain guidelines.  Pt aware of COVID protocols and [facility] guidelines

## 2020-03-04 DIAGNOSIS — R400 Somnolence: Secondary | ICD-10-CM | POA: Diagnosis not present

## 2020-03-04 DIAGNOSIS — R0981 Nasal congestion: Secondary | ICD-10-CM | POA: Diagnosis not present

## 2020-03-14 ENCOUNTER — Encounter: Payer: Medicare Other | Admitting: Gastroenterology

## 2020-03-15 DIAGNOSIS — M179 Osteoarthritis of knee, unspecified: Secondary | ICD-10-CM | POA: Diagnosis not present

## 2020-03-15 DIAGNOSIS — E782 Mixed hyperlipidemia: Secondary | ICD-10-CM | POA: Diagnosis not present

## 2020-03-15 DIAGNOSIS — I2510 Atherosclerotic heart disease of native coronary artery without angina pectoris: Secondary | ICD-10-CM | POA: Diagnosis not present

## 2020-03-29 ENCOUNTER — Other Ambulatory Visit: Payer: Self-pay | Admitting: Sports Medicine

## 2020-03-29 DIAGNOSIS — M7581 Other shoulder lesions, right shoulder: Secondary | ICD-10-CM | POA: Diagnosis not present

## 2020-03-29 DIAGNOSIS — M25511 Pain in right shoulder: Secondary | ICD-10-CM

## 2020-03-31 ENCOUNTER — Other Ambulatory Visit: Payer: Self-pay | Admitting: Interventional Cardiology

## 2020-04-07 DIAGNOSIS — I2510 Atherosclerotic heart disease of native coronary artery without angina pectoris: Secondary | ICD-10-CM

## 2020-04-07 DIAGNOSIS — I480 Paroxysmal atrial fibrillation: Secondary | ICD-10-CM

## 2020-04-07 NOTE — Telephone Encounter (Signed)
Noah [name] is calling back to see if another appointment time is available for a virtual today due to not yet hearing back. His best callback number is [phone]. Please advise.

## 2020-04-07 NOTE — Telephone Encounter (Signed)
Spoke with the pt and offered him a My Chart visit with Dr. Irish Lack today at 1 pm as per Dr. Hassell Done request but he reports that he is unavailable at 1 pm and asking of he can have another time.. I will forward to Dr. Hassell Done nurse for review.

## 2020-04-07 NOTE — Progress Notes (Signed)
Cardiology Office Note   Date:  04/08/2020   ID:  Noah [name], DOB Dec 27, 1950, MRN [id number]  PCP:  Lawerance Cruel, MD    No chief complaint on file.  CAD/sweating  Wt Readings from Last 3 Encounters:  04/08/20 168 lb 3.2 oz (76.3 kg)  02/29/20 174 lb (78.9 kg)  02/05/20 173 lb 3.2 oz (78.6 kg)       History of Present Illness: Noah [name] is a 70 y.o. male  with coronary artery disease status post CABG in 2007, remote paroxysmal atrial fibrillation (noted many years ago), hyperlipidemia (intolerant of some statins).In 2/19,He has had some palpitations. 48-hour Holter monitor demonstrated normal rhythm, PVCs and PACs. There wasno atrial fibrillation noted.  He was evaluated for palpitations in the office in 9/19. He was under stress. He was taking an extra half of metoprolol with less benefit than usual.   Echocardiogram 06/10/2017 Mild LVH, EF 65-70, normal wall motion, grade 1 diastolic dysfunction, aortic sclerosis without stenosis, trivial AI, mild LAE  48-hour Holter 04/01/2017 Normal sinus rhythm, PACs and PVCs correlate with symptoms; no significant arrhythmias  Nuclear stress test 10/23/2016 EF 67, no ischemia, low risk  Carotid US 04/27/2015 Bilateral ICA 1-39; follow-up as needed  In the past, he has had some flucuating heart rates while exercising. He tolerates riding the bike well.In 2020,he increased his bike duration and decreased carbs. His lipids were improved in 12/2018.  [place] vaccine.  He had issues with loss of balance and a fall in July 2021.He was stepping over a child protective gate at the base of the stairs. He got stressed and went to ER. Troponins were negative. BNP negative. CT head was negative for any trauma.   He was referred to neurology. He also reported some increased HR. EMS has done an ECG and he was told he had NSR with PVCs. Cr 1.07.He has had some episodes where is  resting HR has been high as well.   In 2021, reported: "He continues to have frequent urination and dizziness. He feels that his HR is going up faster with exercise. He is sweating more. No SHOB or chest pain with exercise.   Still has some orthostatic sx when getting up from the floor after doing exercises.   Had an episode where he started tilting to the left when he was walking."  Messaged the office in 2/22 with following "Little background on recent health.  My right shoulder has been extremely painful for about 4 months now. I did not go to my orthopedist or my rehab specialist due to the recent COVID spike.  I was taking about two Advils per day for about 4 months.  the pain has persisted and was affecting my sleep.    I went to Dr Alfonso Ramus on Tuesday 2/8.  He said I needed a cortisone shot of Kenalog which I received that day. Kenalog I read can cause flushing, higher BP readings and excessive sweating .  Since then my shoulder has responded nicely.  However , since the shot I've had higher bp avergaing about 10 - 15 points higher  and flushing on my face mainly.   So last night while doing my exercise I had a extreme sweating episode and my upper chest and back of my neck were soaked.  I continued on because I didn't feel dizzy or had any chest pain for about total of 23 minutes. .  When finished  I cooled down  but my clothes were soaked with perspiration . I had to change them . I'm a bit alarmed because it's the 9 th day after the shot and didn't think this sweating would happen so far from the day of the shot.   Please advise .  I am open to a telephone visit to discuss.  Thanks Noah [name]  My bp this morning was very high at 139 /40 , and HB  was at 87.  My usual upon waking is 110/70 and 68 . Please advise what you."  Specifically regarding the event on the bike, He recalls having had 2 beers a week after the steroid shot.  He had eaten a lot as well after eating out.  He worked out 3  hours after eating a big meal.  SInce that time, he has worked out.  He did 20 minutes yesterday and worked up a sweat.    Past Medical History:  Diagnosis Date  . Allergy    seasonal- dust mites , tree pollen - worse in Spring   . Anxiety   . Arthritis   . Coronary artery disease 07/13/2005   CABG  . DDD (degenerative disc disease)   . DJD (degenerative joint disease)   . Elevated liver enzymes   . GERD (gastroesophageal reflux disease)   . Hemangioma of liver   . Hemorrhoids   . Hyperlipemia   . Hypertension   . PAC (premature atrial contraction)   . Paroxysmal atrial fibrillation (HCC)    1 run of A Fib only- after heart surgery   . Pneumonia   . PVC (premature ventricular contraction)     Past Surgical History:  Procedure Laterality Date  . COLONOSCOPY    . CORONARY ARTERY BYPASS GRAFT  07/13/2005  . INGUINAL HERNIA REPAIR Right 1978, 2000   x 2  . KNEE SURGERY Right   . SEPTOPLASTY       Current Outpatient Medications  Medication Sig Dispense Refill  . aspirin EC 81 MG tablet Take 1 tablet (81 mg total) by mouth daily.    . calcium carbonate (TUMS - DOSED IN MG ELEMENTAL CALCIUM) 500 MG chewable tablet Chew 1 tablet by mouth daily.    . Cholecalciferol (VITAMIN D) 2000 UNITS tablet Take 2,000 Units by mouth daily.    Marland Kitchen co-enzyme Q-10 30 MG capsule Take 30 mg by mouth 3 (three) times daily.    . famotidine (PEPCID) 10 MG tablet Take 10 mg by mouth 2 (two) times daily.    . Flaxseed, Linseed, POWD Take 2 scoop by mouth as directed.    . Ganciclovir (ZIRGAN) 0.15 % GEL 1 drop. As needed for cold sores    . ibuprofen (ADVIL,MOTRIN) 200 MG tablet Take 200 mg by mouth every 6 (six) hours as needed.    Marland Kitchen LORazepam (ATIVAN) 0.5 MG tablet Take 1 tablet by mouth at bedtime as needed for sleep. To help with sleep    . meclizine (ANTIVERT) 25 MG tablet Take 25 mg by mouth 3 (three) times daily as needed for dizziness.    . metoprolol tartrate (LOPRESSOR) 25 MG tablet Take 1/2  -1 tab as needed (Patient taking differently: Take 25 mg by mouth 2 (two) times daily. Take 1/2 -1 tab as needed) 180 tablet 3  . Multiple Vitamin (MULTIVITAMIN WITH MINERALS) TABS Take 1 tablet by mouth daily. Centrum silver    . nitroGLYCERIN (NITROSTAT) 0.4 MG SL tablet Place 1 tablet (0.4 mg total) under the tongue every 5 (five)  minutes as needed for chest pain (3 DOSES MAX). 25 tablet 6  . pramoxine-hydrocortisone (ANALPRAM HC) cream Apply 1 application topically at bedtime as needed (for skin).     . Psyllium (METAMUCIL PO) 1 tablespoons by mouth as needed, for consitpation    . rosuvastatin (CRESTOR) 20 MG tablet TAKE 1 TABLET DAILY        *SANDOZ* 90 tablet 2  . Ubiquinol 100 MG CAPS Take 100 mg by mouth daily.     No current facility-administered medications for this visit.    Allergies:   Cephalexin, Levofloxacin, Clarithromycin, Penicillins, Ciprofloxacin, Doxycycline, Zoster vac recomb adjuvanted, and Lipitor [atorvastatin]    Social History:  The patient  reports that he quit smoking about 46 years ago. His smoking use included cigarettes. He has never used smokeless tobacco. He reports current alcohol use. He reports that he does not use drugs.   Family History:  The patient's family history includes Atrial fibrillation (age of onset: 23) in his mother; Colon polyps in his mother; Coronary artery disease in his father; Heart attack in his father, maternal grandfather, paternal grandfather, and paternal grandmother; Prostate cancer in his maternal uncle; Seizures in his brother.    ROS:  Please see the history of present illness.   Otherwise, review of systems are positive for sweating episode.   All other systems are reviewed and negative.    PHYSICAL EXAM: VS:  BP 138/78   Pulse 79   Ht 5\' 8"  (1.727 m)   Wt 168 lb 3.2 oz (76.3 kg)   SpO2 98%   BMI 25.57 kg/m  , BMI Body mass index is 25.57 kg/m. GEN: Well nourished, well developed, in no acute distress  HEENT: normal   Neck: no JVD, carotid bruits, or masses Cardiac: RRR; no murmurs, rubs, or gallops,no edema  Respiratory:  clear to auscultation bilaterally, normal work of breathing GI: soft, nontender, nondistended, + BS MS: no deformity or atrophy  Skin: warm and dry, no rash Neuro:  Strength and sensation are intact Psych: euthymic mood, full affect   EKG:   The ekg ordered today demonstrates NSR, no ST changes   Recent Labs: 09/15/2019: Hemoglobin 16.3; Platelets 205 01/29/2020: ALT 35; BUN 12; Creatinine, Ser 1.14; Potassium 4.2; Sodium 139   Lipid Panel    Component Value Date/Time   CHOL 116 01/23/2017 1030   CHOL 121 02/09/2016 1206   CHOL 124 07/28/2014 0903   TRIG 108 01/23/2017 1030   TRIG 112 07/28/2014 0903   HDL 46 01/23/2017 1030   HDL 46 07/28/2014 0903   CHOLHDL 2.5 02/09/2016 1206   LDLCALC 53 02/09/2016 1206   LDLCALC 56 07/28/2014 0903     Other studies Reviewed: Additional studies/ records that were reviewed today with results demonstrating: 2021 echo reviewed with the patient .   ASSESSMENT AND PLAN:  1. CAD: No angina.  Sweating has resolved.  Continue aggressive secondary.  If there are any issues as he resumes exercise, will have a low threshold to perform a stress test. 2. Sweating/HTN: Episode of increased sweating with exercise after having steroid shot in shoulder.  BP had been a little higher as well.  BP recheck by me today 118/64.  Did well with workout yesterday.  I would continue regular exercise.  I suspect his sweating episode was multifactorial and related to alcohol consumption earlier that day with a salty meal after recent steroid shot and perhaps causing some volume overload. 3. Hyperlipidemia: The current medical regimen is effective;  continue present plan and medications. 4. PreDM: Whole food plant based diet. He is trying to eat healthy.   Current medicines are reviewed at length with the patient today.  The patient concerns regarding his  medicines were addressed.  The following changes have been made:  No change  Labs/ tests ordered today include:  No orders of the defined types were placed in this encounter.   Recommend 150 minutes/week of aerobic exercise Low fat, low carb, high fiber diet recommended  Disposition:   FU in as scheduled   Signed, Larae Grooms, MD  04/08/2020 8:18 AM    [facility] [address], [place], [place]  [address] Phone: [phone]; Fax: [phone]

## 2020-04-07 NOTE — Telephone Encounter (Signed)
I spoke with patient and scheduled him to see Dr Irish Lack tomorrow at 8 AM in the office

## 2020-04-08 ENCOUNTER — Ambulatory Visit (INDEPENDENT_AMBULATORY_CARE_PROVIDER_SITE_OTHER): Payer: Medicare Other | Admitting: Interventional Cardiology

## 2020-04-08 ENCOUNTER — Encounter: Payer: Self-pay | Admitting: Interventional Cardiology

## 2020-04-08 ENCOUNTER — Other Ambulatory Visit: Payer: Self-pay

## 2020-04-08 VITALS — BP 138/78 | HR 79 | Ht 68.0 in | Wt 168.2 lb

## 2020-04-08 DIAGNOSIS — E782 Mixed hyperlipidemia: Secondary | ICD-10-CM

## 2020-04-08 DIAGNOSIS — I10 Essential (primary) hypertension: Secondary | ICD-10-CM

## 2020-04-08 DIAGNOSIS — I2510 Atherosclerotic heart disease of native coronary artery without angina pectoris: Secondary | ICD-10-CM

## 2020-04-08 DIAGNOSIS — R7303 Prediabetes: Secondary | ICD-10-CM

## 2020-04-08 MED ORDER — ROSUVASTATIN CALCIUM 20 MG PO TABS: ORAL_TABLET | ORAL | 3 refills | Status: DC

## 2020-04-08 MED ORDER — METOPROLOL SUCCINATE ER 25 MG PO TB24: 25.0000 mg | ORAL_TABLET | Freq: Two times a day (BID) | ORAL | 3 refills | Status: DC

## 2020-04-08 NOTE — Patient Instructions (Signed)
Medication Instructions:  Your physician recommends that you continue on your current medications as directed. Please refer to the Current Medication list given to you today.  *If you need a refill on your cardiac medications before your next appointment, please call your pharmacy*   Lab Work: none If you have labs (blood work) drawn today and your tests are completely normal, you will receive your results only by: Marland Kitchen MyChart Message (if you have MyChart) OR . A paper copy in the mail If you have any lab test that is abnormal or we need to change your treatment, we will call you to review the results.   Testing/Procedures: none   Follow-Up: At [facility], you and your health needs are our priority.  As part of our continuing mission to provide you with exceptional heart care, we have created designated Provider Care Teams.  These Care Teams include your primary Cardiologist (physician) and Advanced Practice Providers (APPs -  Physician Assistants and Nurse Practitioners) who all work together to provide you with the care you need, when you need it.  We recommend signing up for the patient portal called "MyChart".  Sign up information is provided on this After Visit Summary.  MyChart is used to connect with patients for Virtual Visits (Telemedicine).  Patients are able to view lab/test results, encounter notes, upcoming appointments, etc.  Non-urgent messages can be sent to your provider as well.   To learn more about what you can do with MyChart, go to [email or web address].    Your next appointment:   Follow up as scheduled in June  The format for your next appointment:   In Person  Provider:   Casandra Doffing, MD   Other Instructions

## 2020-04-11 DIAGNOSIS — M25511 Pain in right shoulder: Secondary | ICD-10-CM | POA: Diagnosis not present

## 2020-04-11 DIAGNOSIS — M542 Cervicalgia: Secondary | ICD-10-CM | POA: Diagnosis not present

## 2020-04-11 NOTE — Addendum Note (Signed)
Addended by: Thompson Grayer on: 04/11/2020 09:14 AM   Modules accepted: Orders

## 2020-04-14 DIAGNOSIS — I2510 Atherosclerotic heart disease of native coronary artery without angina pectoris: Secondary | ICD-10-CM | POA: Diagnosis not present

## 2020-04-14 DIAGNOSIS — E782 Mixed hyperlipidemia: Secondary | ICD-10-CM | POA: Diagnosis not present

## 2020-04-14 DIAGNOSIS — M179 Osteoarthritis of knee, unspecified: Secondary | ICD-10-CM | POA: Diagnosis not present

## 2020-04-14 NOTE — Addendum Note (Signed)
Addended by: Thompson Grayer on: 04/14/2020 06:23 PM   Modules accepted: Orders

## 2020-04-14 NOTE — Telephone Encounter (Signed)
Reviewed with Dr Irish Lack and patient should have exercise myoview

## 2020-04-15 NOTE — Addendum Note (Signed)
Addended by: [name] on: 04/15/2020 12:39 PM   Modules accepted: Orders

## 2020-04-19 ENCOUNTER — Other Ambulatory Visit: Payer: Medicare Other

## 2020-04-19 MED ORDER — METOPROLOL SUCCINATE ER 25 MG PO TB24: 25.0000 mg | ORAL_TABLET | Freq: Two times a day (BID) | ORAL | 3 refills | Status: DC

## 2020-04-21 ENCOUNTER — Telehealth (HOSPITAL_COMMUNITY): Payer: Self-pay | Admitting: *Deleted

## 2020-04-21 NOTE — Telephone Encounter (Signed)
Patient given detailed instructions per Myocardial Perfusion Study Information Sheet for the test on 04/27/20. Patient notified to arrive 15 minutes early and that it is imperative to arrive on time for appointment to keep from having the test rescheduled.  If you need to cancel or reschedule your appointment, please call the office within 24 hours of your appointment. . Patient verbalized understanding. [name]    

## 2020-04-22 DIAGNOSIS — M542 Cervicalgia: Secondary | ICD-10-CM | POA: Diagnosis not present

## 2020-04-22 DIAGNOSIS — M25511 Pain in right shoulder: Secondary | ICD-10-CM | POA: Diagnosis not present

## 2020-04-25 ENCOUNTER — Other Ambulatory Visit (HOSPITAL_COMMUNITY)
Admission: RE | Admit: 2020-04-25 | Discharge: 2020-04-25 | Disposition: A | Discharge status: Home / Self Care | Payer: Medicare Other | Source: Ambulatory Visit | Attending: Interventional Cardiology | Admitting: Interventional Cardiology

## 2020-04-25 DIAGNOSIS — Z01812 Encounter for preprocedural laboratory examination: Secondary | ICD-10-CM | POA: Insufficient documentation

## 2020-04-25 DIAGNOSIS — Z20822 Contact with and (suspected) exposure to covid-19: Secondary | ICD-10-CM | POA: Diagnosis not present

## 2020-04-25 LAB — SARS CORONAVIRUS 2 (TAT 6-24 HRS): SARS Coronavirus 2: NEGATIVE

## 2020-04-27 ENCOUNTER — Ambulatory Visit (HOSPITAL_COMMUNITY): Payer: Medicare Other | Attending: Cardiovascular Disease

## 2020-04-27 ENCOUNTER — Other Ambulatory Visit: Payer: Self-pay

## 2020-04-27 DIAGNOSIS — I2510 Atherosclerotic heart disease of native coronary artery without angina pectoris: Secondary | ICD-10-CM | POA: Insufficient documentation

## 2020-04-27 DIAGNOSIS — M25511 Pain in right shoulder: Secondary | ICD-10-CM | POA: Diagnosis not present

## 2020-04-27 DIAGNOSIS — I480 Paroxysmal atrial fibrillation: Secondary | ICD-10-CM | POA: Insufficient documentation

## 2020-04-27 LAB — MYOCARDIAL PERFUSION IMAGING
Estimated workload: 10.1 METS
Exercise duration (min): 9 min
LV dias vol: 66 mL (ref 62–150)
LV sys vol: 24 mL
MPHR: 149 {beats}/min
Peak HR: 151 {beats}/min
Percent HR: 101 %
Rest HR: 80 {beats}/min
SDS: 0
SRS: 0
SSS: 0
TID: 1.07

## 2020-04-27 MED ORDER — TECHNETIUM TC 99M TETROFOSMIN IV KIT
31.2000 | PACK | Freq: Once | INTRAVENOUS | Status: AC | PRN
  Administered 2020-04-27: 31.2 via INTRAVENOUS
  Filled 2020-04-27: qty 32

## 2020-04-27 MED ORDER — REGADENOSON 0.4 MG/5ML IV SOLN: 0.4000 mg | Freq: Once | INTRAVENOUS | Status: AC

## 2020-04-27 MED ORDER — TECHNETIUM TC 99M TETROFOSMIN IV KIT
10.3000 | PACK | Freq: Once | INTRAVENOUS | Status: AC | PRN
  Administered 2020-04-27: 10.3 via INTRAVENOUS
  Filled 2020-04-27: qty 11

## 2020-04-28 DIAGNOSIS — R400 Somnolence: Secondary | ICD-10-CM | POA: Diagnosis not present

## 2020-04-28 DIAGNOSIS — E782 Mixed hyperlipidemia: Secondary | ICD-10-CM | POA: Diagnosis not present

## 2020-04-28 DIAGNOSIS — R0981 Nasal congestion: Secondary | ICD-10-CM | POA: Diagnosis not present

## 2020-04-28 DIAGNOSIS — Z79899 Other long term (current) drug therapy: Secondary | ICD-10-CM | POA: Diagnosis not present

## 2020-04-28 DIAGNOSIS — Z125 Encounter for screening for malignant neoplasm of prostate: Secondary | ICD-10-CM | POA: Diagnosis not present

## 2020-05-03 DIAGNOSIS — F411 Generalized anxiety disorder: Secondary | ICD-10-CM | POA: Diagnosis not present

## 2020-05-03 DIAGNOSIS — J309 Allergic rhinitis, unspecified: Secondary | ICD-10-CM | POA: Diagnosis not present

## 2020-05-03 DIAGNOSIS — Z0000 Encounter for general adult medical examination without abnormal findings: Secondary | ICD-10-CM | POA: Diagnosis not present

## 2020-05-03 DIAGNOSIS — J029 Acute pharyngitis, unspecified: Secondary | ICD-10-CM | POA: Diagnosis not present

## 2020-05-04 ENCOUNTER — Telehealth: Payer: Self-pay | Admitting: Gastroenterology

## 2020-05-04 NOTE — Telephone Encounter (Addendum)
Spoke with patient to inform will need an office visit, but he declined stating he does not want to reschedule to another day.  Appt kept for 05/23/20 for colon only.

## 2020-05-04 NOTE — Telephone Encounter (Signed)
Inbound call from patient he went to see his PCP due to some issues he is having and they advised him to go ahead and do an EGD with his colonoscopy if possible.  Is it ok to schedule or would he need an office visit first?  Please advise.

## 2020-05-04 NOTE — Telephone Encounter (Signed)
He will need an office visit for the EGD.  Can you please set him up with Dr Ardis Hughs.  He can cancel the colon if he wants to wait and see what Dr Ardis Hughs wants to do.

## 2020-05-05 DIAGNOSIS — M542 Cervicalgia: Secondary | ICD-10-CM | POA: Diagnosis not present

## 2020-05-05 DIAGNOSIS — M25511 Pain in right shoulder: Secondary | ICD-10-CM | POA: Diagnosis not present

## 2020-05-12 DIAGNOSIS — M542 Cervicalgia: Secondary | ICD-10-CM | POA: Diagnosis not present

## 2020-05-12 DIAGNOSIS — M25511 Pain in right shoulder: Secondary | ICD-10-CM | POA: Diagnosis not present

## 2020-05-17 ENCOUNTER — Ambulatory Visit
Admission: RE | Admit: 2020-05-17 | Discharge: 2020-05-17 | Disposition: A | Discharge status: Home / Self Care | Payer: Medicare Other | Source: Ambulatory Visit | Attending: Sports Medicine | Admitting: Sports Medicine

## 2020-05-17 ENCOUNTER — Other Ambulatory Visit: Payer: Self-pay

## 2020-05-17 DIAGNOSIS — M25511 Pain in right shoulder: Secondary | ICD-10-CM | POA: Diagnosis not present

## 2020-05-18 DIAGNOSIS — Z951 Presence of aortocoronary bypass graft: Secondary | ICD-10-CM

## 2020-05-18 DIAGNOSIS — R0789 Other chest pain: Secondary | ICD-10-CM

## 2020-05-20 HISTORY — PX: COLONOSCOPY: SHX174

## 2020-05-22 ENCOUNTER — Telehealth: Payer: Self-pay | Admitting: Gastroenterology

## 2020-05-22 NOTE — Telephone Encounter (Signed)
Pt calling with substantial diarrhea and mild dizziness following first dose of bowel prep. He states he's been in the bathroom for over an hour with ongoing diarrhea. He relates his diarrhea is completely clear, like water, and it is just flowing out.  Advised to push clear liquids tonight, other than just water, and do not take the second dose of the bowel prep in am. If dizziness does not resolve call back or go to ED, otherwise come in for colonoscopy as scheduled.

## 2020-05-23 ENCOUNTER — Other Ambulatory Visit: Payer: Self-pay

## 2020-05-23 ENCOUNTER — Encounter: Payer: Self-pay | Admitting: Gastroenterology

## 2020-05-23 ENCOUNTER — Ambulatory Visit (AMBULATORY_SURGERY_CENTER): Payer: Medicare Other | Admitting: Gastroenterology

## 2020-05-23 VITALS — BP 105/58 | HR 70 | Temp 98.1°F | Resp 20 | Ht 68.0 in | Wt 174.0 lb

## 2020-05-23 DIAGNOSIS — Z1211 Encounter for screening for malignant neoplasm of colon: Secondary | ICD-10-CM | POA: Diagnosis not present

## 2020-05-23 DIAGNOSIS — K5289 Other specified noninfective gastroenteritis and colitis: Secondary | ICD-10-CM | POA: Diagnosis not present

## 2020-05-23 MED ORDER — SODIUM CHLORIDE 0.9 % IV SOLN: 500.0000 mL | Freq: Once | INTRAVENOUS | Status: DC

## 2020-05-23 NOTE — Progress Notes (Signed)
pt tolerated well. VSS. awake and to recovery. Report given to RN.  

## 2020-05-23 NOTE — Op Note (Signed)
[facility] Patient Name: Noah [name] Procedure Date: 05/23/2020 7:31 AM MRN: [id number] Endoscopist: Milus Banister , MD Age: 70 Referring MD:  Date of Birth: March 28, 1950 Gender: Male Account #: [id number] Procedure:                Colonoscopy Indications:              Screening for colorectal malignant neoplasm Medicines:                Monitored Anesthesia Care Procedure:                Pre-Anesthesia Assessment:                           - Prior to the procedure, a History and Physical                            was performed, and patient medications and                            allergies were reviewed. The patient's tolerance of                            previous anesthesia was also reviewed. The risks                            and benefits of the procedure and the sedation                            options and risks were discussed with the patient.                            All questions were answered, and informed consent                            was obtained. Prior Anticoagulants: The patient has                            taken no previous anticoagulant or antiplatelet                            agents. ASA Grade Assessment: II - A patient with                            mild systemic disease. After reviewing the risks                            and benefits, the patient was deemed in                            satisfactory condition to undergo the procedure.                           After obtaining informed consent, the colonoscope  was passed under direct vision. Throughout the                            procedure, the patient's blood pressure, pulse, and                            oxygen saturations were monitored continuously. The                            Colonoscope was introduced through the anus and                            advanced to the the cecum, identified by                            appendiceal orifice and  ileocecal valve. The                            colonoscopy was performed without difficulty. The                            patient tolerated the procedure well. The quality                            of the bowel preparation was good. The ileocecal                            valve, appendiceal orifice, and rectum were                            photographed. Scope In: 8:04:19 AM Scope Out: 8:18:53 AM Scope Withdrawal Time: 0 hours 11 minutes 19 seconds  Total Procedure Duration: 0 hours 14 minutes 34 seconds  Findings:                 Inflammation, mild in severity and characterized by                            erosions and erythema was found in the terminal                            ileum. Biopsies were taken with a cold forceps for                            histology. jar 1                           A few small, scattered erosions were noted                            throughout the colon. Biopsies randomly taken from                            right colon (jar 2) and left colon Enrique Sack).  Multiple small and large-mouthed diverticula were                            found in the left colon.                           External and internal hemorrhoids were found. Small                            internal, large external.                           The exam was otherwise without abnormality on                            direct and retroflexion views. Complications:            No immediate complications. Estimated blood loss:                            None. Estimated Blood Loss:     Estimated blood loss: none. Impression:               - Ileitis. Biopsied.                           - A few small, scattered erosions were noted                            throughout the colon. Biopsies randomly taken from                            right colon (jar 2) and left colon Enrique Sack).                           - Diverticulosis in the left colon.                           -  External and internal hemorrhoids.                           - The examination was otherwise normal on direct                            and retroflexion views. Recommendation:           - Patient has a contact number available for                            emergencies. The signs and symptoms of potential                            delayed complications were discussed with the                            patient. Return to normal activities tomorrow.  Written discharge instructions were provided to the                            patient.                           - Resume previous diet.                           - Continue present medications.                           - Await pathology results. Will keep in mind his                            particularly abrupt, dramatic response to the prep                            last night when interpreting the path results. Milus Banister, MD 05/23/2020 8:23:44 AM This report has been signed electronically.

## 2020-05-23 NOTE — Progress Notes (Signed)
Pt's states no medical or surgical changes since previsit or office visit. MRI torn rotator cuff.

## 2020-05-23 NOTE — Progress Notes (Signed)
Called to room to assist during endoscopic procedure.  Patient ID and intended procedure confirmed with present staff. Received instructions for my participation in the procedure from the performing physician.  

## 2020-05-23 NOTE — Patient Instructions (Signed)
YOU HAD AN ENDOSCOPIC PROCEDURE TODAY AT THE [facility]:   Refer to the procedure report that was given to you for any specific questions about what was found during the examination.  If the procedure report does not answer your questions, please call your gastroenterologist to clarify.  If you requested that your care partner not be given the details of your procedure findings, then the procedure report has been included in a sealed envelope for you to review at your convenience later.  YOU SHOULD EXPECT: Some feelings of bloating in the abdomen. Passage of more gas than usual.  Walking can help get rid of the air that was put into your GI tract during the procedure and reduce the bloating. If you had a lower endoscopy (such as a colonoscopy or flexible sigmoidoscopy) you may notice spotting of blood in your stool or on the toilet paper. If you underwent a bowel prep for your procedure, you may not have a normal bowel movement for a few days.  Please Note:  You might notice some irritation and congestion in your nose or some drainage.  This is from the oxygen used during your procedure.  There is no need for concern and it should clear up in a day or so.  SYMPTOMS TO REPORT IMMEDIATELY:   Following lower endoscopy (colonoscopy or flexible sigmoidoscopy):  Excessive amounts of blood in the stool  Significant tenderness or worsening of abdominal pains  Swelling of the abdomen that is new, acute  Fever of 100F or higher   Following upper endoscopy (EGD)  Vomiting of blood or coffee ground material  New chest pain or pain under the shoulder blades  Painful or persistently difficult swallowing  New shortness of breath  Fever of 100F or higher  Black, tarry-looking stools  For urgent or emergent issues, a gastroenterologist can be reached at any hour by calling [phone]. Do not use MyChart messaging for urgent concerns.    DIET:  We do recommend a small meal at first, but  then you may proceed to your regular diet.  Drink plenty of fluids but you should avoid alcoholic beverages for 24 hours.  ACTIVITY:  You should plan to take it easy for the rest of today and you should NOT DRIVE or use heavy machinery until tomorrow (because of the sedation medicines used during the test).    FOLLOW UP: Our staff will call the number listed on your records 48-72 hours following your procedure to check on you and address any questions or concerns that you may have regarding the information given to you following your procedure. If we do not reach you, we will leave a message.  We will attempt to reach you two times.  During this call, we will ask if you have developed any symptoms of COVID 19. If you develop any symptoms (ie: fever, flu-like symptoms, shortness of breath, cough etc.) before then, please call [phone].  If you test positive for Covid 19 in the 2 weeks post procedure, please call and report this information to us.    If any biopsies were taken you will be contacted by phone or by letter within the next 1-3 weeks.  Please call us at [phone] if you have not heard about the biopsies in 3 weeks.    SIGNATURES/CONFIDENTIALITY: You and/or your care partner have signed paperwork which will be entered into your electronic medical record.  These signatures attest to the fact that that the information above on   your After Visit Summary has been reviewed and is understood.  Full responsibility of the confidentiality of this discharge information lies with you and/or your care-partner. 

## 2020-05-25 ENCOUNTER — Telehealth: Payer: Self-pay | Admitting: *Deleted

## 2020-05-25 DIAGNOSIS — S46011A Strain of muscle(s) and tendon(s) of the rotator cuff of right shoulder, initial encounter: Secondary | ICD-10-CM | POA: Diagnosis not present

## 2020-05-25 NOTE — Telephone Encounter (Signed)
  Follow up Call-  Call back number 05/23/2020  Post procedure Call Back phone  # [phone]  Permission to leave phone message Yes  Some recent data might be hidden     Patient questions:  Do you have a fever, pain , or abdominal swelling? No. Pain Score  0 *  Have you tolerated food without any problems? Yes.    Have you been able to return to your normal activities? Yes.    Do you have any questions about your discharge instructions: Diet   No. Medications  No. Follow up visit  No.  Do you have questions or concerns about your Care? No.  Actions: * If pain score is 4 or above: No action needed, pain <4.  1. Have you developed a fever since your procedure? NO  2.   Have you had an respiratory symptoms (SOB or cough) since your procedure? NO  3.   Have you tested positive for COVID 19 since your procedure NO  4.   Have you had any family members/close contacts diagnosed with the COVID 19 since your procedure?  NO   If yes to any of these questions please route to Joylene John, RN and Joella Prince, RN

## 2020-05-26 DIAGNOSIS — E782 Mixed hyperlipidemia: Secondary | ICD-10-CM | POA: Diagnosis not present

## 2020-05-26 DIAGNOSIS — I2510 Atherosclerotic heart disease of native coronary artery without angina pectoris: Secondary | ICD-10-CM | POA: Diagnosis not present

## 2020-05-26 DIAGNOSIS — M179 Osteoarthritis of knee, unspecified: Secondary | ICD-10-CM | POA: Diagnosis not present

## 2020-05-27 DIAGNOSIS — M542 Cervicalgia: Secondary | ICD-10-CM | POA: Diagnosis not present

## 2020-05-27 DIAGNOSIS — M25511 Pain in right shoulder: Secondary | ICD-10-CM | POA: Diagnosis not present

## 2020-05-31 DIAGNOSIS — H35371 Puckering of macula, right eye: Secondary | ICD-10-CM | POA: Diagnosis not present

## 2020-06-10 DIAGNOSIS — M542 Cervicalgia: Secondary | ICD-10-CM | POA: Diagnosis not present

## 2020-06-10 DIAGNOSIS — M25511 Pain in right shoulder: Secondary | ICD-10-CM | POA: Diagnosis not present

## 2020-06-17 DIAGNOSIS — M25511 Pain in right shoulder: Secondary | ICD-10-CM | POA: Diagnosis not present

## 2020-06-17 DIAGNOSIS — M542 Cervicalgia: Secondary | ICD-10-CM | POA: Diagnosis not present

## 2020-06-23 ENCOUNTER — Telehealth: Payer: Self-pay | Admitting: *Deleted

## 2020-06-23 NOTE — Telephone Encounter (Signed)
   [facility] Pre-operative Risk Assessment    Patient Name: Noah [name]  DOB: [birth year]  MRN: [id number]   HEARTCARE STAFF: - Please ensure there is not already an duplicate clearance open for this procedure. - Under Visit Info/Reason for Call, type in Other and utilize the format Clearance MM/DD/YY or Clearance TBD. Do not use dashes or single digits. - If request is for dental extraction, please clarify the # of teeth to be extracted.  Request for surgical clearance:  1. What type of surgery is being performed? RIGHT SHOULDER SCOPE, ROTATOR CUFF REPAIR   2. When is this surgery scheduled? TBD   3. What type of clearance is required (medical clearance vs. Pharmacy clearance to hold med vs. Both)? MEDICAL  4. Are there any medications that need to be held prior to surgery and how long? ASA    5. Practice name and name of physician performing surgery? [facility]; DR. Vonna Kotyk LANDAU   6. What is the office phone number? [phone]   7.   What is the office fax number? [place].   Anesthesia type (None, local, MAC, general) ? CHOICE   [name] 06/23/2020, 4:17 PM  _________________________________________________________________   (provider comments below)

## 2020-06-24 DIAGNOSIS — E782 Mixed hyperlipidemia: Secondary | ICD-10-CM

## 2020-06-24 DIAGNOSIS — M791 Myalgia, unspecified site: Secondary | ICD-10-CM

## 2020-06-24 NOTE — Telephone Encounter (Signed)
   Name: Noah [name]  DOB: [birth year]  MRN: [id number]   Primary Cardiologist: Larae Grooms, MD  Chart reviewed as part of pre-operative protocol coverage. Patient was contacted 06/24/2020 in reference to pre-operative risk assessment for pending surgery as outlined below.  Noah [name] was last seen on 04/08/20 by Dr. Irish Lack.  Since that day, Noah [name] has done well from a cardiac standpoint. He exercises 55min-1 hour daily without anginal complaints. Recently underwent a NST 04/2020 which was without ischemia.  Therefore, based on ACC/AHA guidelines, the patient would be at acceptable risk for the planned procedure without further cardiovascular testing.   The patient was advised that if he develops new symptoms prior to surgery to contact our office to arrange for a follow-up visit, and he verbalized understanding.  Favor continuing aspirin throughout the perioperative period.   I will route this recommendation to the requesting party via Epic fax function and remove from pre-op pool. Please call with questions.  Abigail Butts, PA-C 06/24/2020, 11:19 AM

## 2020-07-06 DIAGNOSIS — M25511 Pain in right shoulder: Secondary | ICD-10-CM | POA: Diagnosis not present

## 2020-07-06 DIAGNOSIS — M542 Cervicalgia: Secondary | ICD-10-CM | POA: Diagnosis not present

## 2020-07-20 DIAGNOSIS — M542 Cervicalgia: Secondary | ICD-10-CM | POA: Diagnosis not present

## 2020-07-20 DIAGNOSIS — M25511 Pain in right shoulder: Secondary | ICD-10-CM | POA: Diagnosis not present

## 2020-07-26 DIAGNOSIS — E782 Mixed hyperlipidemia: Secondary | ICD-10-CM | POA: Diagnosis not present

## 2020-07-26 DIAGNOSIS — I2510 Atherosclerotic heart disease of native coronary artery without angina pectoris: Secondary | ICD-10-CM | POA: Diagnosis not present

## 2020-07-26 DIAGNOSIS — M179 Osteoarthritis of knee, unspecified: Secondary | ICD-10-CM | POA: Diagnosis not present

## 2020-07-29 ENCOUNTER — Other Ambulatory Visit: Payer: Self-pay

## 2020-07-29 ENCOUNTER — Other Ambulatory Visit: Payer: Medicare Other | Admitting: *Deleted

## 2020-07-29 DIAGNOSIS — E782 Mixed hyperlipidemia: Secondary | ICD-10-CM | POA: Diagnosis not present

## 2020-07-29 DIAGNOSIS — M791 Myalgia, unspecified site: Secondary | ICD-10-CM

## 2020-07-29 DIAGNOSIS — R350 Frequency of micturition: Secondary | ICD-10-CM | POA: Diagnosis not present

## 2020-07-29 DIAGNOSIS — M542 Cervicalgia: Secondary | ICD-10-CM | POA: Diagnosis not present

## 2020-07-29 DIAGNOSIS — Z125 Encounter for screening for malignant neoplasm of prostate: Secondary | ICD-10-CM | POA: Diagnosis not present

## 2020-07-29 DIAGNOSIS — I2510 Atherosclerotic heart disease of native coronary artery without angina pectoris: Secondary | ICD-10-CM | POA: Diagnosis not present

## 2020-07-29 DIAGNOSIS — M25511 Pain in right shoulder: Secondary | ICD-10-CM | POA: Diagnosis not present

## 2020-07-30 LAB — COMPREHENSIVE METABOLIC PANEL
ALT: 38 IU/L (ref 0–44)
AST: 28 IU/L (ref 0–40)
Albumin/Globulin Ratio: 1.7 (ref 1.2–2.2)
Albumin: 4.6 g/dL (ref 3.7–4.7)
Alkaline Phosphatase: 90 IU/L (ref 44–121)
BUN/Creatinine Ratio: 10 (ref 10–24)
BUN: 11 mg/dL (ref 8–27)
Bilirubin Total: 0.8 mg/dL (ref 0.0–1.2)
CO2: 23 mmol/L (ref 20–29)
Calcium: 9.6 mg/dL (ref 8.6–10.2)
Chloride: 100 mmol/L (ref 96–106)
Creatinine, Ser: 1.1 mg/dL (ref 0.76–1.27)
Globulin, Total: 2.7 g/dL (ref 1.5–4.5)
Glucose: 93 mg/dL (ref 65–99)
Potassium: 4.1 mmol/L (ref 3.5–5.2)
Sodium: 138 mmol/L (ref 134–144)
Total Protein: 7.3 g/dL (ref 6.0–8.5)
eGFR: 72 mL/min/{1.73_m2} (ref >59)

## 2020-07-30 LAB — PSA: Prostate Specific Ag, Serum: 0.5 ng/mL (ref 0.0–4.0)

## 2020-07-30 LAB — NMR, LIPOPROFILE
Cholesterol, Total: 129 mg/dL (ref 100–199)
HDL Particle Number: 38.8 umol/L (ref >=30.5)
HDL-C: 53 mg/dL (ref >39)
LDL Particle Number: 812 nmol/L (ref <1000)
LDL Size: 20.3 nm — ABNORMAL LOW (ref >20.5)
LDL-C (NIH Calc): 52 mg/dL (ref 0–99)
LP-IR Score: 30 (ref <=45)
Small LDL Particle Number: 477 nmol/L (ref <=527)
Triglycerides: 142 mg/dL (ref 0–149)

## 2020-07-30 LAB — APOLIPOPROTEIN B: Apolipoprotein B: 77 mg/dL (ref <90)

## 2020-08-05 ENCOUNTER — Ambulatory Visit: Payer: Medicare Other | Admitting: Interventional Cardiology

## 2020-08-10 DIAGNOSIS — L821 Other seborrheic keratosis: Secondary | ICD-10-CM | POA: Diagnosis not present

## 2020-08-10 DIAGNOSIS — L570 Actinic keratosis: Secondary | ICD-10-CM | POA: Diagnosis not present

## 2020-08-10 DIAGNOSIS — S20361S Insect bite (nonvenomous) of right front wall of thorax, sequela: Secondary | ICD-10-CM | POA: Diagnosis not present

## 2020-08-11 DIAGNOSIS — M542 Cervicalgia: Secondary | ICD-10-CM | POA: Diagnosis not present

## 2020-08-11 DIAGNOSIS — M25511 Pain in right shoulder: Secondary | ICD-10-CM | POA: Diagnosis not present

## 2020-08-14 NOTE — Progress Notes (Signed)
Cardiology Office Note   Date:  08/15/2020   ID:  Noah [name], DOB 1950/07/28, MRN [id number]  PCP:  Lawerance Cruel, MD    No chief complaint on file.  CAD  Wt Readings from Last 3 Encounters:  08/15/20 173 lb (78.5 kg)  05/23/20 174 lb (78.9 kg)  04/27/20 168 lb (76.2 kg)       History of Present Illness: Noah [name] is a 69 y.o. male  with coronary artery disease status post CABG in 2007, remote paroxysmal atrial fibrillation (noted many years ago), hyperlipidemia (intolerant of some statins).   In 2/19,  He has had some palpitations.  48-hour Holter monitor demonstrated normal rhythm, PVCs and PACs.  There was no atrial fibrillation noted.    He was evaluated for palpitations in the office in   9/19. He was under stress.  He was taking an extra half of metoprolol with less benefit than usual.   Echocardiogram 06/10/2017 Mild LVH, EF 65-70, normal wall motion, grade 1 diastolic dysfunction, aortic sclerosis without stenosis, trivial AI, mild LAE   48-hour Holter 04/01/2017 Normal sinus rhythm, PACs and PVCs correlate with symptoms; no significant arrhythmias   Nuclear stress test 10/23/2016 EF 67, no ischemia, low risk   Carotid US 04/27/2015 Bilateral ICA 1-39; follow-up as needed   In the past, he has had some flucuating heart rates while exercising.  He tolerates riding the bike well.  In 2020, he increased his bike duration and decreased carbs.  His lipids were improved in 12/2018.   [place] vaccine.   He had issues with loss of balance and a fall in July 2021.  He was stepping over a child protective gate at the base of the stairs.  He got stressed and went to ER. Troponins were negative. BNP negative.  CT head was negative for any trauma.     He was referred to neurology.  He also reported some increased HR.  EMS has done an ECG and he was told he had NSR with PVCs.  Cr 1.07.  He has had some episodes where is resting HR has been high as  well.   In 2021, reported: "He continues to have frequent urination and dizziness.  He feels that his HR is going up faster with exercise.  He is sweating more. No SHOB or chest pain with exercise.   Still has some orthostatic sx when getting up from the floor after doing exercises.   Had an episode where he started tilting to the left when he was walking."    In 5/22, he wanted a CK drawn after sending the following: " I want to see if the problems with both shoulders is due to the statins I've been taking.  It's a situation that I think started just about when I switched from Lipitor to  Crestor back around 2013/2014.  So my thought is maybe it is the Crestor because I did not have any injuries from falls or sports or anything  for that matter."  Denies : Chest pain. Dizziness. Leg edema. Nitroglycerin use. Orthopnea. Palpitations. Paroxysmal nocturnal dyspnea. Shortness of breath. Syncope.    Shoulder pain.  Riding the exercise bike 30-50 minutes 5-6 x/week.    Rare advil use.      Past Medical History:  Diagnosis Date   Allergy    seasonal- dust mites , tree pollen - worse in Spring    Anxiety    Arthritis  Coronary artery disease 07/13/2005   CABG   DDD (degenerative disc disease)    DJD (degenerative joint disease)    Elevated liver enzymes    GERD (gastroesophageal reflux disease)    Hemangioma of liver    Hemorrhoids    Hyperlipemia    Hypertension    PAC (premature atrial contraction)    Paroxysmal atrial fibrillation (HCC)    1 run of A Fib only- after heart surgery    Pneumonia    PVC (premature ventricular contraction)     Past Surgical History:  Procedure Laterality Date   COLONOSCOPY     CORONARY ARTERY BYPASS GRAFT  07/13/2005   INGUINAL HERNIA REPAIR Right 1978, 2000   x 2   KNEE SURGERY Right    SEPTOPLASTY       Current Outpatient Medications  Medication Sig Dispense Refill   aspirin EC 81 MG tablet Take 1 tablet (81 mg total) by mouth daily.      calcium carbonate (TUMS - DOSED IN MG ELEMENTAL CALCIUM) 500 MG chewable tablet Chew 1 tablet by mouth daily as needed.     Cholecalciferol (VITAMIN D) 2000 UNITS tablet Take 2,000 Units by mouth daily.     famotidine (PEPCID) 10 MG tablet Take 10 mg by mouth daily as needed.     Flaxseed, Linseed, POWD Take 2 scoop by mouth as directed.     Ganciclovir (ZIRGAN) 0.15 % GEL 1 drop. As needed for cold sores     ibuprofen (ADVIL,MOTRIN) 200 MG tablet Take 200 mg by mouth every 6 (six) hours as needed.     LORazepam (ATIVAN) 0.5 MG tablet Take 1 tablet by mouth at bedtime as needed for sleep. To help with sleep     meclizine (ANTIVERT) 25 MG tablet Take 25 mg by mouth 3 (three) times daily as needed for dizziness.     metoprolol succinate (TOPROL-XL) 25 MG 24 hr tablet Take 1 tablet (25 mg total) by mouth 2 (two) times daily. 180 tablet 3   metoprolol tartrate (LOPRESSOR) 25 MG tablet Take 1/2 -1 tab as needed 180 tablet 3   Multiple Vitamin (MULTIVITAMIN WITH MINERALS) TABS Take 1 tablet by mouth daily. Centrum silver     nitroGLYCERIN (NITROSTAT) 0.4 MG SL tablet Place 1 tablet (0.4 mg total) under the tongue every 5 (five) minutes as needed for chest pain (3 DOSES MAX). 25 tablet 6   pramoxine-hydrocortisone (ANALPRAM HC) cream Apply 1 application topically at bedtime as needed (for skin).      Psyllium (METAMUCIL PO) 1 tablespoons by mouth as needed, for consitpation     rosuvastatin (CRESTOR) 20 MG tablet TAKE 1 TABLET DAILY        *SANDOZ* 90 tablet 3   Ubiquinol 100 MG CAPS Take 100 mg by mouth daily.     No current facility-administered medications for this visit.   Facility-Administered Medications Ordered in Other Visits  Medication Dose Route Frequency Provider Last Rate Last Admin   regadenoson (LEXISCAN) injection SOLN 0.4 mg  0.4 mg Intravenous Once Nahser, Wonda Cheng, MD        Allergies:   Cephalexin, Levofloxacin, Clarithromycin, Penicillins, Ciprofloxacin, Doxycycline, Zoster  vac recomb adjuvanted, and Lipitor [atorvastatin]    Social History:  The patient  reports that he quit smoking about 46 years ago. His smoking use included cigarettes. He has never used smokeless tobacco. He reports current alcohol use. He reports that he does not use drugs.   Family History:  The patient's family  history includes Atrial fibrillation (age of onset: 67) in his mother; Colon polyps in his mother; Coronary artery disease in his father; Heart attack in his father, maternal grandfather, paternal grandfather, and paternal grandmother; Prostate cancer in his maternal uncle; Seizures in his brother.    ROS:  Please see the history of present illness.   Otherwise, review of systems are positive for shoulder pain.   All other systems are reviewed and negative.    PHYSICAL EXAM: VS:  BP 140/80   Pulse 74   Ht 5\' 8"  (1.727 m)   Wt 173 lb (78.5 kg)   SpO2 98%   BMI 26.30 kg/m  , BMI Body mass index is 26.3 kg/m. GEN: Well nourished, well developed, in no acute distress HEENT: normal Neck: no JVD, carotid bruits, or masses Cardiac: RRR; no murmurs, rubs, or gallops,no edema  Respiratory:  clear to auscultation bilaterally, normal work of breathing GI: soft, nontender, nondistended, + BS MS: no deformity or atrophy Skin: warm and dry, no rash Neuro:  Strength and sensation are intact Psych: euthymic mood, full affect   EKG:   The ekg ordered today demonstrates NSR, no ST changes   Recent Labs: 09/15/2019: Hemoglobin 16.3; Platelets 205 07/29/2020: ALT 38; BUN 11; Creatinine, Ser 1.10; Potassium 4.1; Sodium 138   Lipid Panel    Component Value Date/Time   CHOL 116 01/23/2017 1030   CHOL 121 02/09/2016 1206   CHOL 124 07/28/2014 0903   TRIG 108 01/23/2017 1030   TRIG 112 07/28/2014 0903   HDL 46 01/23/2017 1030   HDL 46 07/28/2014 0903   CHOLHDL 2.5 02/09/2016 1206   LDLCALC 53 02/09/2016 1206   LDLCALC 56 07/28/2014 0903     Other studies Reviewed: Additional  studies/ records that were reviewed today with results demonstrating: labs reviewed. 2021 ECG reviewed; 2019 abdominal u/s with no AAA.  Minimal carotid atherosclerosis on Doppler.    ASSESSMENT AND PLAN:  CAD: s/p CABG in 2007.  Continue aggressive secondary prevention.  No angina.  He lives a very healthy lifestyle. HTN: Low-salt diet.  Avoid processed foods.  He has noted high blood pressures when he is on vacation and eats more salt than usual.  BP 116/60 on my recheck.   Hyperlipidemia: Followed by lipid clinic.  High-fiber intake.  More plant-based diet recommended.  Particle number slightly increased in June 2022.  LDL 52.  Continue high dose statin.  PAF: Very short lived many years ago.  No palpitations.  PreDM: Noted in the past, but resolved. Whole food, plant-based diet recommended.  He is very good about exercise.  A1C 5.3.  Shoulder pain- will check CK as he was concerned about muscle pain fro statins.    Current medicines are reviewed at length with the patient today.  The patient concerns regarding his medicines were addressed.  The following changes have been made:  No change  Labs/ tests ordered today include:  No orders of the defined types were placed in this encounter.   Recommend 150 minutes/week of aerobic exercise Low fat, low carb, high fiber diet recommended  Disposition:   FU in 6 months   Signed, Larae Grooms, MD  08/15/2020 9:25 AM    [facility] [address], [place], [place]  [address] Phone: [phone]; Fax: [phone]

## 2020-08-15 ENCOUNTER — Ambulatory Visit (INDEPENDENT_AMBULATORY_CARE_PROVIDER_SITE_OTHER): Payer: Medicare Other | Admitting: Interventional Cardiology

## 2020-08-15 ENCOUNTER — Encounter: Payer: Self-pay | Admitting: Interventional Cardiology

## 2020-08-15 ENCOUNTER — Other Ambulatory Visit: Payer: Self-pay

## 2020-08-15 VITALS — BP 140/80 | HR 74 | Ht 68.0 in | Wt 173.0 lb

## 2020-08-15 DIAGNOSIS — Z951 Presence of aortocoronary bypass graft: Secondary | ICD-10-CM | POA: Diagnosis not present

## 2020-08-15 DIAGNOSIS — M791 Myalgia, unspecified site: Secondary | ICD-10-CM

## 2020-08-15 DIAGNOSIS — I25118 Atherosclerotic heart disease of native coronary artery with other forms of angina pectoris: Secondary | ICD-10-CM

## 2020-08-15 DIAGNOSIS — I10 Essential (primary) hypertension: Secondary | ICD-10-CM | POA: Diagnosis not present

## 2020-08-15 DIAGNOSIS — I2510 Atherosclerotic heart disease of native coronary artery without angina pectoris: Secondary | ICD-10-CM | POA: Diagnosis not present

## 2020-08-15 DIAGNOSIS — I480 Paroxysmal atrial fibrillation: Secondary | ICD-10-CM

## 2020-08-15 DIAGNOSIS — R7303 Prediabetes: Secondary | ICD-10-CM

## 2020-08-15 DIAGNOSIS — E782 Mixed hyperlipidemia: Secondary | ICD-10-CM

## 2020-08-15 LAB — CK: Total CK: 95 U/L (ref 41–331)

## 2020-08-15 NOTE — Patient Instructions (Signed)
Medication Instructions:  Your physician recommends that you continue on your current medications as directed. Please refer to the Current Medication list given to you today.  *If you need a refill on your cardiac medications before your next appointment, please call your pharmacy*   Lab Work: Lab work to be done today--CK If you have labs (blood work) drawn today and your tests are completely normal, you will receive your results only by: [place] (if you have MyChart) OR A paper copy in the mail If you have any lab test that is abnormal or we need to change your treatment, we will call you to review the results.   Testing/Procedures: none   Follow-Up: At [facility], you and your health needs are our priority.  As part of our continuing mission to provide you with exceptional heart care, we have created designated Provider Care Teams.  These Care Teams include your primary Cardiologist (physician) and Advanced Practice Providers (APPs -  Physician Assistants and Nurse Practitioners) who all work together to provide you with the care you need, when you need it.  We recommend signing up for the patient portal called "MyChart".  Sign up information is provided on this After Visit Summary.  MyChart is used to connect with patients for Virtual Visits (Telemedicine).  Patients are able to view lab/test results, encounter notes, upcoming appointments, etc.  Non-urgent messages can be sent to your provider as well.   To learn more about what you can do with MyChart, go to [email or web address].    Your next appointment:   6 month(s)  The format for your next appointment:   In Person  Provider:   You may see Larae Grooms, MD or one of the following Advanced Practice Providers on your designated Care Team:   Melina Copa, PA-C Ermalinda Barrios, PA-C   Other Instructions

## 2020-08-24 DIAGNOSIS — Z1152 Encounter for screening for COVID-19: Secondary | ICD-10-CM | POA: Diagnosis not present

## 2020-08-25 DIAGNOSIS — M25511 Pain in right shoulder: Secondary | ICD-10-CM | POA: Diagnosis not present

## 2020-08-25 DIAGNOSIS — M542 Cervicalgia: Secondary | ICD-10-CM | POA: Diagnosis not present

## 2020-09-09 DIAGNOSIS — M25511 Pain in right shoulder: Secondary | ICD-10-CM | POA: Diagnosis not present

## 2020-09-09 DIAGNOSIS — M542 Cervicalgia: Secondary | ICD-10-CM | POA: Diagnosis not present

## 2020-09-14 DIAGNOSIS — K30 Functional dyspepsia: Secondary | ICD-10-CM | POA: Diagnosis not present

## 2020-09-14 DIAGNOSIS — Z8616 Personal history of COVID-19: Secondary | ICD-10-CM | POA: Diagnosis not present

## 2020-09-14 DIAGNOSIS — R519 Headache, unspecified: Secondary | ICD-10-CM | POA: Diagnosis not present

## 2020-09-29 ENCOUNTER — Other Ambulatory Visit: Payer: Self-pay | Admitting: Interventional Cardiology

## 2020-10-11 DIAGNOSIS — M542 Cervicalgia: Secondary | ICD-10-CM | POA: Diagnosis not present

## 2020-10-11 DIAGNOSIS — M25511 Pain in right shoulder: Secondary | ICD-10-CM | POA: Diagnosis not present

## 2020-10-15 DIAGNOSIS — Z951 Presence of aortocoronary bypass graft: Secondary | ICD-10-CM

## 2020-10-15 DIAGNOSIS — R079 Chest pain, unspecified: Secondary | ICD-10-CM

## 2020-10-15 DIAGNOSIS — E782 Mixed hyperlipidemia: Secondary | ICD-10-CM

## 2020-10-15 DIAGNOSIS — I480 Paroxysmal atrial fibrillation: Secondary | ICD-10-CM

## 2020-10-16 DIAGNOSIS — I2510 Atherosclerotic heart disease of native coronary artery without angina pectoris: Secondary | ICD-10-CM | POA: Diagnosis not present

## 2020-10-16 DIAGNOSIS — M179 Osteoarthritis of knee, unspecified: Secondary | ICD-10-CM | POA: Diagnosis not present

## 2020-10-16 DIAGNOSIS — E782 Mixed hyperlipidemia: Secondary | ICD-10-CM | POA: Diagnosis not present

## 2020-10-18 ENCOUNTER — Telehealth: Payer: Self-pay | Admitting: Gastroenterology

## 2020-10-18 NOTE — Telephone Encounter (Signed)
I spoke with the pt and he tells me that he has developed reflux and abd pain.  He takes Pepcid at bedtime and tums as needed.  He has only been seen in the office for screening colon.  He has been scheduled to see Amy on 9/6 at 130 pm.  He was advised that he can call his PCP in the meantime if he feels like he needs to see someone prior to 9/6.  I did give the pt anti reflux precautions to follow for now.

## 2020-10-18 NOTE — Telephone Encounter (Signed)
Patient called states he is having a lot of abdominal issues and is seeking help requesting a call back.

## 2020-10-19 ENCOUNTER — Other Ambulatory Visit: Payer: Medicare Other | Admitting: *Deleted

## 2020-10-19 ENCOUNTER — Ambulatory Visit (INDEPENDENT_AMBULATORY_CARE_PROVIDER_SITE_OTHER): Payer: Medicare Other

## 2020-10-19 ENCOUNTER — Ambulatory Visit (HOSPITAL_COMMUNITY): Payer: Medicare Other | Attending: Interventional Cardiology

## 2020-10-19 ENCOUNTER — Other Ambulatory Visit: Payer: Self-pay

## 2020-10-19 VITALS — BP 120/68 | HR 72 | Ht 68.0 in | Wt 174.0 lb

## 2020-10-19 DIAGNOSIS — R079 Chest pain, unspecified: Secondary | ICD-10-CM

## 2020-10-19 DIAGNOSIS — Z951 Presence of aortocoronary bypass graft: Secondary | ICD-10-CM

## 2020-10-19 DIAGNOSIS — I480 Paroxysmal atrial fibrillation: Secondary | ICD-10-CM

## 2020-10-19 LAB — ECHOCARDIOGRAM COMPLETE
Area-P 1/2: 3.68 cm2
S' Lateral: 2.6 cm

## 2020-10-19 NOTE — Progress Notes (Signed)
Reason for visit: EKG  Name of MD requesting visit: Dr. Irish Lack  H&P: CAD s/p CABG in 2007, pAF  Assessment and plan per MD: Patient reports to clinic today c/o abdominal discomfort that goes up into his chest. He states that this often happens after eating. He has taken Pepcid and Tums with relief. Denies NTG use. Discomfort described as indigestion/heartburn. C/O fatigue as well. Denies chest discomfort when riding his bike. EKG normal. Patient also had an echo and labs (BMET/TSH) checked today. Advised patient to reach out to GI MD. Made patient aware that Dr. Irish Lack will review his results and we will call him with those results and any other recommendations. Patient verbalized understanding. EKG to be scanned to the chart.

## 2020-10-20 LAB — BASIC METABOLIC PANEL
BUN/Creatinine Ratio: 14 (ref 10–24)
BUN: 16 mg/dL (ref 8–27)
CO2: 21 mmol/L (ref 20–29)
Calcium: 9.9 mg/dL (ref 8.6–10.2)
Chloride: 100 mmol/L (ref 96–106)
Creatinine, Ser: 1.11 mg/dL (ref 0.76–1.27)
Glucose: 86 mg/dL (ref 65–99)
Potassium: 4.2 mmol/L (ref 3.5–5.2)
Sodium: 141 mmol/L (ref 134–144)
eGFR: 71 mL/min/{1.73_m2} (ref >59)

## 2020-10-20 LAB — TSH: TSH: 3.21 u[IU]/mL (ref 0.450–4.500)

## 2020-10-21 DIAGNOSIS — M1711 Unilateral primary osteoarthritis, right knee: Secondary | ICD-10-CM | POA: Diagnosis not present

## 2020-10-25 ENCOUNTER — Ambulatory Visit: Payer: Medicare Other | Admitting: Physician Assistant

## 2020-10-28 DIAGNOSIS — M1711 Unilateral primary osteoarthritis, right knee: Secondary | ICD-10-CM | POA: Diagnosis not present

## 2020-11-07 ENCOUNTER — Other Ambulatory Visit (HOSPITAL_COMMUNITY): Payer: Medicare Other

## 2020-11-09 DIAGNOSIS — Z20822 Contact with and (suspected) exposure to covid-19: Secondary | ICD-10-CM | POA: Diagnosis not present

## 2020-11-09 DIAGNOSIS — U071 COVID-19: Secondary | ICD-10-CM | POA: Diagnosis not present

## 2020-11-09 DIAGNOSIS — Z03818 Encounter for observation for suspected exposure to other biological agents ruled out: Secondary | ICD-10-CM | POA: Diagnosis not present

## 2020-11-10 ENCOUNTER — Ambulatory Visit (INDEPENDENT_AMBULATORY_CARE_PROVIDER_SITE_OTHER): Payer: Medicare Other | Admitting: Physician Assistant

## 2020-11-10 ENCOUNTER — Encounter: Payer: Self-pay | Admitting: Physician Assistant

## 2020-11-10 VITALS — BP 120/60 | HR 80 | Ht 67.75 in | Wt 173.4 lb

## 2020-11-10 DIAGNOSIS — R1013 Epigastric pain: Secondary | ICD-10-CM

## 2020-11-10 DIAGNOSIS — I2510 Atherosclerotic heart disease of native coronary artery without angina pectoris: Secondary | ICD-10-CM

## 2020-11-10 DIAGNOSIS — K219 Gastro-esophageal reflux disease without esophagitis: Secondary | ICD-10-CM

## 2020-11-10 NOTE — Progress Notes (Signed)
Chief Complaint: GERD, falling asleep after eating and "upset stomach"  HPI:    Noah [name] is a 70 year old male, known to Dr. Ardis Hughs, with a past medical history of CAD, GERD, A. fib on aspirin and others listed below, who presents to clinic today with a complaint of chronic reflux, falling asleep after eating and upset stomach.    10/08/2016 MRI of the abdomen with and without contrast with a lesion in the right lower kidney and several tiny punctate T2 hyperintense lesions in the liver favoring hemangiomas as well as aortic atherosclerosis.    05/23/2020 colonoscopy with Dr. Ardis Hughs with ileitis and a few small, scattered erosions throughout the colon from the right colon and the left colon, diverticulosis in the left colon, external and internal hemorrhoids and otherwise normal.  There was a note made about his particularly abrupt, dramatic response to the prep.  Pathology was completely normal.  He thought that the changes he saw our "prep effect", no repeat colonoscopy given age.    Today, the patient presents to clinic and describes that he has had chronic reflux for years for which he uses a Pepcid AC when he has symptoms about 2 times a month.  Occasionally these are so severe they require Tums.  Typically relates to what he is eating.    Also describes that over the past year he has had about an episode a week where he will eat lunch and then sit down to watch TV and fall asleep.  Tells me that "I do not even feel tired", and his wife will just tell him that he "did it again".  This has never occurred if he eats and is active, just if he is sitting down.  Patient tells me he is somewhat worried that if he snacks in the car he will just fall asleep without knowing it.    Also describes that he had an episode within the last 6 months where he was out in the heat" I have always been able to handle the heat", but he developed what felt like an upset stomach and felt "weak", he checked his blood  pressure and it was okay but did have some what he calls chest discomfort.  Apparently followed with cardiology and was told that he was doing well, had repeat EKG and echo which showed improvement over the past 10 years.    Tells me that he is very active and bikes on a recumbent bike about 10 to 12 miles per day as well as does various exercises religiously.  Eats oatmeal in the mornings and tries to maintain a healthy diet.    Denies fever, chills, weight loss, change in bowel habits, blood in his stool or symptoms that awaken him from sleep.  Past Medical History:  Diagnosis Date   Allergy    seasonal- dust mites , tree pollen - worse in Spring    Anxiety    Arthritis    Coronary artery disease 07/13/2005   CABG   DDD (degenerative disc disease)    DJD (degenerative joint disease)    Elevated liver enzymes    GERD (gastroesophageal reflux disease)    Hemangioma of liver    Hemorrhoids    Hyperlipemia    Hypertension    PAC (premature atrial contraction)    Paroxysmal atrial fibrillation (HCC)    1 run of A Fib only- after heart surgery    Pneumonia    PVC (premature ventricular contraction)     Past  Surgical History:  Procedure Laterality Date   COLONOSCOPY     CORONARY ARTERY BYPASS GRAFT  07/13/2005   INGUINAL HERNIA REPAIR Right 1978, 2000   x 2   KNEE SURGERY Right    SEPTOPLASTY      Current Outpatient Medications  Medication Sig Dispense Refill   aspirin EC 81 MG tablet Take 1 tablet (81 mg total) by mouth daily.     calcium carbonate (TUMS - DOSED IN MG ELEMENTAL CALCIUM) 500 MG chewable tablet Chew 1 tablet by mouth daily as needed.     Cholecalciferol (VITAMIN D) 2000 UNITS tablet Take 2,000 Units by mouth daily.     famotidine (PEPCID) 10 MG tablet Take 10 mg by mouth daily as needed.     Flaxseed, Linseed, POWD Take 2 scoop by mouth as directed.     Ganciclovir (ZIRGAN) 0.15 % GEL 1 drop. As needed for cold sores     ibuprofen (ADVIL,MOTRIN) 200 MG tablet  Take 200 mg by mouth every 6 (six) hours as needed.     LORazepam (ATIVAN) 0.5 MG tablet Take 1 tablet by mouth at bedtime as needed for sleep. To help with sleep     meclizine (ANTIVERT) 25 MG tablet Take 25 mg by mouth 3 (three) times daily as needed for dizziness.     metoprolol succinate (TOPROL-XL) 25 MG 24 hr tablet Take 1 tablet (25 mg total) by mouth 2 (two) times daily. 180 tablet 3   metoprolol tartrate (LOPRESSOR) 25 MG tablet Take 1/2 -1 tab as needed 180 tablet 3   Multiple Vitamin (MULTIVITAMIN WITH MINERALS) TABS Take 1 tablet by mouth daily. Centrum silver     pramoxine-hydrocortisone (ANALPRAM HC) cream Apply 1 application topically at bedtime as needed (for skin).      Psyllium (METAMUCIL PO) 1 tablespoons by mouth as needed, for consitpation     rosuvastatin (CRESTOR) 20 MG tablet TAKE 1 TABLET DAILY 90 tablet 2   Ubiquinol 100 MG CAPS Take 100 mg by mouth daily.     nitroGLYCERIN (NITROSTAT) 0.4 MG SL tablet Place 1 tablet (0.4 mg total) under the tongue every 5 (five) minutes as needed for chest pain (3 DOSES MAX). (Patient not taking: Reported on 11/10/2020) 25 tablet 6   No current facility-administered medications for this visit.   Facility-Administered Medications Ordered in Other Visits  Medication Dose Route Frequency Provider Last Rate Last Admin   regadenoson (LEXISCAN) injection SOLN 0.4 mg  0.4 mg Intravenous Once Nahser, Wonda Cheng, MD        Allergies as of 11/10/2020 - Review Complete 11/10/2020  Allergen Reaction Noted   Cephalexin Other (See Comments) 02/28/2010   Levofloxacin Other (See Comments) 02/28/2010   Clarithromycin Other (See Comments) 09/02/2019   Penicillins Rash and Other (See Comments) 02/28/2010   Ciprofloxacin Diarrhea 11/09/2013   Doxycycline Other (See Comments) 11/09/2013   Zoster vac recomb adjuvanted Other (See Comments) 04/06/2020   Lipitor [atorvastatin] Palpitations 06/30/2013    Family History  Problem Relation Age of Onset    Coronary artery disease Father    Heart attack Father    Atrial fibrillation Mother 50   Colon polyps Mother    Seizures Brother    Prostate cancer Maternal Uncle    Heart attack Maternal Grandfather    Heart attack Paternal Grandmother    Heart attack Paternal Grandfather    Colon cancer Neg Hx    Esophageal cancer Neg Hx    Rectal cancer Neg Hx  Stomach cancer Neg Hx     Social History   Socioeconomic History   Marital status: Married    Spouse name: Not on file   Number of children: 1   Years of education: Not on file   Highest education level: Not on file  Occupational History   Occupation: retired    Fish farm manager: RETIRED  Tobacco Use   Smoking status: Former    Types: Cigarettes    Quit date: 02/19/1974    Years since quitting: 46.7   Smokeless tobacco: Never   Tobacco comments:    stopped 32 yrs ago  Substance and Sexual Activity   Alcohol use: Yes    Comment: rare   Drug use: No   Sexual activity: Not on file  Other Topics Concern   Not on file  Social History Narrative   Pt lives in [place] with spouse.  Son is grown.  Retired from [employer].     patient rides bicycle 7 to 8  Mile a day in a 1/2hr.   Social Determinants of Health   Financial Resource Strain: Not on file  Food Insecurity: Not on file  Transportation Needs: Not on file  Physical Activity: Not on file  Stress: Not on file  Social Connections: Not on file  Intimate Partner Violence: Not on file    Review of Systems:    Constitutional: No weight loss, fever or chills Cardiovascular: No chest pain Respiratory: No SOB  Gastrointestinal: See HPI and otherwise negative   Physical Exam:  Vital signs: BP 120/60 (BP Location: Left Arm, Patient Position: Sitting, Cuff Size: Normal)   Pulse 80   Ht 5' 7.75" (1.721 m) Comment: height measured without shoes  Wt 173 lb 6 oz (78.6 kg)   BMI 26.56 kg/m   Constitutional:   Pleasant Caucasian male appears to be in NAD, Well developed, Well  nourished, alert and cooperative Respiratory: Respirations even and unlabored. Lungs clear to auscultation bilaterally.   No wheezes, crackles, or rhonchi.  Cardiovascular: Normal S1, S2. No MRG. Regular rate and rhythm. No peripheral edema, cyanosis or pallor.  Gastrointestinal:  Soft, nondistended, nontender. No rebound or guarding. Normal bowel sounds. No appreciable masses or hepatomegaly. Psychiatric: Oriented to person, place and time. Demonstrates good judgement and reason without abnormal affect or behaviors.  RELEVANT LABS AND IMAGING: CBC    Component Value Date/Time   WBC 6.7 09/15/2019 1150   WBC 9.2 01/27/2013 1629   RBC 5.42 09/15/2019 1150   RBC 5.31 01/27/2013 1629   HGB 16.3 09/15/2019 1150   HCT 49.3 09/15/2019 1150   PLT 205 09/15/2019 1150   MCV 91 09/15/2019 1150   MCH 30.1 09/15/2019 1150   MCH 30.5 07/26/2011 0029   MCHC 33.1 09/15/2019 1150   MCHC 33.5 01/27/2013 1629   RDW 13.2 09/15/2019 1150   LYMPHSABS 1.2 01/27/2013 1629   MONOABS 0.5 01/27/2013 1629   EOSABS 0.1 01/27/2013 1629   BASOSABS 0.0 01/27/2013 1629    CMP     Component Value Date/Time   NA 141 10/19/2020 1336   K 4.2 10/19/2020 1336   CL 100 10/19/2020 1336   CO2 21 10/19/2020 1336   GLUCOSE 86 10/19/2020 1336   GLUCOSE 82 02/09/2016 1206   BUN 16 10/19/2020 1336   CREATININE 1.11 10/19/2020 1336   CREATININE 1.03 09/20/2015 1256   CALCIUM 9.9 10/19/2020 1336   PROT 7.3 07/29/2020 0729   ALBUMIN 4.6 07/29/2020 0729   AST 28 07/29/2020 0729   ALT  38 07/29/2020 0729   ALKPHOS 90 07/29/2020 0729   BILITOT 0.8 07/29/2020 0729   GFRNONAA 65 01/29/2020 0827   GFRAA 75 01/29/2020 0827    Assessment: 1.  Epigastric pain: Episode of nausea and discomfort when outside in the heat, has not recurred, does have chronic reflux symptoms; consider gastritis versus dehydration versus other 2.  GERD: Chronic about 1-2 times a month controlled with over-the-counter Pepcid and occasionally a  Tums; likely mild gastritis  Plan: 1.  Patient is requesting an EGD as he feels there is something going on in his stomach and wants to know for sure if there is or not.  Scheduled patient for an EGD with Dr. Ardis Hughs in the [facility].  Did provide the patient with a detailed list of risks of the procedure and he agrees to proceed.  Answered all of his questions. Patient is appropriate for endoscopic procedure(s) in the ambulatory ([place]) setting.  2.  Patient to continue his as needed Pepcid and Tums 3.  Briefly discussed patient falling asleep after eating, discussed that with age sometimes the body requires more energy to digest food and this could be related to the type of food he is eating/heavier meals etc.  He was questioning a possible allergy, I do not think this is the case. 4.  Patient to return to clinic per recommendations from Dr. Ardis Hughs after time of procedure.  Ellouise Newer, PA-C [facility] 11/10/2020, 1:18 PM  Cc: Lawerance Cruel, MD

## 2020-11-10 NOTE — Patient Instructions (Signed)
You have been scheduled for an endoscopy. Please follow written instructions given to you at your visit today. If you use inhalers (even only as needed), please bring them with you on the day of your procedure.  If you are age 72 or older, your body mass index should be between 23-30. Your Body mass index is 26.56 kg/m. If this is out of the aforementioned range listed, please consider follow up with your Primary Care Provider.  If you are age 52 or younger, your body mass index should be between 19-25. Your Body mass index is 26.56 kg/m. If this is out of the aformentioned range listed, please consider follow up with your Primary Care Provider.   __________________________________________________________  The [facility] providers would like to encourage you to use [facility] to communicate with providers for non-urgent requests or questions.  Due to long hold times on the telephone, sending your provider a message by [facility] may be a faster and more efficient way to get a response.  Please allow 48 business hours for a response.  Please remember that this is for non-urgent requests.

## 2020-11-11 NOTE — Progress Notes (Signed)
I agree with the above note, plan 

## 2020-11-15 DIAGNOSIS — R0981 Nasal congestion: Secondary | ICD-10-CM | POA: Diagnosis not present

## 2020-11-15 DIAGNOSIS — K30 Functional dyspepsia: Secondary | ICD-10-CM | POA: Diagnosis not present

## 2020-11-15 DIAGNOSIS — F411 Generalized anxiety disorder: Secondary | ICD-10-CM | POA: Diagnosis not present

## 2020-11-15 DIAGNOSIS — I700 Atherosclerosis of aorta: Secondary | ICD-10-CM | POA: Diagnosis not present

## 2020-11-15 DIAGNOSIS — Z23 Encounter for immunization: Secondary | ICD-10-CM | POA: Diagnosis not present

## 2020-11-21 DIAGNOSIS — M25511 Pain in right shoulder: Secondary | ICD-10-CM | POA: Diagnosis not present

## 2020-11-21 DIAGNOSIS — M542 Cervicalgia: Secondary | ICD-10-CM | POA: Diagnosis not present

## 2020-11-24 ENCOUNTER — Telehealth: Payer: Self-pay | Admitting: Interventional Cardiology

## 2020-11-24 DIAGNOSIS — R413 Other amnesia: Secondary | ICD-10-CM

## 2020-11-24 NOTE — Telephone Encounter (Signed)
Patient returning call.

## 2020-11-24 NOTE — Telephone Encounter (Signed)
Pt c/o BP issue: STAT if pt c/o blurred vision, one-sided weakness or slurred speech  1. What are your last 5 BP readings? 160/89 HR 101 YESTERDAY 126/86 HR 73 -CURRENTLY  2. Are you having any other symptoms (ex. Dizziness, headache, blurred vision, passed out)? PT HAD MEMORY LOSS FOR ABOUT 15 MINUTES YESTERDAY, HE COULD NOT REMEMBER HIS NIECES NAMES, PHONE NUMBER OR COMPLETE  COVID PAPERWORK. PT STATES HIS BP COULD HAVE BEEN ELEVATED BECAUSE HE PLAYED GOLF AND ATE A WHOLE PIZZA WHICH IS NOT NORMAL FOR HIM. PT SAID HE WASN'T FEELING SICK.  3. What is your BP issue? PT FEELS LIKE HIS BP WAS ELEVATED YESTERDAY. PT STATES HIS NORMAL BP ISN'T THIS HIGH

## 2020-11-24 NOTE — Telephone Encounter (Signed)
Pt advised Dr. Hassell Done recommendation for a Carotid doppler and agreed. he is seeing his PCP Dr. Harrington Challenger tomorrow 11/25/20.

## 2020-11-24 NOTE — Telephone Encounter (Signed)
Left a message for the pt to call back.  

## 2020-11-24 NOTE — Telephone Encounter (Signed)
Called patient back about his message. Informed patient that we received his mychart message and we have sent it to Dr. Irish Lack. Encouraged patient to call his PCP about a possible TIA. Patient stated he was hoping Dr. Irish Lack would order a CT or something.  Informed patient that a message would be sent to Dr. Irish Lack for advisement.

## 2020-11-24 NOTE — Telephone Encounter (Signed)
OK to order carotid DOpplers.  WOuld check with PCP if they would want to order CT or MRI of brain.  Already had recent echo so will not repeat at this time.

## 2020-11-25 ENCOUNTER — Ambulatory Visit (HOSPITAL_COMMUNITY)
Admission: RE | Admit: 2020-11-25 | Discharge: 2020-11-25 | Disposition: A | Discharge status: Home / Self Care | Payer: Medicare Other | Source: Ambulatory Visit | Attending: Cardiovascular Disease | Admitting: Cardiovascular Disease

## 2020-11-25 ENCOUNTER — Other Ambulatory Visit: Payer: Self-pay

## 2020-11-25 DIAGNOSIS — R413 Other amnesia: Secondary | ICD-10-CM | POA: Insufficient documentation

## 2020-11-28 ENCOUNTER — Other Ambulatory Visit: Payer: Self-pay | Admitting: Family Medicine

## 2020-11-28 DIAGNOSIS — F411 Generalized anxiety disorder: Secondary | ICD-10-CM | POA: Diagnosis not present

## 2020-11-28 DIAGNOSIS — R413 Other amnesia: Secondary | ICD-10-CM | POA: Diagnosis not present

## 2020-11-29 ENCOUNTER — Encounter (HOSPITAL_COMMUNITY): Payer: Medicare Other

## 2020-11-30 ENCOUNTER — Encounter: Payer: Medicare Other | Admitting: Gastroenterology

## 2020-11-30 ENCOUNTER — Other Ambulatory Visit: Payer: Self-pay | Admitting: Family Medicine

## 2020-11-30 DIAGNOSIS — M179 Osteoarthritis of knee, unspecified: Secondary | ICD-10-CM | POA: Diagnosis not present

## 2020-11-30 DIAGNOSIS — R413 Other amnesia: Secondary | ICD-10-CM

## 2020-11-30 DIAGNOSIS — I2510 Atherosclerotic heart disease of native coronary artery without angina pectoris: Secondary | ICD-10-CM | POA: Diagnosis not present

## 2020-11-30 DIAGNOSIS — E782 Mixed hyperlipidemia: Secondary | ICD-10-CM | POA: Diagnosis not present

## 2020-12-02 ENCOUNTER — Other Ambulatory Visit: Payer: Self-pay | Admitting: Family Medicine

## 2020-12-02 DIAGNOSIS — R413 Other amnesia: Secondary | ICD-10-CM

## 2020-12-02 DIAGNOSIS — Z23 Encounter for immunization: Secondary | ICD-10-CM | POA: Diagnosis not present

## 2020-12-05 DIAGNOSIS — M25511 Pain in right shoulder: Secondary | ICD-10-CM | POA: Diagnosis not present

## 2020-12-05 DIAGNOSIS — M542 Cervicalgia: Secondary | ICD-10-CM | POA: Diagnosis not present

## 2020-12-12 ENCOUNTER — Ambulatory Visit
Admission: RE | Admit: 2020-12-12 | Discharge: 2020-12-12 | Disposition: A | Discharge status: Home / Self Care | Payer: Medicare Other | Source: Ambulatory Visit | Attending: Family Medicine | Admitting: Family Medicine

## 2020-12-12 ENCOUNTER — Other Ambulatory Visit: Payer: Self-pay

## 2020-12-12 DIAGNOSIS — R413 Other amnesia: Secondary | ICD-10-CM | POA: Diagnosis not present

## 2020-12-12 DIAGNOSIS — G319 Degenerative disease of nervous system, unspecified: Secondary | ICD-10-CM | POA: Diagnosis not present

## 2020-12-12 DIAGNOSIS — I6782 Cerebral ischemia: Secondary | ICD-10-CM | POA: Diagnosis not present

## 2020-12-15 DIAGNOSIS — R490 Dysphonia: Secondary | ICD-10-CM | POA: Diagnosis not present

## 2020-12-15 DIAGNOSIS — R930 Abnormal findings on diagnostic imaging of skull and head, not elsewhere classified: Secondary | ICD-10-CM | POA: Diagnosis not present

## 2020-12-15 DIAGNOSIS — R0981 Nasal congestion: Secondary | ICD-10-CM | POA: Diagnosis not present

## 2020-12-20 DIAGNOSIS — M542 Cervicalgia: Secondary | ICD-10-CM | POA: Diagnosis not present

## 2020-12-20 DIAGNOSIS — M25511 Pain in right shoulder: Secondary | ICD-10-CM | POA: Diagnosis not present

## 2020-12-27 DIAGNOSIS — M25511 Pain in right shoulder: Secondary | ICD-10-CM | POA: Diagnosis not present

## 2020-12-27 DIAGNOSIS — M542 Cervicalgia: Secondary | ICD-10-CM | POA: Diagnosis not present

## 2021-01-09 ENCOUNTER — Encounter: Payer: Self-pay | Admitting: Interventional Cardiology

## 2021-01-09 DIAGNOSIS — M25511 Pain in right shoulder: Secondary | ICD-10-CM | POA: Diagnosis not present

## 2021-01-09 DIAGNOSIS — M542 Cervicalgia: Secondary | ICD-10-CM | POA: Diagnosis not present

## 2021-01-16 ENCOUNTER — Encounter: Payer: Self-pay | Admitting: Interventional Cardiology

## 2021-01-17 DIAGNOSIS — M542 Cervicalgia: Secondary | ICD-10-CM | POA: Diagnosis not present

## 2021-01-17 DIAGNOSIS — M25511 Pain in right shoulder: Secondary | ICD-10-CM | POA: Diagnosis not present

## 2021-01-17 MED ORDER — METOPROLOL TARTRATE 25 MG PO TABS: ORAL_TABLET | ORAL | 0 refills | Status: DC

## 2021-01-19 MED ORDER — NITROGLYCERIN 0.4 MG SL SUBL: 0.4000 mg | SUBLINGUAL_TABLET | SUBLINGUAL | 6 refills | Status: DC | PRN

## 2021-01-19 NOTE — Addendum Note (Signed)
Addended by: Carter Kitten D on: 01/19/2021 09:33 AM   Modules accepted: Orders

## 2021-01-20 DIAGNOSIS — R197 Diarrhea, unspecified: Secondary | ICD-10-CM | POA: Diagnosis not present

## 2021-01-20 DIAGNOSIS — Z6824 Body mass index (BMI) 24.0-24.9, adult: Secondary | ICD-10-CM | POA: Diagnosis not present

## 2021-01-20 DIAGNOSIS — R0981 Nasal congestion: Secondary | ICD-10-CM | POA: Diagnosis not present

## 2021-01-20 DIAGNOSIS — R059 Cough, unspecified: Secondary | ICD-10-CM | POA: Diagnosis not present

## 2021-01-20 DIAGNOSIS — H9209 Otalgia, unspecified ear: Secondary | ICD-10-CM | POA: Diagnosis not present

## 2021-01-20 DIAGNOSIS — Z03818 Encounter for observation for suspected exposure to other biological agents ruled out: Secondary | ICD-10-CM | POA: Diagnosis not present

## 2021-01-26 DIAGNOSIS — M25511 Pain in right shoulder: Secondary | ICD-10-CM | POA: Diagnosis not present

## 2021-01-26 DIAGNOSIS — M542 Cervicalgia: Secondary | ICD-10-CM | POA: Diagnosis not present

## 2021-01-26 DIAGNOSIS — L820 Inflamed seborrheic keratosis: Secondary | ICD-10-CM | POA: Diagnosis not present

## 2021-01-30 DIAGNOSIS — M25511 Pain in right shoulder: Secondary | ICD-10-CM | POA: Diagnosis not present

## 2021-01-30 DIAGNOSIS — M542 Cervicalgia: Secondary | ICD-10-CM | POA: Diagnosis not present

## 2021-02-08 ENCOUNTER — Encounter: Payer: Medicare Other | Admitting: Gastroenterology

## 2021-02-28 ENCOUNTER — Other Ambulatory Visit: Payer: Medicare Other

## 2021-03-01 ENCOUNTER — Other Ambulatory Visit: Payer: Self-pay

## 2021-03-01 ENCOUNTER — Ambulatory Visit (AMBULATORY_SURGERY_CENTER): Payer: Medicare Other | Admitting: *Deleted

## 2021-03-01 VITALS — Ht 67.75 in | Wt 164.0 lb

## 2021-03-01 DIAGNOSIS — K219 Gastro-esophageal reflux disease without esophagitis: Secondary | ICD-10-CM

## 2021-03-01 DIAGNOSIS — R1013 Epigastric pain: Secondary | ICD-10-CM

## 2021-03-01 NOTE — Progress Notes (Signed)
Patient's pre-visit was done today over the phone with the patient. Name,DOB and address verified. Patient denies any allergies to Eggs and Soy. Patient denies any problems with anesthesia/sedation. Patient is not taking any diet pills or blood thinners. No home Oxygen. Patient had "chest discomfort after eating while in [place] in [place].", took pepcid and it cleared it up-per pt. Prep instructions sent to pt's MyChart-pt aware. Patient understands to call us back with any questions or concerns. Patient is aware of our care-partner policy and [facility]-40 safety protocol.   EMMI education assigned to the patient for the procedure, sent to [place].   The patient is COVID-19 vaccinated.

## 2021-03-03 ENCOUNTER — Other Ambulatory Visit: Payer: Self-pay

## 2021-03-03 ENCOUNTER — Other Ambulatory Visit: Payer: Medicare Other | Admitting: *Deleted

## 2021-03-03 ENCOUNTER — Encounter: Payer: Self-pay | Admitting: Gastroenterology

## 2021-03-03 DIAGNOSIS — E782 Mixed hyperlipidemia: Secondary | ICD-10-CM

## 2021-03-03 DIAGNOSIS — Z951 Presence of aortocoronary bypass graft: Secondary | ICD-10-CM

## 2021-03-05 LAB — COMPREHENSIVE METABOLIC PANEL
ALT: 25 IU/L (ref 0–44)
AST: 21 IU/L (ref 0–40)
Albumin/Globulin Ratio: 1.8 (ref 1.2–2.2)
Albumin: 4.5 g/dL (ref 3.7–4.7)
Alkaline Phosphatase: 86 IU/L (ref 44–121)
BUN/Creatinine Ratio: 11 (ref 10–24)
BUN: 12 mg/dL (ref 8–27)
Bilirubin Total: 0.8 mg/dL (ref 0.0–1.2)
CO2: 24 mmol/L (ref 20–29)
Calcium: 9.2 mg/dL (ref 8.6–10.2)
Chloride: 101 mmol/L (ref 96–106)
Creatinine, Ser: 1.11 mg/dL (ref 0.76–1.27)
Globulin, Total: 2.5 g/dL (ref 1.5–4.5)
Glucose: 86 mg/dL (ref 70–99)
Potassium: 4.1 mmol/L (ref 3.5–5.2)
Sodium: 139 mmol/L (ref 134–144)
Total Protein: 7 g/dL (ref 6.0–8.5)
eGFR: 71 mL/min/{1.73_m2} (ref >59)

## 2021-03-05 LAB — NMR, LIPOPROFILE
Cholesterol, Total: 115 mg/dL (ref 100–199)
HDL Particle Number: 37 umol/L (ref >=30.5)
HDL-C: 50 mg/dL (ref >39)
LDL Particle Number: 655 nmol/L (ref <1000)
LDL Size: 20.1 nm — ABNORMAL LOW (ref >20.5)
LDL-C (NIH Calc): 44 mg/dL (ref 0–99)
LP-IR Score: 40 (ref <=45)
Small LDL Particle Number: 435 nmol/L (ref <=527)
Triglycerides: 119 mg/dL (ref 0–149)

## 2021-03-05 LAB — APOLIPOPROTEIN B: Apolipoprotein B: 59 mg/dL (ref <90)

## 2021-03-05 NOTE — Progress Notes (Signed)
Cardiology Office Note   Date:  03/07/2021   ID:  [name], DOB Mar 14, 1950, MRN [id number]  PCP:  Lawerance Cruel, MD    Chief Complaint  Patient presents with   Follow-up   CAD  Wt Readings from Last 3 Encounters:  03/07/21 174 lb (78.9 kg)  03/01/21 164 lb (74.4 kg)  11/10/20 173 lb 6 oz (78.6 kg)       History of Present Illness: Noah [name] is a 71 y.o. male  with coronary artery disease status post CABG in 2007, remote paroxysmal atrial fibrillation (noted many years ago), hyperlipidemia (intolerant of some statins).   In 2/19,  He has had some palpitations.  48-hour Holter monitor demonstrated normal rhythm, PVCs and PACs.  There was no atrial fibrillation noted.    He was evaluated for palpitations in the office in   9/19. He was under stress.  He was taking an extra half of metoprolol with less benefit than usual.   Echocardiogram 06/10/2017 Mild LVH, EF 65-70, normal wall motion, grade 1 diastolic dysfunction, aortic sclerosis without stenosis, trivial AI, mild LAE   48-hour Holter 04/01/2017 Normal sinus rhythm, PACs and PVCs correlate with symptoms; no significant arrhythmias   Nuclear stress test 10/23/2016 EF 67, no ischemia, low risk   Carotid US 04/27/2015 Bilateral ICA 1-39; follow-up as needed   In the past, he has had some flucuating heart rates while exercising.  He tolerates riding the bike well.  In 2020, he increased his bike duration and decreased carbs.  His lipids were improved in 12/2018.   [place] vaccine.   He had issues with loss of balance and a fall in July 2021.  He was stepping over a child protective gate at the base of the stairs.  He got stressed and went to ER. Troponins were negative. BNP negative.  CT head was negative for any trauma.     He was referred to neurology.  He also reported some increased HR.  EMS has done an ECG and he was told he had NSR with PVCs.  Cr 1.07.  He has had some episodes where is  resting HR has been high as well.   In 2021, reported: "He continues to have frequent urination and dizziness.  He feels that his HR is going up faster with exercise.  He is sweating more. No SHOB or chest pain with exercise.   Still has some orthostatic sx when getting up from the floor after doing exercises.   Had an episode where he started tilting to the left when he was walking."     In 5/22, he wanted a CK drawn after sending the following: " I want to see if the problems with both shoulders is due to the statins Ive been taking.  Its a situation that I think started just about when I switched from Lipitor to  Crestor back around 2013/2014.  So my thought is maybe it is the Crestor because I did not have any injuries from falls or sports or anything  for that matter."  He has developed tinnitus.  Saw ENT.  Now trying hearing aids.   Normal nuclear stress test in March 2022.   He had chest discomfort after overeating at a birthday in [place] in late 2022.  Symptoms resolved with Pepcid AC.  He has endoscopy planned tomorrow.  He continues to exercise and not have any problems.  Denies : exertional Chest pain. Dizziness. Leg  edema. Nitroglycerin use. Orthopnea. Palpitations. Paroxysmal nocturnal dyspnea. Shortness of breath. Syncope.     Past Medical History:  Diagnosis Date   Allergy    seasonal- dust mites , tree pollen - worse in Spring    Anxiety    Arthritis    Coronary artery disease 07/13/2005   CABG   DDD (degenerative disc disease)    DJD (degenerative joint disease)    Elevated liver enzymes    GERD (gastroesophageal reflux disease)    Hemangioma of liver    Hemorrhoids    Hyperlipemia    Hypertension    PAC (premature atrial contraction)    Paroxysmal atrial fibrillation (HCC)    1 run of A Fib only- after heart surgery    Pneumonia    PVC (premature ventricular contraction)     Past Surgical History:  Procedure Laterality Date   COLONOSCOPY  05/2020    CORONARY ARTERY BYPASS GRAFT  07/13/2005   INGUINAL HERNIA REPAIR Right 1978, 2000   x 2   KNEE SURGERY Right    SEPTOPLASTY       Current Outpatient Medications  Medication Sig Dispense Refill   aspirin EC 81 MG tablet Take 1 tablet (81 mg total) by mouth daily.     calcium carbonate (TUMS - DOSED IN MG ELEMENTAL CALCIUM) 500 MG chewable tablet Chew 1 tablet by mouth daily as needed.     Cholecalciferol (VITAMIN D) 2000 UNITS tablet Take 2,000 Units by mouth daily.     famotidine (PEPCID) 10 MG tablet Take 10 mg by mouth daily as needed.     Flaxseed, Linseed, POWD Take 2 scoop by mouth as directed.     Ganciclovir (ZIRGAN) 1.61 % GEL 1 application. As needed for cold sores     ibuprofen (ADVIL,MOTRIN) 200 MG tablet Take 200 mg by mouth every 6 (six) hours as needed.     LORazepam (ATIVAN) 0.5 MG tablet Take 1 tablet by mouth at bedtime as needed for sleep. To help with sleep     meclizine (ANTIVERT) 25 MG tablet Take 25 mg by mouth 3 (three) times daily as needed for dizziness.     metoprolol succinate (TOPROL-XL) 25 MG 24 hr tablet Take 1 tablet (25 mg total) by mouth 2 (two) times daily. 180 tablet 3   metoprolol tartrate (LOPRESSOR) 25 MG tablet Take 1/2 -1 tab as needed 90 tablet 0   Multiple Vitamin (MULTIVITAMIN WITH MINERALS) TABS Take 1 tablet by mouth daily. Centrum silver     nitroGLYCERIN (NITROSTAT) 0.4 MG SL tablet Place 1 tablet (0.4 mg total) under the tongue every 5 (five) minutes as needed for chest pain (3 DOSES MAX). 25 tablet 6   pramoxine-hydrocortisone (ANALPRAM HC) cream Apply 1 application topically at bedtime as needed (for skin).      Psyllium (METAMUCIL PO) 1 tablespoons by mouth as needed, for consitpation     rosuvastatin (CRESTOR) 20 MG tablet TAKE 1 TABLET DAILY 90 tablet 2   Ubiquinol 100 MG CAPS Take 100 mg by mouth daily.     No current facility-administered medications for this visit.   Facility-Administered Medications Ordered in Other Visits   Medication Dose Route Frequency Provider Last Rate Last Admin   regadenoson (LEXISCAN) injection SOLN 0.4 mg  0.4 mg Intravenous Once Nahser, Wonda Cheng, MD        Allergies:   Cephalexin, Levofloxacin, Zoster vac recomb adjuvanted, Clarithromycin, Penicillins, Ciprofloxacin, Doxycycline, and Lipitor [atorvastatin]    Social History:  The patient  reports that he quit smoking about 47 years ago. His smoking use included cigarettes. He has never used smokeless tobacco. He reports current alcohol use. He reports that he does not use drugs.   Family History:  The patient's family history includes Atrial fibrillation (age of onset: 73) in his mother; Colon polyps in his mother; Coronary artery disease in his father; Heart attack in his father, maternal grandfather, paternal grandfather, and paternal grandmother; Prostate cancer in his maternal uncle; Seizures in his brother.    ROS:  Please see the history of present illness.   Otherwise, review of systems are positive for tinnitus.   All other systems are reviewed and negative.    PHYSICAL EXAM: VS:  BP 100/64    Pulse 72    Ht 5\' 8"  (1.727 m)    Wt 174 lb (78.9 kg)    SpO2 98%    BMI 26.46 kg/m  , BMI Body mass index is 26.46 kg/m. GEN: Well nourished, well developed, in no acute distress HEENT: normal Neck: no JVD, carotid bruits, or masses Cardiac: RRR; no murmurs, rubs, or gallops,no edema  Respiratory:  clear to auscultation bilaterally, normal work of breathing GI: soft, nontender, nondistended, + BS MS: no deformity or atrophy Skin: warm and dry, no rash Neuro:  Strength and sensation are intact Psych: euthymic mood, full affect   EKG:   The ekg ordered today demonstrates NSR, no ST changes   Recent Labs: 10/19/2020: TSH 3.210 03/03/2021: ALT 25; BUN 12; Creatinine, Ser 1.11; Potassium 4.1; Sodium 139   Lipid Panel    Component Value Date/Time   CHOL 116 01/23/2017 1030   CHOL 121 02/09/2016 1206   CHOL 124 07/28/2014  0903   TRIG 108 01/23/2017 1030   TRIG 112 07/28/2014 0903   HDL 46 01/23/2017 1030   HDL 46 07/28/2014 0903   CHOLHDL 2.5 02/09/2016 1206   LDLCALC 53 02/09/2016 1206   LDLCALC 56 07/28/2014 0903     Other studies Reviewed: Additional studies/ records that were reviewed today with results demonstrating: lipids reviewed from last week-he also discussed this with the Pharm.D.   ASSESSMENT AND PLAN:  CAD: s/p CABG. no angina on medial therapy.  I think his CP at [facility] was not cardiac.  Continue aggressive secondary prevention.  Continue regular exercise.  Feels well while exercising on the bike.   Hyperlipidemia: NMR lipids reviewed in detail.  Continue current medications. HTN: Low-salt diet.  Avoid processed foods.  Whole food, plant-based diet. PAF: In NSR.  Continue aspirin and metoprolol. PreDM: Dietary changes discussed. Carotid Disease: mild disease in the past.  Normal vertebral artery flow.  No restrictions at the chiropractor.   Current medicines are reviewed at length with the patient today.  The patient concerns regarding his medicines were addressed.  The following changes have been made:  No change  Labs/ tests ordered today include:   Orders Placed This Encounter  Procedures   EKG 12-Lead    Recommend 150 minutes/week of aerobic exercise Low fat, low carb, high fiber diet recommended  Disposition:   FU in 6 months   Signed, Larae Grooms, MD  03/07/2021 2:52 PM    [facility] [place], [place],   [address] Phone: [phone]; Fax: [phone]

## 2021-03-06 ENCOUNTER — Telehealth: Payer: Self-pay | Admitting: Gastroenterology

## 2021-03-06 NOTE — Telephone Encounter (Signed)
Patient called requesting to speak with a nurse regarding a new diagnosis said he has Tinnitus and would like to discuss that with a nurse and having a colonoscopy.

## 2021-03-06 NOTE — Telephone Encounter (Signed)
Spoke with patient-ensured the patient is able to have proceed as scheduled.

## 2021-03-07 ENCOUNTER — Ambulatory Visit: Payer: Medicare Other | Admitting: Interventional Cardiology

## 2021-03-07 ENCOUNTER — Other Ambulatory Visit: Payer: Self-pay

## 2021-03-07 ENCOUNTER — Encounter: Payer: Self-pay | Admitting: Interventional Cardiology

## 2021-03-07 VITALS — BP 100/64 | HR 72 | Ht 68.0 in | Wt 174.0 lb

## 2021-03-07 DIAGNOSIS — I25118 Atherosclerotic heart disease of native coronary artery with other forms of angina pectoris: Secondary | ICD-10-CM | POA: Diagnosis not present

## 2021-03-07 DIAGNOSIS — I10 Essential (primary) hypertension: Secondary | ICD-10-CM

## 2021-03-07 DIAGNOSIS — Z951 Presence of aortocoronary bypass graft: Secondary | ICD-10-CM

## 2021-03-07 DIAGNOSIS — I480 Paroxysmal atrial fibrillation: Secondary | ICD-10-CM

## 2021-03-07 DIAGNOSIS — E782 Mixed hyperlipidemia: Secondary | ICD-10-CM | POA: Diagnosis not present

## 2021-03-07 DIAGNOSIS — R7303 Prediabetes: Secondary | ICD-10-CM

## 2021-03-07 NOTE — Patient Instructions (Signed)
Medication Instructions:  Your physician recommends that you continue on your current medications as directed. Please refer to the Current Medication list given to you today.  *If you need a refill on your cardiac medications before your next appointment, please call your pharmacy*   Lab Work: none If you have labs (blood work) drawn today and your tests are completely normal, you will receive your results only by: [place] (if you have MyChart) OR A paper copy in the mail If you have any lab test that is abnormal or we need to change your treatment, we will call you to review the results.   Testing/Procedures: none   Follow-Up: At [facility], you and your health needs are our priority.  As part of our continuing mission to provide you with exceptional heart care, we have created designated Provider Care Teams.  These Care Teams include your primary Cardiologist (physician) and Advanced Practice Providers (APPs -  Physician Assistants and Nurse Practitioners) who all work together to provide you with the care you need, when you need it.  We recommend signing up for the patient portal called "MyChart".  Sign up information is provided on this After Visit Summary.  MyChart is used to connect with patients for Virtual Visits (Telemedicine).  Patients are able to view lab/test results, encounter notes, upcoming appointments, etc.  Non-urgent messages can be sent to your provider as well.   To learn more about what you can do with MyChart, go to [email or web address].    Your next appointment:   6 month(s)  The format for your next appointment:   In Person  Provider:  Dr Irish Lack Dr     Other Instructions

## 2021-03-08 ENCOUNTER — Encounter: Payer: Self-pay | Admitting: Gastroenterology

## 2021-03-08 ENCOUNTER — Telehealth: Payer: Self-pay | Admitting: Gastroenterology

## 2021-03-08 ENCOUNTER — Ambulatory Visit (AMBULATORY_SURGERY_CENTER): Payer: Medicare Other | Admitting: Gastroenterology

## 2021-03-08 VITALS — BP 99/53 | HR 59 | Temp 97.1°F | Resp 13 | Ht 67.0 in | Wt 164.0 lb

## 2021-03-08 DIAGNOSIS — K219 Gastro-esophageal reflux disease without esophagitis: Secondary | ICD-10-CM

## 2021-03-08 DIAGNOSIS — R12 Heartburn: Secondary | ICD-10-CM | POA: Diagnosis not present

## 2021-03-08 DIAGNOSIS — R1013 Epigastric pain: Secondary | ICD-10-CM | POA: Diagnosis not present

## 2021-03-08 DIAGNOSIS — K2970 Gastritis, unspecified, without bleeding: Secondary | ICD-10-CM | POA: Diagnosis not present

## 2021-03-08 MED ORDER — SODIUM CHLORIDE 0.9 % IV SOLN: 500.0000 mL | Freq: Once | INTRAVENOUS | Status: DC

## 2021-03-08 MED ORDER — FAMOTIDINE 20 MG PO TABS: 20.0000 mg | ORAL_TABLET | Freq: Every day | ORAL | 3 refills | Status: DC

## 2021-03-08 NOTE — Progress Notes (Signed)
V/S by Butch Penny

## 2021-03-08 NOTE — Addendum Note (Signed)
Addended by: Christell Constant F on: 03/08/2021 11:58 AM   Modules accepted: Orders

## 2021-03-08 NOTE — Progress Notes (Signed)
Called to room to assist during endoscopic procedure.  Patient ID and intended procedure confirmed with present staff. Received instructions for my participation in the procedure from the performing physician.  

## 2021-03-08 NOTE — Telephone Encounter (Signed)
Inbound call from patient states he is already taking pepcid complete chewables 10mg . States should he continue to take the chewables and take 2 or will the prescription for tablets for 20mg s be sent in? Best call back number [phone]

## 2021-03-08 NOTE — Progress Notes (Signed)
HPI: This is a man  seen in office by Noah [name] 4 months ago.  Epigastric pain and chronic GERD.   ROS: complete GI ROS as described in HPI, all other review negative.  Constitutional:  No unintentional weight loss   Past Medical History:  Diagnosis Date   Allergy    seasonal- dust mites , tree pollen - worse in Spring    Anxiety    Arthritis    Coronary artery disease 07/13/2005   CABG   DDD (degenerative disc disease)    DJD (degenerative joint disease)    Elevated liver enzymes    GERD (gastroesophageal reflux disease)    Hemangioma of liver    Hemorrhoids    Hyperlipemia    Hypertension    PAC (premature atrial contraction)    Paroxysmal atrial fibrillation (HCC)    1 run of A Fib only- after heart surgery    Pneumonia    PVC (premature ventricular contraction)     Past Surgical History:  Procedure Laterality Date   COLONOSCOPY  05/2020   CORONARY ARTERY BYPASS GRAFT  07/13/2005   INGUINAL HERNIA REPAIR Right 1978, 2000   x 2   KNEE SURGERY Right    SEPTOPLASTY      Current Outpatient Medications  Medication Sig Dispense Refill   aspirin EC 81 MG tablet Take 1 tablet (81 mg total) by mouth daily.     Cholecalciferol (VITAMIN D) 2000 UNITS tablet Take 2,000 Units by mouth daily.     Flaxseed, Linseed, POWD Take 2 scoop by mouth as directed.     ibuprofen (ADVIL,MOTRIN) 200 MG tablet Take 200 mg by mouth every 6 (six) hours as needed.     LORazepam (ATIVAN) 0.5 MG tablet Take 1 tablet by mouth at bedtime as needed for sleep. To help with sleep     metoprolol succinate (TOPROL-XL) 25 MG 24 hr tablet Take 1 tablet (25 mg total) by mouth 2 (two) times daily. 180 tablet 3   metoprolol tartrate (LOPRESSOR) 25 MG tablet Take 1/2 -1 tab as needed 90 tablet 0   Multiple Vitamin (MULTIVITAMIN WITH MINERALS) TABS Take 1 tablet by mouth daily. Centrum silver     rosuvastatin (CRESTOR) 20 MG tablet TAKE 1 TABLET DAILY 90 tablet 2   Ubiquinol 100 MG CAPS Take 100 mg by mouth  daily.     calcium carbonate (TUMS - DOSED IN MG ELEMENTAL CALCIUM) 500 MG chewable tablet Chew 1 tablet by mouth daily as needed.     famotidine (PEPCID) 10 MG tablet Take 10 mg by mouth daily as needed.     Ganciclovir (ZIRGAN) 1.24 % GEL 1 application. As needed for cold sores     meclizine (ANTIVERT) 25 MG tablet Take 25 mg by mouth 3 (three) times daily as needed for dizziness.     nitroGLYCERIN (NITROSTAT) 0.4 MG SL tablet Place 1 tablet (0.4 mg total) under the tongue every 5 (five) minutes as needed for chest pain (3 DOSES MAX). 25 tablet 6   pramoxine-hydrocortisone (ANALPRAM HC) cream Apply 1 application topically at bedtime as needed (for skin).      Psyllium (METAMUCIL PO) 1 tablespoons by mouth as needed, for consitpation     Current Facility-Administered Medications  Medication Dose Route Frequency Provider Last Rate Last Admin   0.9 %  sodium chloride infusion  500 mL Intravenous Once Milus Banister, MD       Facility-Administered Medications Ordered in Other Visits  Medication Dose Route Frequency Provider Last  Rate Last Admin   regadenoson (LEXISCAN) injection SOLN 0.4 mg  0.4 mg Intravenous Once Nahser, Wonda Cheng, MD        Allergies as of 03/08/2021 - Review Complete 03/08/2021  Allergen Reaction Noted   Cephalexin Other (See Comments) 02/28/2010   Levofloxacin Other (See Comments) 02/28/2010   Zoster vac recomb adjuvanted Other (See Comments) 04/06/2020   Clarithromycin Other (See Comments) 09/02/2019   Penicillins Rash and Other (See Comments) 02/28/2010   Ciprofloxacin Diarrhea 11/09/2013   Doxycycline Other (See Comments) 11/09/2013   Lipitor [atorvastatin] Palpitations 06/30/2013    Family History  Problem Relation Age of Onset   Coronary artery disease Father    Heart attack Father    Atrial fibrillation Mother 34   Colon polyps Mother    Seizures Brother    Prostate cancer Maternal Uncle    Heart attack Maternal Grandfather    Heart attack Paternal  Grandmother    Heart attack Paternal Grandfather    Colon cancer Neg Hx    Esophageal cancer Neg Hx    Rectal cancer Neg Hx    Stomach cancer Neg Hx     Social History   Socioeconomic History   Marital status: Married    Spouse name: Not on file   Number of children: 1   Years of education: Not on file   Highest education level: Not on file  Occupational History   Occupation: retired    Fish farm manager: RETIRED  Tobacco Use   Smoking status: Former    Types: Cigarettes    Quit date: 02/19/1974    Years since quitting: 47.0   Smokeless tobacco: Never   Tobacco comments:    stopped 32 yrs ago  Substance and Sexual Activity   Alcohol use: Yes    Comment: rare   Drug use: No   Sexual activity: Not on file  Other Topics Concern   Not on file  Social History Narrative   Pt lives in [place] with spouse.  Son is grown.  Retired from [employer].     patient rides bicycle 7 to 8  Mile a day in a 1/2hr.   Social Determinants of Health   Financial Resource Strain: Not on file  Food Insecurity: Not on file  Transportation Needs: Not on file  Physical Activity: Not on file  Stress: Not on file  Social Connections: Not on file  Intimate Partner Violence: Not on file     Physical Exam: BP 129/70    Pulse 76    Temp (!) 97.1 F (36.2 C)    Ht 5\' 7"  (1.702 m)    Wt 164 lb (74.4 kg)    SpO2 100%    BMI 25.69 kg/m  Constitutional: generally well-appearing Psychiatric: alert and oriented x3 Lungs: CTA bilaterally Heart: no MCR  Assessment and plan: 71 y.o. male with epigastric pain, chronic GERD  Egd today  Care is appropriate for the ambulatory setting.  Noah Loffler, MD [facility] 03/08/2021, 9:34 AM

## 2021-03-08 NOTE — Telephone Encounter (Signed)
Returned patient call.  Sent in rx for Pepcid 20 mg q hs 90 days with 3 refills.

## 2021-03-08 NOTE — Patient Instructions (Signed)
Thank you for allowing [place] to care for you today! Await biopsy results, approximately 2 weeks.  Will make necessary recommendations at that time. Continue Pepcid (famotidine) 20 mg at bedtime nightly. Resume other medications today, return to normal daily activities tomorrow, 03/09/21.      YOU HAD AN ENDOSCOPIC PROCEDURE TODAY AT [facility]:   Refer to the procedure report that was given to you for any specific questions about what was found during the examination.  If the procedure report does not answer your questions, please call your gastroenterologist to clarify.  If you requested that your care partner not be given the details of your procedure findings, then the procedure report has been included in a sealed envelope for you to review at your convenience later.  YOU SHOULD EXPECT: Some feelings of bloating in the abdomen. Passage of more gas than usual.  Walking can help get rid of the air that was put into your GI tract during the procedure and reduce the bloating. If you had a lower endoscopy (such as a colonoscopy or flexible sigmoidoscopy) you may notice spotting of blood in your stool or on the toilet paper. If you underwent a bowel prep for your procedure, you may not have a normal bowel movement for a few days.  Please Note:  You might notice some irritation and congestion in your nose or some drainage.  This is from the oxygen used during your procedure.  There is no need for concern and it should clear up in a day or so.  SYMPTOMS TO REPORT IMMEDI   Following upper endoscopy (EGD)  Vomiting of blood or coffee ground material  New chest pain or pain under the shoulder blades  Painful or persistently difficult swallowing  New shortness of breath  Fever of 100F or higher  Black, tarry-looking stools  For urgent or emergent issues, a gastroenterologist can be reached at any hour by calling [phone]. Do not use MyChart messaging for urgent concerns.     DIET:  We do recommend a small meal at first, but then you may proceed to your regular diet.  Drink plenty of fluids but you should avoid alcoholic beverages for 24 hours.  ACTIVITY:  You should plan to take it easy for the rest of today and you should NOT DRIVE or use heavy machinery until tomorrow (because of the sedation medicines used during the test).    FOLLOW UP: Our staff will call the number listed on your records 48-72 hours following your procedure to check on you and address any questions or concerns that you may have regarding the information given to you following your procedure. If we do not reach you, we will leave a message.  We will attempt to reach you two times.  During this call, we will ask if you have developed any symptoms of COVID 19. If you develop any symptoms (ie: fever, flu-like symptoms, shortness of breath, cough etc.) before then, please call [phone].  If you test positive for Covid 19 in the 2 weeks post procedure, please call and report this information to [place].    If any biopsies were taken you will be contacted by phone or by letter within the next 1-3 weeks.  Please call us at [phone] if you have not heard about the biopsies in 3 weeks.    SIGNATURES/CONFIDENTIALITY: You and/or your care partner have signed paperwork which will be entered into your electronic medical record.  These signatures attest to  the fact that that the information above on your After Visit Summary has been reviewed and is understood.  Full responsibility of the confidentiality of this discharge information lies with you and/or your care-partner.

## 2021-03-08 NOTE — Progress Notes (Signed)
Report to PACU, RN, vss, BBS= Clear.  

## 2021-03-08 NOTE — Op Note (Signed)
[place] Patient Name: Noah [name] Procedure Date: 03/08/2021 9:36 AM MRN: [id number] Endoscopist: Milus Banister , MD Age: 71 Referring MD:  Date of Birth: 04-25-1950 Gender: Male Account #: [id number] Procedure:                Upper GI endoscopy Indications:              Dyspepsia, Heartburn Medicines:                Monitored Anesthesia Care Procedure:                Pre-Anesthesia Assessment:                           - Prior to the procedure, a History and Physical                            was performed, and patient medications and                            allergies were reviewed. The patient's tolerance of                            previous anesthesia was also reviewed. The risks                            and benefits of the procedure and the sedation                            options and risks were discussed with the patient.                            All questions were answered, and informed consent                            was obtained. Prior Anticoagulants: The patient has                            taken no previous anticoagulant or antiplatelet                            agents. ASA Grade Assessment: II - A patient with                            mild systemic disease. After reviewing the risks                            and benefits, the patient was deemed in                            satisfactory condition to undergo the procedure.                           After obtaining informed consent, the endoscope was  passed under direct vision. Throughout the                            procedure, the patient's blood pressure, pulse, and                            oxygen saturations were monitored continuously. The                            Endoscope was introduced through the mouth, and                            advanced to the second part of duodenum. The upper                            GI endoscopy was accomplished  without difficulty.                            The patient tolerated the procedure well. Scope In: Scope Out: Findings:                 Mild inflammation characterized by erythema,                            friability and granularity was found in the entire                            examined stomach. Biopsies were taken with a cold                            forceps for histology.                           The exam was otherwise without abnormality. Complications:            No immediate complications. Estimated blood loss:                            None. Estimated Blood Loss:     Estimated blood loss: none. Impression:               - Mild, non-specific distal gatritis. Biopsied to                            check for H. pylori.                           - The examination was otherwise normal. Recommendation:           - Patient has a contact number available for                            emergencies. The signs and symptoms of potential                            delayed complications were discussed with the  patient. Return to normal activities tomorrow.                            Written discharge instructions were provided to the                            patient.                           - Resume previous diet.                           - Continue present medications. Please start taking                            your pepcid (or generic famotidine) 20mg  pills, one                            pill at bedtime every night.                           - Await pathology results. Milus Banister, MD 03/08/2021 9:52:00 AM This report has been signed electronically.

## 2021-03-10 ENCOUNTER — Telehealth: Payer: Self-pay

## 2021-03-10 ENCOUNTER — Telehealth: Payer: Self-pay | Admitting: *Deleted

## 2021-03-10 NOTE — Telephone Encounter (Signed)
Left message on follow up call. 

## 2021-03-10 NOTE — Telephone Encounter (Signed)
°  Follow up Call-  Call back number 03/08/2021 05/23/2020  Post procedure Call Back phone  # [phone]  Permission to leave phone message Yes Yes  Some recent data might be hidden     Patient questions:  Do you have a fever, pain , or abdominal swelling? No. Pain Score  0 *  Have you tolerated food without any problems? Yes.    Have you been able to return to your normal activities? Yes.    Do you have any questions about your discharge instructions: Diet   no Medications  No. Follow up visit  No   Do you have questions or concerns about your Care? No.  Actions: * If pain score is 4 or above: No action needed, pain <4.  Have you developed a fever since your procedure? no  2.   Have you had an respiratory symptoms (SOB or cough) since your procedure? no  3.   Have you tested positive for COVID 19 since your procedure no  4.   Have you had any family members/close contacts diagnosed with the COVID 19 since your procedure?  no   If yes to any of these questions please route to Joylene John, RN and Joella Prince, RN

## 2021-03-12 ENCOUNTER — Encounter: Payer: Self-pay | Admitting: Gastroenterology

## 2021-03-17 ENCOUNTER — Encounter: Payer: Self-pay | Admitting: Gastroenterology

## 2021-03-21 ENCOUNTER — Encounter: Payer: Self-pay | Admitting: Interventional Cardiology

## 2021-03-21 MED ORDER — METOPROLOL SUCCINATE ER 25 MG PO TB24: 25.0000 mg | ORAL_TABLET | Freq: Two times a day (BID) | ORAL | 3 refills | Status: DC

## 2021-03-21 MED ORDER — ROSUVASTATIN CALCIUM 20 MG PO TABS: ORAL_TABLET | ORAL | 3 refills | Status: DC

## 2021-03-27 MED ORDER — ROSUVASTATIN CALCIUM 20 MG PO TABS: ORAL_TABLET | ORAL | 3 refills | Status: DC

## 2021-03-27 NOTE — Addendum Note (Signed)
Addended by: Carter Kitten D on: 03/27/2021 08:07 AM   Modules accepted: Orders

## 2021-04-10 ENCOUNTER — Other Ambulatory Visit: Payer: Self-pay | Admitting: Interventional Cardiology

## 2021-05-01 ENCOUNTER — Encounter: Payer: Self-pay | Admitting: Interventional Cardiology

## 2021-05-11 ENCOUNTER — Encounter: Payer: Self-pay | Admitting: Interventional Cardiology

## 2021-05-11 DIAGNOSIS — R0602 Shortness of breath: Secondary | ICD-10-CM

## 2021-05-12 ENCOUNTER — Telehealth: Payer: Self-pay | Admitting: Interventional Cardiology

## 2021-05-12 DIAGNOSIS — R7301 Impaired fasting glucose: Secondary | ICD-10-CM

## 2021-05-12 DIAGNOSIS — E782 Mixed hyperlipidemia: Secondary | ICD-10-CM

## 2021-05-12 DIAGNOSIS — I2510 Atherosclerotic heart disease of native coronary artery without angina pectoris: Secondary | ICD-10-CM

## 2021-05-12 NOTE — Telephone Encounter (Signed)
I spoke with patient who reports he usually has lab work checked week before seeing Dr Irish Lack.  Appointment is 10/05/21.  Will check with Dr Irish Lack to see what lab work is needed ?

## 2021-05-12 NOTE — Telephone Encounter (Signed)
Pt said that he normally get labs done a week before his f/u with Dr. Irish Lack but there was no order on file. ?

## 2021-05-17 NOTE — Telephone Encounter (Signed)
[name], MD  You 4 minutes ago (4:57 PM)  ? ?He typically gets the NMR test per PharmD.  Would want to see CMet as well.  A1C reasonable as he has had impaired fasting glucose in the past  ? ?

## 2021-05-19 NOTE — Telephone Encounter (Signed)
I spoke with patient.  He will come in for fasting lab work on 09/28/21 ?

## 2021-05-22 NOTE — Telephone Encounter (Signed)
Patient called to schedule an appt in regards to this. Dr. Irish Lack nor a PA have availability for this month at this time. Patient wants to know if a nurse visit for just an EKG should be scheduled for this month instead. Best callback number [phone]. Please advise.  ?

## 2021-05-23 ENCOUNTER — Ambulatory Visit (INDEPENDENT_AMBULATORY_CARE_PROVIDER_SITE_OTHER): Payer: Medicare Other | Admitting: Cardiology

## 2021-05-23 VITALS — BP 122/76 | HR 68 | Wt 174.0 lb

## 2021-05-23 DIAGNOSIS — R9431 Abnormal electrocardiogram [ECG] [EKG]: Secondary | ICD-10-CM

## 2021-05-23 NOTE — Progress Notes (Signed)
? ?  Nurse Visit  ? ?Date of Encounter: 05/23/2021 ?ID: Noah [name], DOB [birth year], MRN [id number] ? ?PCP:  Lawerance Cruel, MD ?  ?[facility] ?Cardiologist:  Larae Grooms, MD    ? ? ?Visit Details  ? ?VS:  BP 122/76 (BP Location: Left Arm, Patient Position: Sitting, Cuff Size: Normal)   Pulse 68   Wt 174 lb (78.9 kg)   BMI 27.25 kg/m?  , BMI Body mass index is 27.25 kg/m?. ? ?Wt Readings from Last 3 Encounters:  ?05/23/21 174 lb (78.9 kg)  ?03/08/21 164 lb (74.4 kg)  ?03/07/21 174 lb (78.9 kg)  ?  ? ?Reason for visit: EKG ?Performed today:   , Vitals, EKG, Provider consulted: , and Education ?Changes (medications, testing, etc.) : none ?Length of Visit: 30 minutes ? ? ? ?Medications Adjustments/Labs and Tests Ordered: ?Orders Placed This Encounter  ?Procedures  ? EKG 12-Lead  ? ?No orders of the defined types were placed in this encounter. ? ?EKG performed to compare to previous per Dr. Irish Lack.  Reviewed by Dr. Radford Pax.  Per Dr. Radford Pax EKG is unchanged from previous EKG 03/07/2021. ? ?Signed, ?Rodman Key, RN  ?05/23/2021 3:19 PM ? ?

## 2021-05-29 ENCOUNTER — Other Ambulatory Visit: Payer: Self-pay | Admitting: *Deleted

## 2021-05-29 MED ORDER — ROSUVASTATIN CALCIUM 20 MG PO TABS: ORAL_TABLET | ORAL | 2 refills | Status: DC

## 2021-05-30 ENCOUNTER — Other Ambulatory Visit: Payer: Self-pay | Admitting: Interventional Cardiology

## 2021-05-31 ENCOUNTER — Telehealth: Payer: Self-pay | Admitting: Interventional Cardiology

## 2021-05-31 NOTE — Telephone Encounter (Signed)
New Message: ? ? ? ?Patient called and said [facility] wanted his Cardiology office to call them. He said they gave him this Order# [id number] and their phone number to tell you and that was it. ?

## 2021-05-31 NOTE — Telephone Encounter (Signed)
Called [facility] mail order pharmacy to inform them that they needed to reach out to the pt to inform them that they were unable to refill medication from the [employer] manufacturer to see pt is ok with that and if not we could resent to a local pharmacy. Pharmacist stated that they would reach out to the pt. Verbalized understanding.  ?

## 2021-06-15 ENCOUNTER — Ambulatory Visit (HOSPITAL_COMMUNITY): Payer: Medicare Other | Attending: Cardiology

## 2021-06-15 DIAGNOSIS — R0602 Shortness of breath: Secondary | ICD-10-CM

## 2021-06-15 LAB — ECHOCARDIOGRAM COMPLETE
Area-P 1/2: 4.31 cm2
S' Lateral: 2.7 cm

## 2021-07-05 ENCOUNTER — Encounter: Payer: Self-pay | Admitting: Interventional Cardiology

## 2021-09-28 ENCOUNTER — Other Ambulatory Visit: Payer: Medicare Other

## 2021-10-05 ENCOUNTER — Ambulatory Visit: Payer: Medicare Other | Admitting: Interventional Cardiology

## 2021-10-05 ENCOUNTER — Other Ambulatory Visit: Payer: Medicare Other

## 2021-10-12 ENCOUNTER — Other Ambulatory Visit: Payer: Medicare Other

## 2021-10-12 DIAGNOSIS — E782 Mixed hyperlipidemia: Secondary | ICD-10-CM

## 2021-10-12 DIAGNOSIS — I2510 Atherosclerotic heart disease of native coronary artery without angina pectoris: Secondary | ICD-10-CM

## 2021-10-12 DIAGNOSIS — R7301 Impaired fasting glucose: Secondary | ICD-10-CM

## 2021-10-13 LAB — NMR, LIPOPROFILE
Cholesterol, Total: 140 mg/dL (ref 100–199)
HDL Particle Number: 35.6 umol/L (ref >=30.5)
HDL-C: 54 mg/dL (ref >39)
LDL Particle Number: 1079 nmol/L — ABNORMAL HIGH (ref <1000)
LDL Size: 20.4 nm — ABNORMAL LOW (ref >20.5)
LDL-C (NIH Calc): 66 mg/dL (ref 0–99)
LP-IR Score: 46 — ABNORMAL HIGH (ref <=45)
Small LDL Particle Number: 652 nmol/L — ABNORMAL HIGH (ref <=527)
Triglycerides: 109 mg/dL (ref 0–149)

## 2021-10-13 LAB — COMPREHENSIVE METABOLIC PANEL
ALT: 30 IU/L (ref 0–44)
AST: 28 IU/L (ref 0–40)
Albumin/Globulin Ratio: 1.8 (ref 1.2–2.2)
Albumin: 4.6 g/dL (ref 3.8–4.8)
Alkaline Phosphatase: 83 IU/L (ref 44–121)
BUN/Creatinine Ratio: 12 (ref 10–24)
BUN: 13 mg/dL (ref 8–27)
Bilirubin Total: 0.7 mg/dL (ref 0.0–1.2)
CO2: 25 mmol/L (ref 20–29)
Calcium: 9.5 mg/dL (ref 8.6–10.2)
Chloride: 102 mmol/L (ref 96–106)
Creatinine, Ser: 1.1 mg/dL (ref 0.76–1.27)
Globulin, Total: 2.6 g/dL (ref 1.5–4.5)
Glucose: 94 mg/dL (ref 70–99)
Potassium: 4.4 mmol/L (ref 3.5–5.2)
Sodium: 141 mmol/L (ref 134–144)
Total Protein: 7.2 g/dL (ref 6.0–8.5)
eGFR: 71 mL/min/{1.73_m2} (ref >59)

## 2021-10-13 LAB — APOLIPOPROTEIN B: Apolipoprotein B: 82 mg/dL (ref <90)

## 2021-10-13 LAB — HEMOGLOBIN A1C
Est. average glucose Bld gHb Est-mCnc: 108 mg/dL
Hgb A1c MFr Bld: 5.4 % (ref 4.8–5.6)

## 2021-10-16 NOTE — Progress Notes (Signed)
Cardiology Office Note   Date:  10/17/2021   ID:  Noah [name], DOB 21-Dec-1950, MRN [id number]  PCP:  Daisy Floro, MD    No chief complaint on file.  CAD  Wt Readings from Last 3 Encounters:  10/17/21 173 lb (78.5 kg)  05/23/21 174 lb (78.9 kg)  03/08/21 164 lb (74.4 kg)       History of Present Illness: Noah [name] is a 71 y.o. male   with coronary artery disease status post CABG in 2007, remote paroxysmal atrial fibrillation (noted many years ago), hyperlipidemia (intolerant of some statins).   In 2/19,  He has had some palpitations.  48-hour Holter monitor demonstrated normal rhythm, PVCs and PACs.  There was no atrial fibrillation noted.    He was evaluated for palpitations in the office in   9/19. He was under stress.  He was taking an extra half of metoprolol with less benefit than usual.   Echocardiogram 06/10/2017 Mild LVH, EF 65-70, normal wall motion, grade 1 diastolic dysfunction, aortic sclerosis without stenosis, trivial AI, mild LAE   48-hour Holter 04/01/2017 Normal sinus rhythm, PACs and PVCs correlate with symptoms; no significant arrhythmias   Nuclear stress test 10/23/2016 EF 67, no ischemia, low risk   Carotid US 04/27/2015 Bilateral ICA 1-39; follow-up as needed   In the past, he has had some flucuating heart rates while exercising.  He tolerates riding the bike well.  In 2020, he increased his bike duration and decreased carbs.  His lipids were improved in 12/2018.   Got Pfizer [place] vaccine.   He had issues with loss of balance and a fall in July 2021.  He was stepping over a child protective gate at the base of the stairs.  He got stressed and went to ER. Troponins were negative. BNP negative.  CT head was negative for any trauma.     He was referred to neurology.  He also reported some increased HR.  EMS has done an ECG and he was told he had NSR with PVCs.  Cr 1.07.  He has had some episodes where is resting HR has been high as  well.   In 2021, reported: "He continues to have frequent urination and dizziness.  He feels that his HR is going up faster with exercise.  He is sweating more. No SHOB or chest pain with exercise.   Still has some orthostatic sx when getting up from the floor after doing exercises.   Had an episode where he started tilting to the left when he was walking."     In 5/22, he wanted a CK drawn after sending the following: " I want to see if the problems with both shoulders is due to the statins I've been taking.  It's a situation that I think started just about when I switched from Lipitor to  Crestor back around 2013/2014.  So my thought is maybe it is the Crestor because I did not have any injuries from falls or sports or anything  for that matter."   He has developed tinnitus.  Saw ENT.  Now trying hearing aids.   Normal nuclear stress test in March 2022.   He had chest discomfort after overeating at a birthday in [place] in late 2022.  Symptoms resolved with Pepcid AC.    He had COVID in Feb 2023.  July 2023 had another URI.  Negative for COVD.  Lipids have increased.   CXR was  negative. He has 4 grandkids.   Denies : Chest pain. Dizziness. Leg edema. Nitroglycerin use. Orthopnea. Palpitations. Paroxysmal nocturnal dyspnea. Shortness of breath. Syncope.    He maintained exercise without any problems.  Continued to ride his stationary bike.   Past Medical History:  Diagnosis Date   Allergy    seasonal- dust mites , tree pollen - worse in Spring    Anxiety    Arthritis    Coronary artery disease 07/13/2005   CABG   DDD (degenerative disc disease)    DJD (degenerative joint disease)    Elevated liver enzymes    GERD (gastroesophageal reflux disease)    Hemangioma of liver    Hemorrhoids    Hyperlipemia    Hypertension    PAC (premature atrial contraction)    Paroxysmal atrial fibrillation (HCC)    1 run of A Fib only- after heart surgery    Pneumonia    PVC (premature  ventricular contraction)     Past Surgical History:  Procedure Laterality Date   COLONOSCOPY  05/2020   CORONARY ARTERY BYPASS GRAFT  07/13/2005   INGUINAL HERNIA REPAIR Right 1978, 2000   x 2   KNEE SURGERY Right    SEPTOPLASTY       Current Outpatient Medications  Medication Sig Dispense Refill   aspirin EC 81 MG tablet Take 1 tablet (81 mg total) by mouth daily.     calcium carbonate (TUMS - DOSED IN MG ELEMENTAL CALCIUM) 500 MG chewable tablet Chew 1 tablet by mouth daily as needed.     Cholecalciferol (VITAMIN D) 2000 UNITS tablet Take 2,000 Units by mouth daily.     famotidine (PEPCID) 20 MG tablet Take 1 tablet (20 mg total) by mouth at bedtime. 90 tablet 3   Flaxseed, Linseed, POWD Take 2 scoop by mouth as directed.     Ganciclovir (ZIRGAN) 0.15 % GEL 1 application. As needed for cold sores     ibuprofen (ADVIL,MOTRIN) 200 MG tablet Take 200 mg by mouth every 6 (six) hours as needed.     LORazepam (ATIVAN) 0.5 MG tablet Take 1 tablet by mouth at bedtime as needed for sleep. To help with sleep     meclizine (ANTIVERT) 25 MG tablet Take 25 mg by mouth 3 (three) times daily as needed for dizziness.     metoprolol succinate (TOPROL-XL) 25 MG 24 hr tablet Take 1 tablet (25 mg total) by mouth 2 (two) times daily. 180 tablet 3   metoprolol tartrate (LOPRESSOR) 25 MG tablet TAKE 1/2 -1 TABLET BY MOUTH AS NEEDED 90 tablet 2   Multiple Vitamin (MULTIVITAMIN WITH MINERALS) TABS Take 1 tablet by mouth daily. Centrum silver     nitroGLYCERIN (NITROSTAT) 0.4 MG SL tablet Place 1 tablet (0.4 mg total) under the tongue every 5 (five) minutes as needed for chest pain (3 DOSES MAX). 25 tablet 6   pramoxine-hydrocortisone (ANALPRAM HC) cream Apply 1 application topically at bedtime as needed (for skin).      Psyllium (METAMUCIL PO) 1 tablespoons by mouth as needed, for consitpation     rosuvastatin (CRESTOR) 20 MG tablet TAKE 1 TABLET DAILY 90 tablet 2   Ubiquinol 100 MG CAPS Take 100 mg by  mouth daily.     No current facility-administered medications for this visit.   Facility-Administered Medications Ordered in Other Visits  Medication Dose Route Frequency Provider Last Rate Last Admin   regadenoson (LEXISCAN) injection SOLN 0.4 mg  0.4 mg Intravenous Once Nahser, Deloris Ping, MD  Allergies:   Cephalexin, Levofloxacin, Zoster vac recomb adjuvanted, Clarithromycin, Penicillins, Ciprofloxacin, Doxycycline, and Lipitor [atorvastatin]    Social History:  The patient  reports that he quit smoking about 47 years ago. His smoking use included cigarettes. He has never used smokeless tobacco. He reports current alcohol use. He reports that he does not use drugs.   Family History:  The patient's family history includes Atrial fibrillation (age of onset: 21) in his mother; Colon polyps in his mother; Coronary artery disease in his father; Heart attack in his father, maternal grandfather, paternal grandfather, and paternal grandmother; Prostate cancer in his maternal uncle; Seizures in his brother.    ROS:  Please see the history of present illness.   Otherwise, review of systems are positive for recent cough.   All other systems are reviewed and negative.    PHYSICAL EXAM: VS:  BP 122/78   Pulse 68   Ht 5\' 7"  (1.702 m)   Wt 173 lb (78.5 kg)   SpO2 98%   BMI 27.10 kg/m  , BMI Body mass index is 27.1 kg/m. GEN: Well nourished, well developed, in no acute distress HEENT: normal Neck: no JVD, carotid bruits, or masses Cardiac: RRR; no murmurs, rubs, or gallops,no edema  Respiratory:  clear to auscultation bilaterally, normal work of breathing GI: soft, nontender, nondistended, + BS MS: no deformity or atrophy Skin: warm and dry, no rash Neuro:  Strength and sensation are intact Psych: euthymic mood, full affect   EKG:   The ekg ordered today demonstrates NSR, LAFB   Recent Labs: 10/19/2020: TSH 3.210 10/12/2021: ALT 30; BUN 13; Creatinine, Ser 1.10; Potassium 4.4;  Sodium 141   Lipid Panel    Component Value Date/Time   CHOL 116 01/23/2017 1030   CHOL 121 02/09/2016 1206   CHOL 124 07/28/2014 0903   TRIG 108 01/23/2017 1030   TRIG 112 07/28/2014 0903   HDL 46 01/23/2017 1030   HDL 46 07/28/2014 0903   CHOLHDL 2.5 02/09/2016 1206   LDLCALC 53 02/09/2016 1206   LDLCALC 56 07/28/2014 0903     Other studies Reviewed: Additional studies/ records that were reviewed today with results demonstrating: lipids reviewed- particle number increased.   ASSESSMENT AND PLAN:  CAD: h/o CABG. No angina on medical therapy.  The current medical regimen is effective;  continue present plan and medications. HTN: The current medical regimen is effective;  continue present plan and medications.   PreDM: High fiber diet.  Whole food, plant based diet.   Hyperlipidemia: Will repeat lipids in several months after a better diet.  Due to multiple grandkids having birthdays along with a vacation, he was off of his usual diet. He feels he is getting better from his respiratory illness.     Current medicines are reviewed at length with the patient today.  The patient concerns regarding his medicines were addressed.  The following changes have been made:  No change  Labs/ tests ordered today include:  No orders of the defined types were placed in this encounter.   Recommend 150 minutes/week of aerobic exercise Low fat, low carb, high fiber diet recommended  Disposition:   FU in 6 months   Signed, Lance Muss, MD  10/17/2021 1:47 PM    [facility] [address] [place], [place], [place]  [address] Phone: [phone]; Fax: [phone]

## 2021-10-17 ENCOUNTER — Ambulatory Visit: Payer: Medicare Other | Attending: Interventional Cardiology | Admitting: Interventional Cardiology

## 2021-10-17 VITALS — BP 122/78 | HR 68 | Ht 67.0 in | Wt 173.0 lb

## 2021-10-17 DIAGNOSIS — I25118 Atherosclerotic heart disease of native coronary artery with other forms of angina pectoris: Secondary | ICD-10-CM | POA: Diagnosis not present

## 2021-10-17 DIAGNOSIS — Z951 Presence of aortocoronary bypass graft: Secondary | ICD-10-CM

## 2021-10-17 DIAGNOSIS — R7303 Prediabetes: Secondary | ICD-10-CM

## 2021-10-17 DIAGNOSIS — I10 Essential (primary) hypertension: Secondary | ICD-10-CM

## 2021-10-17 DIAGNOSIS — E782 Mixed hyperlipidemia: Secondary | ICD-10-CM

## 2021-10-17 DIAGNOSIS — R351 Nocturia: Secondary | ICD-10-CM

## 2021-10-17 NOTE — Patient Instructions (Signed)
Medication Instructions:  Your physician recommends that you continue on your current medications as directed. Please refer to the Current Medication list given to you today.  *If you need a refill on your cardiac medications before your next appointment, please call your pharmacy*   Lab Work: Your physician recommends that you return for lab work on January 08, 2022.  PSA, NMR, Apolipoprotein B, CMET.  This will be fasting.  The lab opens at 7:15 AM  If you have labs (blood work) drawn today and your tests are completely normal, you will receive your results only by: [facility] (if you have MyChart) OR A paper copy in the mail If you have any lab test that is abnormal or we need to change your treatment, we will call you to review the results.   Testing/Procedures: none   Follow-Up: At [facility], you and your health needs are our priority.  As part of our continuing mission to provide you with exceptional heart care, we have created designated Provider Care Teams.  These Care Teams include your primary Cardiologist (physician) and Advanced Practice Providers (APPs -  Physician Assistants and Nurse Practitioners) who all work together to provide you with the care you need, when you need it.  We recommend signing up for the patient portal called "MyChart".  Sign up information is provided on this After Visit Summary.  MyChart is used to connect with patients for Virtual Visits (Telemedicine).  Patients are able to view lab/test results, encounter notes, upcoming appointments, etc.  Non-urgent messages can be sent to your provider as well.   To learn more about what you can do with MyChart, go to [email or web address].    Your next appointment:   Apr 19, 2022 at 9:40  The format for your next appointment:   In Person  Provider:   Larae Grooms, MD     Other Instructions    Important Information About Sugar

## 2021-11-15 ENCOUNTER — Encounter: Payer: Self-pay | Admitting: Interventional Cardiology

## 2021-12-26 ENCOUNTER — Other Ambulatory Visit: Payer: Self-pay | Admitting: Interventional Cardiology

## 2022-01-08 ENCOUNTER — Ambulatory Visit: Payer: Medicare Other | Attending: Interventional Cardiology

## 2022-01-08 DIAGNOSIS — I10 Essential (primary) hypertension: Secondary | ICD-10-CM

## 2022-01-08 DIAGNOSIS — I25118 Atherosclerotic heart disease of native coronary artery with other forms of angina pectoris: Secondary | ICD-10-CM

## 2022-01-08 DIAGNOSIS — E782 Mixed hyperlipidemia: Secondary | ICD-10-CM

## 2022-01-08 DIAGNOSIS — Z951 Presence of aortocoronary bypass graft: Secondary | ICD-10-CM

## 2022-01-08 DIAGNOSIS — R351 Nocturia: Secondary | ICD-10-CM

## 2022-01-09 LAB — COMPREHENSIVE METABOLIC PANEL
ALT: 26 IU/L (ref 0–44)
AST: 28 IU/L (ref 0–40)
Albumin/Globulin Ratio: 1.8 (ref 1.2–2.2)
Albumin: 4.5 g/dL (ref 3.8–4.8)
Alkaline Phosphatase: 81 IU/L (ref 44–121)
BUN/Creatinine Ratio: 17 (ref 10–24)
BUN: 19 mg/dL (ref 8–27)
Bilirubin Total: 0.5 mg/dL (ref 0.0–1.2)
CO2: 24 mmol/L (ref 20–29)
Calcium: 9.7 mg/dL (ref 8.6–10.2)
Chloride: 104 mmol/L (ref 96–106)
Creatinine, Ser: 1.1 mg/dL (ref 0.76–1.27)
Globulin, Total: 2.5 g/dL (ref 1.5–4.5)
Glucose: 83 mg/dL (ref 70–99)
Potassium: 4.4 mmol/L (ref 3.5–5.2)
Sodium: 141 mmol/L (ref 134–144)
Total Protein: 7 g/dL (ref 6.0–8.5)
eGFR: 71 mL/min/{1.73_m2} (ref >59)

## 2022-01-09 LAB — NMR, LIPOPROFILE
Cholesterol, Total: 132 mg/dL (ref 100–199)
HDL Particle Number: 36.4 umol/L (ref >=30.5)
HDL-C: 51 mg/dL (ref >39)
LDL Particle Number: 794 nmol/L (ref <1000)
LDL Size: 20.1 nm — ABNORMAL LOW (ref >20.5)
LDL-C (NIH Calc): 63 mg/dL (ref 0–99)
LP-IR Score: 25 (ref <=45)
Small LDL Particle Number: 420 nmol/L (ref <=527)
Triglycerides: 95 mg/dL (ref 0–149)

## 2022-01-09 LAB — PSA: Prostate Specific Ag, Serum: 0.5 ng/mL (ref 0.0–4.0)

## 2022-01-09 LAB — APOLIPOPROTEIN B: Apolipoprotein B: 78 mg/dL (ref <90)

## 2022-01-16 ENCOUNTER — Encounter: Payer: Self-pay | Admitting: Interventional Cardiology

## 2022-01-17 ENCOUNTER — Ambulatory Visit (INDEPENDENT_AMBULATORY_CARE_PROVIDER_SITE_OTHER): Payer: Medicare Other

## 2022-01-17 ENCOUNTER — Ambulatory Visit: Payer: Medicare Other | Attending: Cardiology | Admitting: *Deleted

## 2022-01-17 VITALS — BP 132/74 | HR 85 | Ht 68.0 in | Wt 169.0 lb

## 2022-01-17 DIAGNOSIS — I480 Paroxysmal atrial fibrillation: Secondary | ICD-10-CM

## 2022-01-17 DIAGNOSIS — I493 Ventricular premature depolarization: Secondary | ICD-10-CM

## 2022-01-17 NOTE — Progress Notes (Signed)
   Nurse Visit   Date of Encounter: 01/17/2022 ID: Noah [name], DOB Jun 22, 1950, MRN [id number]  PCP:  Lawerance Cruel, [place] [facility] Cardiologist:  Larae Grooms, MD      Visit Details   VS:  There were no vitals taken for this visit. , BMI There is no height or weight on file to calculate BMI.  Wt Readings from Last 3 Encounters:  10/17/21 173 lb (78.5 kg)  05/23/21 174 lb (78.9 kg)  03/08/21 164 lb (74.4 kg)     Reason for visit: Pulse oximeter showing high and low heart rate readings Performed today: Vitals, EKG, Provider consulted:Dr Turner, and Education Changes (medications, testing, etc.) : 2 week zio ordered Length of Visit: 25 minutes    Medications Adjustments/Labs and Tests Ordered: Orders Placed This Encounter  Procedures   LONG TERM MONITOR (3-14 DAYS)   EKG 12-Lead   No orders of the defined types were placed in this encounter.    Signed, Leodis Liverpool, RN  01/17/2022 1:43 PM

## 2022-01-17 NOTE — Progress Notes (Unsigned)
Applied a 14 day Zio XT monitor to patient in the office.  Dr Irish Lack to read

## 2022-01-17 NOTE — Progress Notes (Signed)
Patient here for EKG based on high and low heart rate readings on pulse oximeter.  EKG reviewed with Dr Radford Pax.  EKG shows SR with PVCs 2 week zio patch ordered and applied prior to patient leaving office

## 2022-02-09 ENCOUNTER — Encounter: Payer: Self-pay | Admitting: Interventional Cardiology

## 2022-03-01 ENCOUNTER — Encounter: Payer: Self-pay | Admitting: Interventional Cardiology

## 2022-03-25 ENCOUNTER — Other Ambulatory Visit: Payer: Self-pay | Admitting: Interventional Cardiology

## 2022-04-13 ENCOUNTER — Encounter: Payer: Self-pay | Admitting: Interventional Cardiology

## 2022-04-19 ENCOUNTER — Ambulatory Visit
Admission: RE | Admit: 2022-04-19 | Discharge: 2022-04-19 | Disposition: A | Discharge status: Home / Self Care | Payer: Medicare Other | Source: Ambulatory Visit | Attending: Internal Medicine | Admitting: Internal Medicine

## 2022-04-19 ENCOUNTER — Ambulatory Visit: Payer: Medicare Other | Admitting: Interventional Cardiology

## 2022-04-19 ENCOUNTER — Encounter: Payer: Self-pay | Admitting: Interventional Cardiology

## 2022-04-19 VITALS — BP 126/70 | HR 70 | Ht 67.5 in | Wt 169.0 lb

## 2022-04-19 DIAGNOSIS — I491 Atrial premature depolarization: Secondary | ICD-10-CM

## 2022-04-19 DIAGNOSIS — I10 Essential (primary) hypertension: Secondary | ICD-10-CM | POA: Insufficient documentation

## 2022-04-19 DIAGNOSIS — I25118 Atherosclerotic heart disease of native coronary artery with other forms of angina pectoris: Secondary | ICD-10-CM | POA: Diagnosis present

## 2022-04-19 DIAGNOSIS — M7989 Other specified soft tissue disorders: Secondary | ICD-10-CM | POA: Insufficient documentation

## 2022-04-19 DIAGNOSIS — E782 Mixed hyperlipidemia: Secondary | ICD-10-CM | POA: Insufficient documentation

## 2022-04-19 DIAGNOSIS — Z951 Presence of aortocoronary bypass graft: Secondary | ICD-10-CM | POA: Diagnosis present

## 2022-04-19 NOTE — Progress Notes (Signed)
Cardiology Office Note   Date:  04/19/2022   ID:  Noah [name], DOB [birth year], MRN [id number]  PCP:  Lawerance Cruel, MD    No chief complaint on file.  CAD  Wt Readings from Last 3 Encounters:  04/19/22 169 lb (76.7 kg)  01/17/22 169 lb (76.7 kg)  10/17/21 173 lb (78.5 kg)       History of Present Illness: Noah [name] is a 72 y.o. male    with coronary artery disease status post CABG in 2007, remote paroxysmal atrial fibrillation (noted many years ago), hyperlipidemia (intolerant of some statins).   In 2/19,  He has had some palpitations.  48-hour Holter monitor demonstrated normal rhythm, PVCs and PACs.  There was no atrial fibrillation noted.    He was evaluated for palpitations in the office in   9/19. He was under stress.  He was taking an extra half of metoprolol with less benefit than usual.   Echocardiogram 06/10/2017 Mild LVH, EF 65-70, normal wall motion, grade 1 diastolic dysfunction, aortic sclerosis without stenosis, trivial AI, mild LAE   48-hour Holter 04/01/2017 Normal sinus rhythm, PACs and PVCs correlate with symptoms; no significant arrhythmias   Nuclear stress test 10/23/2016 EF 67, no ischemia, low risk   Carotid US 04/27/2015 Bilateral ICA 1-39; follow-up as needed   In the past, he has had some flucuating heart rates while exercising.  He tolerates riding the bike well.  In 2020, he increased his bike duration and decreased carbs.  His lipids were improved in 12/2018.   [place] vaccine.   He had issues with loss of balance and a fall in July 2021.  He was stepping over a child protective gate at the base of the stairs.  He got stressed and went to ER. Troponins were negative. BNP negative.  CT head was negative for any trauma.     He was referred to neurology.  He also reported some increased HR.  EMS has done an ECG and he was told he had NSR with PVCs.  Cr 1.07.  He has had some episodes where is resting HR has been high as  well.   In 2021, reported: "He continues to have frequent urination and dizziness.  He feels that his HR is going up faster with exercise.  He is sweating more. No SHOB or chest pain with exercise.   Still has some orthostatic sx when getting up from the floor after doing exercises.   Had an episode where he started tilting to the left when he was walking."     In 5/22, he wanted a CK drawn after sending the following: " I want to see if the problems with both shoulders is due to the statins I've been taking.  It's a situation that I think started just about when I switched from Lipitor to  Crestor back around 2013/2014.  So my thought is maybe it is the Crestor because I did not have any injuries from falls or sports or anything  for that matter."   He has developed tinnitus.  Saw ENT.  Now trying hearing aids.   Normal nuclear stress test in March 2022.   He had chest discomfort after overeating at a birthday in [place] in late 2022.  Symptoms resolved with Pepcid AC.     He had COVID in Feb 2023.  July 2023 had another URI.  Negative for COVD.  Lipids have increased.  12/23 montior showed: "Normal sinus rhythm with rare PACs and PVCs.   Brief episodes of SVT, up to 11 beats that were associated with symptoms.   PVCs were also associated with symptoms.   No atrial fibrillation.   No sustained arrhythmias."  Had some right ankle bruising after a flight to [place] in 2/24. Feels a swelling in the back of the  right knee and had difficulty bending the right knee.   Denies : Chest pain. Dizziness. Leg edema. Nitroglycerin use. Orthopnea. Palpitations. Paroxysmal nocturnal dyspnea. Shortness of breath. Syncope.    COntinues to ride stationary bike.    Past Medical History:  Diagnosis Date   Allergy    seasonal- dust mites , tree pollen - worse in Spring    Anxiety    Arthritis    Coronary artery disease 07/13/2005   CABG   DDD (degenerative disc disease)    DJD  (degenerative joint disease)    Elevated liver enzymes    GERD (gastroesophageal reflux disease)    Hemangioma of liver    Hemorrhoids    Hyperlipemia    Hypertension    PAC (premature atrial contraction)    Paroxysmal atrial fibrillation (HCC)    1 run of A Fib only- after heart surgery    Pneumonia    PVC (premature ventricular contraction)     Past Surgical History:  Procedure Laterality Date   COLONOSCOPY  05/2020   CORONARY ARTERY BYPASS GRAFT  07/13/2005   INGUINAL HERNIA REPAIR Right 1978, 2000   x 2   KNEE SURGERY Right    SEPTOPLASTY       Current Outpatient Medications  Medication Sig Dispense Refill   amoxicillin (AMOXIL) 500 MG capsule Take 500 mg by mouth every 8 (eight) hours.     aspirin EC 81 MG tablet Take 1 tablet (81 mg total) by mouth daily.     calcium carbonate (TUMS - DOSED IN MG ELEMENTAL CALCIUM) 500 MG chewable tablet Chew 1 tablet by mouth daily as needed.     Cholecalciferol (VITAMIN D) 2000 UNITS tablet Take 2,000 Units by mouth daily.     famotidine (PEPCID) 20 MG tablet Take 1 tablet (20 mg total) by mouth at bedtime. 90 tablet 3   Flaxseed, Linseed, POWD Take 2 scoop by mouth as directed.     Ganciclovir (ZIRGAN) A999333 % GEL 1 application. As needed for cold sores     ibuprofen (ADVIL,MOTRIN) 200 MG tablet Take 200 mg by mouth every 6 (six) hours as needed.     LORazepam (ATIVAN) 0.5 MG tablet Take 1 tablet by mouth at bedtime as needed for sleep. To help with sleep     meclizine (ANTIVERT) 25 MG tablet Take 25 mg by mouth 3 (three) times daily as needed for dizziness.     metoprolol succinate (TOPROL-XL) 25 MG 24 hr tablet TAKE 1 TABLET BY MOUTH TWICE  DAILY 180 tablet 1   metoprolol tartrate (LOPRESSOR) 25 MG tablet TAKE 1/2 -1 TABLET BY MOUTH AS NEEDED 90 tablet 2   Multiple Vitamin (MULTIVITAMIN WITH MINERALS) TABS Take 1 tablet by mouth daily. Centrum silver     nitroGLYCERIN (NITROSTAT) 0.4 MG SL tablet Place 1 tablet (0.4 mg total) under  the tongue every 5 (five) minutes as needed for chest pain (3 DOSES MAX). 25 tablet 6   Pediatric Multiple Vitamins (FLINTSTONES PLUS EXTRA C) CHEW Chew 1 tablet by mouth daily.     pramoxine-hydrocortisone (ANALPRAM HC) cream Apply 1 application topically  at bedtime as needed (for skin).      pramoxine-hydrocortisone (PROCTOCREAM-HC) 1-1 % rectal cream Apply topically.     Psyllium (METAMUCIL PO) 1 tablespoons by mouth as needed, for consitpation     rosuvastatin (CRESTOR) 20 MG tablet TAKE 1 TABLET BY MOUTH DAILY 90 tablet 2   tobramycin-dexamethasone (TOBRADEX) ophthalmic solution SMARTSIG:1-2 Drop(s) In Eye(s) Twice Daily PRN     Ubiquinol 100 MG CAPS Take 100 mg by mouth daily.     No current facility-administered medications for this visit.   Facility-Administered Medications Ordered in Other Visits  Medication Dose Route Frequency Provider Last Rate Last Admin   regadenoson (LEXISCAN) injection SOLN 0.4 mg  0.4 mg Intravenous Once Nahser, Wonda Cheng, MD        Allergies:   Cephalexin, Levofloxacin, Zoster vac recomb adjuvanted, Clarithromycin, Penicillins, Ciprofloxacin, Doxycycline, and Lipitor [atorvastatin]    Social History:  The patient  reports that he quit smoking about 48 years ago. His smoking use included cigarettes. He has never used smokeless tobacco. He reports current alcohol use. He reports that he does not use drugs.   Family History:  The patient's family history includes Atrial fibrillation (age of onset: 24) in his mother; Colon polyps in his mother; Coronary artery disease in his father; Heart attack in his father, maternal grandfather, paternal grandfather, and paternal grandmother; Prostate cancer in his maternal uncle; Seizures in his brother.    ROS:  Please see the history of present illness.   Otherwise, review of systems are positive for right knee pain and swelling.   All other systems are reviewed and negative.    PHYSICAL EXAM: VS:  BP 126/70   Pulse 70    Ht 5' 7.5" (1.715 m)   Wt 169 lb (76.7 kg)   SpO2 99%   BMI 26.08 kg/m  , BMI Body mass index is 26.08 kg/m. GEN: Well nourished, well developed, in no acute distress HEENT: normal Neck: no JVD, carotid bruits, or masses Cardiac: RRR; no murmurs, rubs, or gallops,no edema  Respiratory:  clear to auscultation bilaterally, normal work of breathing GI: soft, nontender, nondistended, + BS MS: no deformity or atrophy; back of right calf bigger than left Skin: warm and dry, no rash Neuro:  Strength and sensation are intact Psych: euthymic mood, full affect   Recent Labs: 01/08/2022: ALT 26; BUN 19; Creatinine, Ser 1.10; Potassium 4.4; Sodium 141   Lipid Panel    Component Value Date/Time   CHOL 116 01/23/2017 1030   CHOL 121 02/09/2016 1206   CHOL 124 07/28/2014 0903   TRIG 108 01/23/2017 1030   TRIG 112 07/28/2014 0903   HDL 46 01/23/2017 1030   HDL 46 07/28/2014 0903   CHOLHDL 2.5 02/09/2016 1206   LDLCALC 53 02/09/2016 1206   LDLCALC 56 07/28/2014 0903     Other studies Reviewed: Additional studies/ records that were reviewed today with results demonstrating: A1c 5.4 in August 2023.  HDL 51 in November 2023 triglycerides 95   ASSESSMENT AND PLAN:  CAD:s/p CABG. No angina.  Continue aggressive secondary prevention.  Leg swelling: Right LE edema.  Check for DVT>  may have a Bakers cyst. Has right knee arthritis as well.  Hyperlipidemia: Continue current lipid-lowering therapy.  LDL 63 in November 2023. Hypertension: Blood pressure well-controlled.  Continue current medical therapy. PACs: Symptoms controlled.  No AF on recent monitor.   Current medicines are reviewed at length with the patient today.  The patient concerns regarding his medicines were addressed.  The following changes have been made:  No change  Labs/ tests ordered today include:  No orders of the defined types were placed in this encounter.   Recommend 150 minutes/week of aerobic exercise Low  fat, low carb, high fiber diet recommended  Disposition:   FU in 8 months   Signed, Larae Grooms, MD  04/19/2022 9:59 AM    [facility], [place], [place]  [address] Phone: [phone]; Fax: [phone]

## 2022-04-19 NOTE — Patient Instructions (Signed)
Medication Instructions:  Your physician recommends that you continue on your current medications as directed. Please refer to the Current Medication list given to you today.  *If you need a refill on your cardiac medications before your next appointment, please call your pharmacy*   Lab Work: none If you have labs (blood work) drawn today and your tests are completely normal, you will receive your results only by: [place] (if you have MyChart) OR A paper copy in the mail If you have any lab test that is abnormal or we need to change your treatment, we will call you to review the results.   Testing/Procedures: Your physician has requested that you have a lower or upper extremity venous duplex. This test is an ultrasound of the veins in the legs or arms. It looks at venous blood flow that carries blood from the heart to the legs or arms. Allow one hour for a Lower Venous exam. Allow thirty minutes for an Upper Venous exam. There are no restrictions or special instructions.     Follow-Up: At [facility], you and your health needs are our priority.  As part of our continuing mission to provide you with exceptional heart care, we have created designated Provider Care Teams.  These Care Teams include your primary Cardiologist (physician) and Advanced Practice Providers (APPs -  Physician Assistants and Nurse Practitioners) who all work together to provide you with the care you need, when you need it.  We recommend signing up for the patient portal called "MyChart".  Sign up information is provided on this After Visit Summary.  MyChart is used to connect with patients for Virtual Visits (Telemedicine).  Patients are able to view lab/test results, encounter notes, upcoming appointments, etc.  Non-urgent messages can be sent to your provider as well.   To learn more about what you can do with MyChart, go to [email or web address].    Your next appointment:   7 -8  month(s)  Provider:   Larae Grooms, MD     Other Instructions

## 2022-04-23 ENCOUNTER — Encounter: Payer: Self-pay | Admitting: Interventional Cardiology

## 2022-04-30 ENCOUNTER — Encounter: Payer: Self-pay | Admitting: Interventional Cardiology

## 2022-04-30 DIAGNOSIS — R200 Anesthesia of skin: Secondary | ICD-10-CM

## 2022-04-30 NOTE — Telephone Encounter (Signed)
Ok to order carotid Duplex for numbness

## 2022-05-07 ENCOUNTER — Ambulatory Visit (HOSPITAL_COMMUNITY)
Admission: RE | Admit: 2022-05-07 | Discharge: 2022-05-07 | Disposition: A | Discharge status: Home / Self Care | Payer: Medicare Other | Source: Ambulatory Visit | Attending: Interventional Cardiology | Admitting: Interventional Cardiology

## 2022-05-07 DIAGNOSIS — R200 Anesthesia of skin: Secondary | ICD-10-CM | POA: Diagnosis present

## 2022-05-20 ENCOUNTER — Encounter: Payer: Self-pay | Admitting: Interventional Cardiology

## 2022-05-21 MED ORDER — NITROGLYCERIN 0.4 MG SL SUBL: 0.4000 mg | SUBLINGUAL_TABLET | SUBLINGUAL | 6 refills | Status: AC | PRN

## 2022-07-05 ENCOUNTER — Telehealth: Payer: Self-pay | Admitting: Interventional Cardiology

## 2022-07-05 NOTE — Telephone Encounter (Signed)
Patient is requesting lab orders be placed for him to have prior to his scheduled appointment.   He would like a callback to schedule once orders are placed.

## 2022-07-05 NOTE — Telephone Encounter (Signed)
Appointment is October 30.  Will forward to Dr Eldridge Dace to see what labs should be checked

## 2022-07-17 ENCOUNTER — Telehealth: Payer: Self-pay | Admitting: Interventional Cardiology

## 2022-07-17 NOTE — Telephone Encounter (Signed)
CBC, CMet, lipids, NMR in 10/24

## 2022-07-17 NOTE — Telephone Encounter (Signed)
  Patient is asking to schedule lab work before his follow up in October with Dr Eldridge Dace. Can orders be placed for any needed labs?

## 2022-07-17 NOTE — Telephone Encounter (Signed)
Returned Pts call about scheduling Labs before appointment in Oct. Pt would like to come in the Tuesday or Wednesday the week before appointment to have labs drawn. Told pt I would note the requested days and forward request to Dr.Vananasi to find out what labs would be needed and we would call back to confirm the day once we knew. Pt stated understanding.

## 2022-07-18 ENCOUNTER — Other Ambulatory Visit: Payer: Self-pay

## 2022-07-18 DIAGNOSIS — I25118 Atherosclerotic heart disease of native coronary artery with other forms of angina pectoris: Secondary | ICD-10-CM

## 2022-07-18 DIAGNOSIS — E782 Mixed hyperlipidemia: Secondary | ICD-10-CM

## 2022-07-18 NOTE — Telephone Encounter (Signed)
Called Pt and scheduled 12/12/22 labs. Pt instructed to come any time between 7:15 am and 4:00 pm. Pt stated understanding.

## 2022-07-18 NOTE — Telephone Encounter (Signed)
See 5/28 phone note 

## 2022-08-19 ENCOUNTER — Other Ambulatory Visit: Payer: Self-pay | Admitting: Interventional Cardiology

## 2022-09-24 ENCOUNTER — Ambulatory Visit: Payer: Medicare Other | Admitting: Podiatry

## 2022-09-24 DIAGNOSIS — L989 Disorder of the skin and subcutaneous tissue, unspecified: Secondary | ICD-10-CM

## 2022-09-24 NOTE — Progress Notes (Signed)
Chief Complaint  Patient presents with   Nail Problem    Corn/ hang nail  Dry skin     HPI: 72 y.o. male presenting today as a new patient for evaluation of the symptomatic skin lesion to the right fifth digit onset a few months now.  He tries to trim the skin himself but would like to have it evaluated  Patient also mentions 'bunion' to the right great toe joint.  He says it is mostly asymptomatic and is not concerned about it at the moment.  Onset several years ago.  Past Medical History:  Diagnosis Date   Allergy    seasonal- dust mites , tree pollen - worse in Spring    Anxiety    Arthritis    Coronary artery disease 07/13/2005   CABG   DDD (degenerative disc disease)    DJD (degenerative joint disease)    Elevated liver enzymes    GERD (gastroesophageal reflux disease)    Hemangioma of liver    Hemorrhoids    Hyperlipemia    Hypertension    PAC (premature atrial contraction)    Paroxysmal atrial fibrillation (HCC)    1 run of A Fib only- after heart surgery    Pneumonia    PVC (premature ventricular contraction)     Past Surgical History:  Procedure Laterality Date   COLONOSCOPY  05/2020   CORONARY ARTERY BYPASS GRAFT  07/13/2005   INGUINAL HERNIA REPAIR Right 1978, 2000   x 2   KNEE SURGERY Right    SEPTOPLASTY      Allergies  Allergen Reactions   Cephalexin Other (See Comments)    REACTION: tachycardia Rapid heart beat   Levofloxacin Other (See Comments)    REACTION: tachycardia Rapid heartbeat   Zoster Vac Recomb Adjuvanted Other (See Comments)    Shakes, drove him crazy    Clarithromycin Other (See Comments)    Mouth got white spots.   Penicillins Rash and Other (See Comments)    REACTION: rash Unsure   Ciprofloxacin Diarrhea   Doxycycline Other (See Comments)   Lipitor [Atorvastatin] Palpitations    Muscle aches on lipitor 80 mg daily     Physical Exam: General: The patient is alert and oriented x3 in no acute distress.  Dermatology:  Skin is warm, dry and supple bilateral lower extremities.  Hyperkeratotic corn/callus noted to the lateral nail fold of the right fifth digit consistent with a listers corn  Vascular: Palpable pedal pulses bilaterally. Capillary refill within normal limits.  No appreciable edema.  No erythema.  Neurological: Grossly intact via light touch  Musculoskeletal Exam: Hallux limitus noted with arthritic degenerative changes to the first MTP with limited range of motion.  There is no pain on palpation or range of motion  Assessment/Plan of Care: 1.  Hallux limitus right great toe joint; asymptomatic 2.  Symptomatic corn/callus lateral nail fold right fifth digit  -Excisional debridement of the hyperkeratotic corn/callus as well as debridement of the nail was performed to the right fifth toe.  Patient felt significant relief -Recommend wide fitting shoes that do not irritate the or constrict the toebox -Currently his arthritic condition to the right great toe joint is asymptomatic.  Continue conservative care -Return to clinic as needed       Felecia Shelling, DPM [facility]  Dr. Felecia Shelling, DPM    [address]  [place], [place] [address]                Office [phone]  Fax [phone]

## 2022-10-08 ENCOUNTER — Telehealth: Payer: Self-pay | Admitting: Interventional Cardiology

## 2022-10-08 DIAGNOSIS — I2510 Atherosclerotic heart disease of native coronary artery without angina pectoris: Secondary | ICD-10-CM

## 2022-10-08 NOTE — Telephone Encounter (Signed)
Pt sent this via Mychart to the sching pool:    Hello Noah [name]..thanks for your rapid reply.      im not sure it's a bp issue but more like what happened in these two episodes was it Tia , etc.    my last 5 bp readings are:  106/62 hr65, 119/65 69 , 128/79 66, 129/71 72 and 127/72 72.    The symptoms as  you state now are nil.  However in those two episodes I felt dizzy and shaky and had a slight headache.    My issue with the bp is that it went off the charts in these two episodes for whatever reason ( I was not doing anything at the times and first one I was in bed) and Bp probably went up further because I felt dizzy first  and my hr hit 103 in the first episode back in July.   So for the last two months or so readings are higher than my normal readings which are usually in the 115 / 70 range and sometimes lower .  Still the newer readings aren't high in my opinion albeit docs like 120/80 period but they do seem to be going up a bit .  My issue is with these two episodes and my feeling dizzy actually and really the arm and leg weakness / feeling shaky I  experienced yesterday afternoon.  That got me thinking something might be amiss.  I'm not feeling the arm and leg weakness now. It cleared up yesterday after I ate something .  That could have been it dont know exactly.      Good Afternoon Noah [name]  Can you tell me a little more about your Blood Pressure?      1. What are your last 5 BP readings?   2. Are you having any other symptoms (ex. Dizziness, headache, blurred vision, passed out)?   3. What is your BP issue?       Appointment Request From: Noah [name]  With Provider: Lance [name] [facility] [address]]  Preferred Date Range: 10/09/2022 - 10/19/2022  Preferred Times: Any Time  Reason for visit: Office Visit  Comments: Hi. I've had two episode in successive months July and august where bp was (170/90) elevated and I was lightheaded /dizzy.   Yesterday after driving home from beach I experienced weakness in my arms and legs which is not normal for me.  So I'd like to get these concerns checked out sooner than my yearly visit with Dr Noah [name] in October. Thank you.  Noah [name]

## 2022-10-10 NOTE — Telephone Encounter (Signed)
Followed up with pt. Pt states he is doing fine currently and will await to speak with Dennie Bible when she returns tomorrow. Aware forwarding to her  Pt appreciates the call.

## 2022-10-11 NOTE — Telephone Encounter (Signed)
Looking at the readings he sent, many are well controlled.  Is he eating more salt or taking in more alcohol at the beach? That could increase BP.  For now would continue to monitor and use an extra metoprolol as needed as he has done. We could increase the dose on a regular basis but the risk is dropping BP too low at times.  I would be curious to see if BP normalizes when he is back to his routine at home.

## 2022-10-12 ENCOUNTER — Ambulatory Visit (HOSPITAL_COMMUNITY): Payer: Medicare Other | Attending: Interventional Cardiology

## 2022-10-12 DIAGNOSIS — I2510 Atherosclerotic heart disease of native coronary artery without angina pectoris: Secondary | ICD-10-CM | POA: Insufficient documentation

## 2022-10-12 LAB — ECHOCARDIOGRAM COMPLETE
Area-P 1/2: 4.24 cm2
S' Lateral: 2.6 cm

## 2022-10-24 NOTE — Telephone Encounter (Signed)
I spoke with patient and scheduled him to see Dr Clifton James on December 11, 2022 at 10:40.  Lab work moved to October 15

## 2022-11-26 ENCOUNTER — Encounter: Payer: Self-pay | Admitting: Cardiovascular Disease

## 2022-11-26 DIAGNOSIS — R351 Nocturia: Secondary | ICD-10-CM

## 2022-11-30 ENCOUNTER — Ambulatory Visit: Payer: Medicare Other | Attending: Interventional Cardiology

## 2022-11-30 DIAGNOSIS — I25118 Atherosclerotic heart disease of native coronary artery with other forms of angina pectoris: Secondary | ICD-10-CM

## 2022-11-30 DIAGNOSIS — E782 Mixed hyperlipidemia: Secondary | ICD-10-CM

## 2022-11-30 DIAGNOSIS — R351 Nocturia: Secondary | ICD-10-CM

## 2022-12-02 LAB — CBC
Hematocrit: 49.2 % (ref 37.5–51.0)
Hemoglobin: 16 g/dL (ref 13.0–17.7)
MCH: 30.4 pg (ref 26.6–33.0)
MCHC: 32.5 g/dL (ref 31.5–35.7)
MCV: 93 fL (ref 79–97)
Platelets: 213 10*3/uL (ref 150–450)
RBC: 5.27 x10E6/uL (ref 4.14–5.80)
RDW: 13.1 % (ref 11.6–15.4)
WBC: 5.8 10*3/uL (ref 3.4–10.8)

## 2022-12-02 LAB — COMPREHENSIVE METABOLIC PANEL
ALT: 28 [IU]/L (ref 0–44)
AST: 30 [IU]/L (ref 0–40)
Albumin: 4.4 g/dL (ref 3.8–4.8)
Alkaline Phosphatase: 89 [IU]/L (ref 44–121)
BUN/Creatinine Ratio: 16 (ref 10–24)
BUN: 18 mg/dL (ref 8–27)
Bilirubin Total: 0.8 mg/dL (ref 0.0–1.2)
CO2: 25 mmol/L (ref 20–29)
Calcium: 9.7 mg/dL (ref 8.6–10.2)
Chloride: 101 mmol/L (ref 96–106)
Creatinine, Ser: 1.12 mg/dL (ref 0.76–1.27)
Globulin, Total: 2.7 g/dL (ref 1.5–4.5)
Glucose: 80 mg/dL (ref 70–99)
Potassium: 4.6 mmol/L (ref 3.5–5.2)
Sodium: 140 mmol/L (ref 134–144)
Total Protein: 7.1 g/dL (ref 6.0–8.5)
eGFR: 69 mL/min/{1.73_m2} (ref >59)

## 2022-12-02 LAB — NMR, LIPOPROFILE
Cholesterol, Total: 124 mg/dL (ref 100–199)
HDL Particle Number: 36.9 umol/L (ref >=30.5)
HDL-C: 55 mg/dL (ref >39)
LDL Particle Number: 767 nmol/L (ref <1000)
LDL Size: 20 nmol — ABNORMAL LOW (ref >20.5)
LDL-C (NIH Calc): 52 mg/dL (ref 0–99)
LP-IR Score: <25 (ref <=45)
Small LDL Particle Number: 490 nmol/L (ref <=527)
Triglycerides: 86 mg/dL (ref 0–149)

## 2022-12-02 LAB — LIPID PANEL
Chol/HDL Ratio: 2.6 {ratio} (ref 0.0–5.0)
Cholesterol, Total: 120 mg/dL (ref 100–199)
HDL: 47 mg/dL (ref >39)
LDL Chol Calc (NIH): 56 mg/dL (ref 0–99)
Triglycerides: 84 mg/dL (ref 0–149)
VLDL Cholesterol Cal: 17 mg/dL (ref 5–40)

## 2022-12-02 LAB — PSA: Prostate Specific Ag, Serum: 0.5 ng/mL (ref 0.0–4.0)

## 2022-12-04 ENCOUNTER — Other Ambulatory Visit: Payer: Medicare Other

## 2022-12-10 NOTE — Progress Notes (Unsigned)
No chief complaint on file.  History of Present Illness:71 yo male with history of anxiety, CAD s/p CABG, GERD, HTN, hyperlipidemia, PAF, PACs, PVCs here today for follow up. He has been followed by Dr. Eldridge Dace. He had 3V CABG in 2007 (LIMA to LAD, free RIMA to OM, SVG to PDA). Nuclear stress test in March 2022 with no ischemia. Cardiac monitor in December 2023 with sinus, brief runs of SVT, no atrial fibrillation. Carotid artery dopplers in March 2024 with no significant disease. Echo August 2024 with LVEF=60-65%. No significant valve disease.   She is here today for follow up. The patient denies any chest pain, dyspnea, palpitations, lower extremity edema, orthopnea, PND, dizziness, near syncope or syncope.   Primary Care Physician: Daisy Floro, MD   Past Medical History:  Diagnosis Date   Allergy    seasonal- dust mites , tree pollen - worse in Spring    Anxiety    Arthritis    Coronary artery disease 07/13/2005   CABG   DDD (degenerative disc disease)    DJD (degenerative joint disease)    Elevated liver enzymes    GERD (gastroesophageal reflux disease)    Hemangioma of liver    Hemorrhoids    Hyperlipemia    Hypertension    PAC (premature atrial contraction)    Paroxysmal atrial fibrillation (HCC)    1 run of A Fib only- after heart surgery    Pneumonia    PVC (premature ventricular contraction)     Past Surgical History:  Procedure Laterality Date   COLONOSCOPY  05/2020   CORONARY ARTERY BYPASS GRAFT  07/13/2005   INGUINAL HERNIA REPAIR Right 1978, 2000   x 2   KNEE SURGERY Right    SEPTOPLASTY      Current Outpatient Medications  Medication Sig Dispense Refill   amoxicillin (AMOXIL) 500 MG capsule Take 500 mg by mouth every 8 (eight) hours.     aspirin EC 81 MG tablet Take 1 tablet (81 mg total) by mouth daily.     calcium carbonate (TUMS - DOSED IN MG ELEMENTAL CALCIUM) 500 MG chewable tablet Chew 1 tablet by mouth daily as needed.      Cholecalciferol (VITAMIN D) 2000 UNITS tablet Take 2,000 Units by mouth daily.     famotidine (PEPCID) 20 MG tablet Take 1 tablet (20 mg total) by mouth at bedtime. 90 tablet 3   Flaxseed, Linseed, POWD Take 2 scoop by mouth as directed.     Ganciclovir (ZIRGAN) 0.15 % GEL 1 application. As needed for cold sores     ibuprofen (ADVIL,MOTRIN) 200 MG tablet Take 200 mg by mouth every 6 (six) hours as needed.     LORazepam (ATIVAN) 0.5 MG tablet Take 1 tablet by mouth at bedtime as needed for sleep. To help with sleep     meclizine (ANTIVERT) 25 MG tablet Take 25 mg by mouth 3 (three) times daily as needed for dizziness.     metoprolol succinate (TOPROL-XL) 25 MG 24 hr tablet TAKE 1 TABLET BY MOUTH TWICE  DAILY 180 tablet 2   metoprolol tartrate (LOPRESSOR) 25 MG tablet TAKE 1/2 -1 TABLET BY MOUTH AS NEEDED 90 tablet 2   Multiple Vitamin (MULTIVITAMIN WITH MINERALS) TABS Take 1 tablet by mouth daily. Centrum silver     nitroGLYCERIN (NITROSTAT) 0.4 MG SL tablet Place 1 tablet (0.4 mg total) under the tongue every 5 (five) minutes as needed for chest pain (3 DOSES MAX). 25 tablet 6  Pediatric Multiple Vitamins (FLINTSTONES PLUS EXTRA C) CHEW Chew 1 tablet by mouth daily.     pramoxine-hydrocortisone (ANALPRAM HC) cream Apply 1 application topically at bedtime as needed (for skin).      pramoxine-hydrocortisone (PROCTOCREAM-HC) 1-1 % rectal cream Apply topically.     Psyllium (METAMUCIL PO) 1 tablespoons by mouth as needed, for consitpation     rosuvastatin (CRESTOR) 20 MG tablet TAKE 1 TABLET BY MOUTH DAILY 90 tablet 2   tobramycin-dexamethasone (TOBRADEX) ophthalmic solution SMARTSIG:1-2 Drop(s) In Eye(s) Twice Daily PRN     Ubiquinol 100 MG CAPS Take 100 mg by mouth daily.     No current facility-administered medications for this visit.   Facility-Administered Medications Ordered in Other Visits  Medication Dose Route Frequency Provider Last Rate Last Admin   regadenoson (LEXISCAN) injection  SOLN 0.4 mg  0.4 mg Intravenous Once Nahser, Deloris Ping, MD        Allergies  Allergen Reactions   Cephalexin Other (See Comments)    REACTION: tachycardia Rapid heart beat   Levofloxacin Other (See Comments)    REACTION: tachycardia Rapid heartbeat   Zoster Vac Recomb Adjuvanted Other (See Comments)    Shakes, drove him crazy    Clarithromycin Other (See Comments)    Mouth got white spots.   Penicillins Rash and Other (See Comments)    REACTION: rash Unsure   Ciprofloxacin Diarrhea   Doxycycline Other (See Comments)   Lipitor [Atorvastatin] Palpitations    Muscle aches on lipitor 80 mg daily    Social History   Socioeconomic History   Marital status: Married    Spouse name: Not on file   Number of children: 1   Years of education: Not on file   Highest education level: Not on file  Occupational History   Occupation: retired    Associate Professor: RETIRED  Tobacco Use   Smoking status: Former    Current packs/day: 0.00    Types: Cigarettes    Quit date: 02/19/1974    Years since quitting: 48.8   Smokeless tobacco: Never   Tobacco comments:    stopped 32 yrs ago  Substance and Sexual Activity   Alcohol use: Yes    Comment: rare   Drug use: No   Sexual activity: Not on file  Other Topics Concern   Not on file  Social History Narrative   Pt lives in [place] with spouse.  Son is grown.  Retired from [employer].     patient rides bicycle 7 to 8  Mile a day in a 1/2hr.   Social Determinants of Health   Financial Resource Strain: Not on file  Food Insecurity: Not on file  Transportation Needs: Not on file  Physical Activity: Not on file  Stress: Not on file  Social Connections: Unknown (07/04/2021)   Received [facility], [facility]   Social Network    Social Network: Not on file  Intimate Partner Violence: Unknown (05/26/2021)   Received from [facility], [facility]   HITS    Physically Hurt: Not on file    Insult or Talk Down To: Not on file    Threaten  Physical Harm: Not on file    Scream or Curse: Not on file    Family History  Problem Relation Age of Onset   Coronary artery disease Father    Heart attack Father    Atrial fibrillation Mother 22   Colon polyps Mother    Seizures Brother    Prostate cancer Maternal Uncle  Heart attack Maternal Grandfather    Heart attack Paternal Grandmother    Heart attack Paternal Grandfather    Colon cancer Neg Hx    Esophageal cancer Neg Hx    Rectal cancer Neg Hx    Stomach cancer Neg Hx     Review of Systems:  As stated in the HPI and otherwise negative.   There were no vitals taken for this visit.  Physical Examination: General: Well developed, well nourished, NAD  HEENT: OP clear, mucus membranes moist  SKIN: warm, dry. No rashes. Neuro: No focal deficits  Musculoskeletal: Muscle strength 5/5 all ext  Psychiatric: Mood and affect normal  Neck: No JVD, no carotid bruits, no thyromegaly, no lymphadenopathy.  Lungs:Clear bilaterally, no wheezes, rhonci, crackles Cardiovascular: Regular rate and rhythm. No murmurs, gallops or rubs. Abdomen:Soft. Bowel sounds present. Non-tender.  Extremities: No lower extremity edema. Pulses are 2 + in the bilateral DP/PT.  EKG:  EKG {ACTION; IS/IS ZOX:[id number]} ordered today. The ekg ordered today demonstrates ***  Recent Labs: 11/30/2022: ALT 28; BUN 18; Creatinine, Ser 1.12; Hemoglobin 16.0; Platelets 213; Potassium 4.6; Sodium 140   Lipid Panel    Component Value Date/Time   CHOL 120 11/30/2022 0956   CHOL 121 02/09/2016 1206   CHOL 124 07/28/2014 0903   TRIG 84 11/30/2022 0956   TRIG 108 01/23/2017 1030   TRIG 112 07/28/2014 0903   HDL 47 11/30/2022 0956   HDL 46 01/23/2017 1030   HDL 46 07/28/2014 0903   CHOLHDL 2.6 11/30/2022 0956   CHOLHDL 2.5 02/09/2016 1206   LDLCALC 56 11/30/2022 0956   LDLCALC 53 02/09/2016 1206   LDLCALC 56 07/28/2014 0903     Wt Readings from Last 3 Encounters:  04/19/22 76.7 kg  01/17/22 76.7 kg   10/17/21 78.5 kg    Assessment and Plan:   1. CAD s/p CABG without angina: No chest pain suggestive of angina. Continue ASA, beta blocker and statin.   2. Hyperlipidemia: LDL at goal in October 2024. Continue statin  3. HTN: BP is well controlled. Continue current therapy.   4. PVCs/PACs: No palpitations. Continue beta blocker.   Labs/ tests ordered today include:  No orders of the defined types were placed in this encounter.  Disposition:   F/U with me in one year    Signed, Verne Carrow, MD, [facility] 12/10/2022 2:16 PM    [facility] [address] [place], [place], [place]  [address] Phone: [phone]; Fax: [phone]

## 2022-12-11 ENCOUNTER — Ambulatory Visit: Payer: Medicare Other | Attending: Interventional Cardiology | Admitting: Cardiovascular Disease

## 2022-12-11 ENCOUNTER — Encounter: Payer: Self-pay | Admitting: Cardiovascular Disease

## 2022-12-11 VITALS — BP 114/66 | HR 76 | Ht 67.5 in | Wt 173.6 lb

## 2022-12-11 DIAGNOSIS — I2510 Atherosclerotic heart disease of native coronary artery without angina pectoris: Secondary | ICD-10-CM | POA: Diagnosis not present

## 2022-12-11 DIAGNOSIS — I491 Atrial premature depolarization: Secondary | ICD-10-CM | POA: Diagnosis not present

## 2022-12-11 DIAGNOSIS — I10 Essential (primary) hypertension: Secondary | ICD-10-CM

## 2022-12-11 DIAGNOSIS — E782 Mixed hyperlipidemia: Secondary | ICD-10-CM

## 2022-12-11 NOTE — Patient Instructions (Signed)
Medication Instructions:  No changes *If you need a refill on your cardiac medications before your next appointment, please call your pharmacy*   Lab Work: none   Testing/Procedures: none   Follow-Up: At [facility], you and your health needs are our priority.  As part of our continuing mission to provide you with exceptional heart care, we have created designated Provider Care Teams.  These Care Teams include your primary Cardiologist (physician) and Advanced Practice Providers (APPs -  Physician Assistants and Nurse Practitioners) who all work together to provide you with the care you need, when you need it.   Your next appointment:   12 month(s)  Provider:   Christopher McAlhany, MD   

## 2022-12-12 ENCOUNTER — Other Ambulatory Visit: Payer: Medicare Other

## 2022-12-19 ENCOUNTER — Ambulatory Visit: Payer: Medicare Other | Admitting: Interventional Cardiology

## 2022-12-20 ENCOUNTER — Ambulatory Visit: Payer: Medicare Other | Admitting: Internal Medicine

## 2022-12-20 ENCOUNTER — Encounter: Payer: Self-pay | Admitting: Internal Medicine

## 2022-12-20 VITALS — BP 110/60 | HR 75 | Ht 67.0 in | Wt 171.0 lb

## 2022-12-20 DIAGNOSIS — R197 Diarrhea, unspecified: Secondary | ICD-10-CM | POA: Diagnosis not present

## 2022-12-20 DIAGNOSIS — K219 Gastro-esophageal reflux disease without esophagitis: Secondary | ICD-10-CM | POA: Diagnosis not present

## 2022-12-20 MED ORDER — PANTOPRAZOLE SODIUM 40 MG PO TBEC: 40.0000 mg | DELAYED_RELEASE_TABLET | Freq: Every day | ORAL | 3 refills | Status: DC

## 2022-12-20 NOTE — Progress Notes (Signed)
HISTORY OF PRESENT ILLNESS:  Noah [name] is a 72 y.o. male, native of [place] and previous patient of Dr. Christella Hartigan, with multiple medical problems as listed below.  He presents today regarding significant reflux symptoms and intermittent loose stools.  Patient was evaluated previously for loose stools and underwent colonoscopy May 23, 2020.  He was found to have left-sided diverticulosis and hemorrhoids.  Biopsies of the colon and ileum were normal.  Patient tells me that he thinks his diarrhea may be related to cashews.  He does state that if he takes Imodium he will not have diarrhea for some time.  Blood work from November 30, 2022 shows a normal comprehensive metabolic panel including albumin and normal CBC with hemoglobin 16.0.  Next, he reports chronic GERD.  He did undergo upper endoscopy January 2023.  This was essentially normal.  Biopsies for H. pylori were negative.  For his GERD he takes famotidine 20 mg daily.  With his regimen he has significant reflux with regurgitation and pyrosis.  No dysphagia.  GI review of systems is otherwise negative  REVIEW OF SYSTEMS:  All non-GI ROS negative except for arthritis  Past Medical History:  Diagnosis Date   Allergy    seasonal- dust mites , tree pollen - worse in Spring    Anxiety    Arthritis    Coronary artery disease 07/13/2005   CABG   DDD (degenerative disc disease)    DJD (degenerative joint disease)    Elevated liver enzymes    GERD (gastroesophageal reflux disease)    Hemangioma of liver    Hemorrhoids    Hyperlipemia    Hypertension    PAC (premature atrial contraction)    Paroxysmal atrial fibrillation (HCC)    1 run of A Fib only- after heart surgery    Pneumonia    PVC (premature ventricular contraction)     Past Surgical History:  Procedure Laterality Date   COLONOSCOPY  05/2020   CORONARY ARTERY BYPASS GRAFT  07/13/2005   INGUINAL HERNIA REPAIR Right 1978, 2000   x 2   KNEE SURGERY Right     SEPTOPLASTY      Social History CHRSITOPHER CHASEY  reports that he quit smoking about 48 years ago. His smoking use included cigarettes. He has never used smokeless tobacco. He reports current alcohol use. He reports that he does not use drugs.  family history includes Atrial fibrillation (age of onset: 35) in his mother; Colon polyps in his mother; Coronary artery disease in his father; Heart attack in his father, maternal grandfather, paternal grandfather, and paternal grandmother; Prostate cancer in his maternal uncle; Seizures in his brother.  Allergies  Allergen Reactions   Cephalexin Other (See Comments)    REACTION: tachycardia Rapid heart beat   Levofloxacin Other (See Comments)    REACTION: tachycardia Rapid heartbeat   Zoster Vac Recomb Adjuvanted Other (See Comments)    Shakes, drove him crazy    Clarithromycin Other (See Comments)    Mouth got white spots.   Penicillins Rash and Other (See Comments)    REACTION: rash Unsure   Ciprofloxacin Diarrhea   Doxycycline Other (See Comments)   Lipitor [Atorvastatin] Palpitations    Muscle aches on lipitor 80 mg daily       PHYSICAL EXAMINATION: Vital signs: BP 110/60   Pulse 75   Ht 5\' 7"  (1.702 m)   Wt 171 lb (77.6 kg)   BMI 26.78 kg/m   Constitutional: generally well-appearing, no acute distress  Psychiatric: alert and oriented x3, cooperative Eyes: extraocular movements intact, anicteric, conjunctiva pink Mouth: oral pharynx moist, no lesions Neck: supple no lymphadenopathy Cardiovascular: heart regular rate and rhythm, no murmur Lungs: clear to auscultation bilaterally Abdomen: soft, nontender, nondistended, no obvious ascites, no peritoneal signs, normal bowel sounds, no organomegaly Rectal: Omitted Extremities: no clubbing, cyanosis, or lower extremity edema bilaterally Skin: no lesions on visible extremities Neuro: No focal deficits.  Cranial nerve intact  ASSESSMENT:  1.  GERD.  Significant symptoms on  famotidine once daily. 2.  Essentially normal EGD January 2023 3.  Functional loose stools.  Negative workup.  Colonoscopy April 2022 as described   PLAN:  1.  Reflux precautions 2.  Prescribe pantoprazole 40 mg daily.  Medication risk reviewed 3.  Imodium as needed loose stools 4.  GI office follow-up to monitor his response to medical therapy for GERD in about 2 months. A total time of 35 minutes was spent preparing to see the patient, obtaining comprehensive history, performing medically appropriate physical examination, counseling and educating the patient regarding the above listed issues, ordering medication, arranging follow-up, and documenting clinical information in the health record

## 2022-12-20 NOTE — Patient Instructions (Signed)
We have sent the following medications to your pharmacy for you to pick up at your convenience:  Pantoprazole.  _______________________________________________________  If your blood pressure at your visit was 140/90 or greater, please contact your primary care physician to follow up on this.  _______________________________________________________  If you are age 74 or older, your body mass index should be between 23-30. Your Body mass index is 26.78 kg/m. If this is out of the aforementioned range listed, please consider follow up with your Primary Care Provider.  If you are age 79 or younger, your body mass index should be between 19-25. Your Body mass index is 26.78 kg/m. If this is out of the aformentioned range listed, please consider follow up with your Primary Care Provider.   ________________________________________________________  The [facility] providers would like to encourage you to use [facility] to communicate with providers for non-urgent requests or questions.  Due to long hold times on the telephone, sending your provider a message by [facility] may be a faster and more efficient way to get a response.  Please allow 48 business hours for a response.  Please remember that this is for non-urgent requests.  _______________________________________________________

## 2022-12-31 ENCOUNTER — Encounter: Payer: Self-pay | Admitting: Internal Medicine

## 2023-02-22 ENCOUNTER — Encounter: Payer: Self-pay | Admitting: Cardiovascular Disease

## 2023-02-22 ENCOUNTER — Ambulatory Visit: Payer: Medicare Other | Admitting: Internal Medicine

## 2023-04-16 ENCOUNTER — Telehealth: Payer: Self-pay | Admitting: Cardiovascular Disease

## 2023-04-16 DIAGNOSIS — I493 Ventricular premature depolarization: Secondary | ICD-10-CM

## 2023-04-16 DIAGNOSIS — R002 Palpitations: Secondary | ICD-10-CM

## 2023-04-16 NOTE — Telephone Encounter (Signed)
  Per MyChart scheduling message:   STAT if patient feels like he/she is going to faint   1. Are you feeling dizzy, lightheaded, or faint right now?     2. Have you passed out?   (If yes move to .SYNCOPECHMG)   3. Do you have any other symptoms?    4. Have you checked your HR and BP (record if available)?   1. No  2. No 3. Yes, Recently during my recumbent bike workouts .  I do two workouts am and pm for 1/2 hour each daily for last 20 years.  Plus neck and back exercises only done in am.  At the end of my two nightly  bike workouts.on this past Saturday and Sunday nights,  about 2 minutes to go  when hr rate had reached the 130s ( normal at end), my heart rate jumped up to 156 then to 149 then to 87 and back to normal at around 125.  This lasted no more then 5 secs I would estimate. I wasn't dizzy but I did feel  my hr palpitation that caused it?  This has happened before where it jumped up but never  felt any palpitation but it went up and back down very quickly . Also last night my head seemed stuffy and full at the end and this am heard my heartbeat /pounding in left ear .     4.  My BP and hr are taken upon rising and then in the am and pm after my workouts and cool down period.  I use an oximeter as well  to measure oxygen and hr as well and that averages to 96 and hr back into 70s. That's what it was on Sunday and Saturday night.  Saturday   Hr was at 80 afterwards because I was a bit uptight on what had happened again.    Saturday -  am BP and hr after 11 am workout was 122/69 hr was 65.  After 8 pm workout  , was 119/66  hr 70.    Sunday - am bp and hr after 10 am workout was 122/ 80 and hr was 71. After 9 pm workout  was  118/69 hrs was 75.    Monday - today  took bp at 9 am was kinda high for am for me at 134/74 hr 71 since I started hearing that pounding or heart beat in my left ear .  Took it again a bit later and it was 119/68 and hr was 70.

## 2023-04-17 ENCOUNTER — Ambulatory Visit: Payer: Medicare Other | Attending: Cardiovascular Disease

## 2023-04-17 DIAGNOSIS — R002 Palpitations: Secondary | ICD-10-CM

## 2023-04-17 DIAGNOSIS — I493 Ventricular premature depolarization: Secondary | ICD-10-CM

## 2023-04-17 NOTE — Addendum Note (Signed)
 Addended by: Baird Lyons on: 04/17/2023 10:27 AM   Modules accepted: Orders

## 2023-04-17 NOTE — Telephone Encounter (Signed)
 Left message to call back

## 2023-04-17 NOTE — Telephone Encounter (Signed)
 Advised of MD's response/recommendation. Pt agreeable to a monitor and aware it will be mailed.  Informed pt that I would send him information on Zio monitor via mychart. Aware it will be about 2 weeks after mailing monitor back before he hears results. Patient verbalized understanding and agreeable to plan.

## 2023-04-17 NOTE — Progress Notes (Unsigned)
 Enrolled for [employer] to mail a ZIO XT long term holter monitor to the patients address on file.

## 2023-04-18 NOTE — Telephone Encounter (Signed)
 Left detailed message for pt answering his questions.

## 2023-04-23 DIAGNOSIS — R002 Palpitations: Secondary | ICD-10-CM | POA: Diagnosis not present

## 2023-04-23 DIAGNOSIS — I493 Ventricular premature depolarization: Secondary | ICD-10-CM | POA: Diagnosis not present

## 2023-04-23 NOTE — Telephone Encounter (Signed)
 Called patient All questions answered.

## 2023-05-02 ENCOUNTER — Encounter: Payer: Self-pay | Admitting: Cardiovascular Disease

## 2023-05-06 ENCOUNTER — Encounter: Payer: Self-pay | Admitting: Cardiovascular Disease

## 2023-05-06 ENCOUNTER — Ambulatory Visit: Attending: Cardiovascular Disease | Admitting: Cardiovascular Disease

## 2023-05-06 VITALS — BP 150/80 | HR 72 | Ht 67.0 in | Wt 171.0 lb

## 2023-05-06 DIAGNOSIS — E782 Mixed hyperlipidemia: Secondary | ICD-10-CM | POA: Diagnosis not present

## 2023-05-06 DIAGNOSIS — I2510 Atherosclerotic heart disease of native coronary artery without angina pectoris: Secondary | ICD-10-CM | POA: Diagnosis not present

## 2023-05-06 DIAGNOSIS — I10 Essential (primary) hypertension: Secondary | ICD-10-CM

## 2023-05-06 DIAGNOSIS — I491 Atrial premature depolarization: Secondary | ICD-10-CM

## 2023-05-06 NOTE — Progress Notes (Signed)
 Chief Complaint  Patient presents with   Follow-up    CAD   History of Present Illness: 73 yo male with history of anxiety, CAD s/p CABG, GERD, RBBB, HTN, hyperlipidemia, PAF, PACs, PVCs here today for follow up. He has been followed by Dr. Eldridge Dace. He had 3V CABG in 2007 (LIMA to LAD, free RIMA to OM, SVG to PDA). Nuclear stress test in March 2022 with no ischemia. Cardiac monitor in December 2023 with sinus, PACs, PVCs, brief runs of SVT, no atrial fibrillation. Carotid artery dopplers in March 2024 with no significant disease. Echo August 2024 with LVEF=60-65%. No significant valve disease. I saw him in October 2024 and he was doing well. New RBBB on EKG in October 2024. He called into our office February 2025 and reported dizziness which occurred after eating dark chocolate. 3 day cardiac monitor March 2025 with sinus, several runs of SVT, longest 14 beats. His diary of symptoms correlated with PVCs and PACs but not with the SVT.    He is here today for follow up which was arranged today to discuss his questions that were presented by several MyChart messages. The patient denies any chest pain, dyspnea, palpitations, lower extremity edema, orthopnea, PND, near syncope or syncope. He has had episodes of feeling lightheaded after eating sweet foods.   Primary Care Physician: Daisy Floro, MD   Past Medical History:  Diagnosis Date   Allergy    seasonal- dust mites , tree pollen - worse in Spring    Anxiety    Arthritis    Coronary artery disease 07/13/2005   CABG   DDD (degenerative disc disease)    DJD (degenerative joint disease)    Elevated liver enzymes    GERD (gastroesophageal reflux disease)    Hemangioma of liver    Hemorrhoids    Hyperlipemia    Hypertension    PAC (premature atrial contraction)    Paroxysmal atrial fibrillation (HCC)    1 run of A Fib only- after heart surgery    Pneumonia    PVC (premature ventricular contraction)     Past Surgical History:   Procedure Laterality Date   COLONOSCOPY  05/2020   CORONARY ARTERY BYPASS GRAFT  07/13/2005   INGUINAL HERNIA REPAIR Right 1978, 2000   x 2   KNEE SURGERY Right    SEPTOPLASTY      Current Outpatient Medications  Medication Sig Dispense Refill   amoxicillin (AMOXIL) 500 MG capsule Take 500 mg by mouth every 8 (eight) hours.     aspirin EC 81 MG tablet Take 1 tablet (81 mg total) by mouth daily.     calcium carbonate (TUMS - DOSED IN MG ELEMENTAL CALCIUM) 500 MG chewable tablet Chew 1 tablet by mouth daily as needed.     Cholecalciferol (VITAMIN D) 2000 UNITS tablet Take 2,000 Units by mouth daily.     Flaxseed, Linseed, POWD Take 2 scoop by mouth as directed.     Ganciclovir (ZIRGAN) 0.15 % GEL 1 application. As needed for cold sores     hydrocortisone 2.5 % cream Apply 1 Application topically.     ibuprofen (ADVIL,MOTRIN) 200 MG tablet Take 200 mg by mouth every 6 (six) hours as needed.     LORazepam (ATIVAN) 0.5 MG tablet Take 1 tablet by mouth at bedtime as needed for sleep. To help with sleep     metoprolol succinate (TOPROL-XL) 25 MG 24 hr tablet TAKE 1 TABLET BY MOUTH TWICE  DAILY 180 tablet  2   metoprolol tartrate (LOPRESSOR) 25 MG tablet TAKE 1/2 -1 TABLET BY MOUTH AS NEEDED 90 tablet 2   Multiple Vitamin (MULTIVITAMIN WITH MINERALS) TABS Take 1 tablet by mouth daily. Centrum silver     nitroGLYCERIN (NITROSTAT) 0.4 MG SL tablet Place 1 tablet (0.4 mg total) under the tongue every 5 (five) minutes as needed for chest pain (3 DOSES MAX). 25 tablet 6   pantoprazole (PROTONIX) 40 MG tablet Take 1 tablet (40 mg total) by mouth daily. 90 tablet 3   pramoxine-hydrocortisone (ANALPRAM HC) cream Apply 1 application topically at bedtime as needed (for skin).      pramoxine-hydrocortisone (PROCTOCREAM-HC) 1-1 % rectal cream Apply topically.     rosuvastatin (CRESTOR) 20 MG tablet TAKE 1 TABLET BY MOUTH DAILY 90 tablet 2   tobramycin-dexamethasone (TOBRADEX) ophthalmic solution  SMARTSIG:1-2 Drop(s) In Eye(s) Twice Daily PRN     Ubiquinol 100 MG CAPS Take 100 mg by mouth daily.     No current facility-administered medications for this visit.   Facility-Administered Medications Ordered in Other Visits  Medication Dose Route Frequency Provider Last Rate Last Admin   regadenoson (LEXISCAN) injection SOLN 0.4 mg  0.4 mg Intravenous Once Nahser, Deloris Ping, MD        Allergies  Allergen Reactions   Cephalexin Other (See Comments)    REACTION: tachycardia Rapid heart beat   Levofloxacin Other (See Comments)    REACTION: tachycardia Rapid heartbeat   Zoster Vac Recomb Adjuvanted Other (See Comments)    Shakes, drove him crazy    Clarithromycin Other (See Comments)    Mouth got white spots.   Penicillins Rash and Other (See Comments)    REACTION: rash Unsure   Ciprofloxacin Diarrhea   Doxycycline Other (See Comments)   Lipitor [Atorvastatin] Palpitations    Muscle aches on lipitor 80 mg daily    Social History   Socioeconomic History   Marital status: Married    Spouse name: Not on file   Number of children: 1   Years of education: Not on file   Highest education level: Not on file  Occupational History   Occupation: retired    Associate Professor: RETIRED  Tobacco Use   Smoking status: Former    Current packs/day: 0.00    Types: Cigarettes    Quit date: 02/19/1974    Years since quitting: 49.2   Smokeless tobacco: Never   Tobacco comments:    stopped 32 yrs ago  Substance and Sexual Activity   Alcohol use: Yes    Comment: rare   Drug use: No   Sexual activity: Not on file  Other Topics Concern   Not on file  Social History Narrative   Pt lives in [place] with spouse.  Son is grown.  Retired from [employer].     patient rides bicycle 7 to 8  Mile a day in a 1/2hr.   Social Drivers of Corporate investment banker Strain: Not on file  Food Insecurity: Not on file  Transportation Needs: Not on file  Physical Activity: Not on file  Stress: Not on file   Social Connections: Unknown (07/04/2021)   Received from [facility], [facility]   Social Network    Social Network: Not on file  Intimate Partner Violence: Unknown (05/26/2021)   Received from [facility], [facility]   HITS    Physically Hurt: Not on file    Insult or Talk Down To: Not on file    Threaten Physical Harm: Not  on file    Scream or Curse: Not on file    Family History  Problem Relation Age of Onset   Coronary artery disease Father    Heart attack Father    Atrial fibrillation Mother 58   Colon polyps Mother    Seizures Brother    Prostate cancer Maternal Uncle    Heart attack Maternal Grandfather    Heart attack Paternal Grandmother    Heart attack Paternal Grandfather    Colon cancer Neg Hx    Esophageal cancer Neg Hx    Rectal cancer Neg Hx    Stomach cancer Neg Hx     Review of Systems:  As stated in the HPI and otherwise negative.   BP (!) 150/80   Pulse 72   Ht 5\' 7"  (1.702 m)   Wt 77.6 kg   SpO2 99%   BMI 26.78 kg/m   Physical Examination:  General: Well developed, well nourished, NAD  HEENT: OP clear, mucus membranes moist  SKIN: warm, dry. No rashes. Neuro: No focal deficits  Musculoskeletal: Muscle strength 5/5 all ext  Psychiatric: Mood and affect normal  Neck: No JVD, no carotid bruits, no thyromegaly, no lymphadenopathy.  Lungs:Clear bilaterally, no wheezes, rhonci, crackles Cardiovascular: Regular rate and rhythm. No murmurs, gallops or rubs. Abdomen:Soft. Bowel sounds present. Non-tender.  Extremities: No lower extremity edema. Pulses are 2 + in the bilateral DP/PT.  EKG:  EKG is not ordered today. The ekg ordered today demonstrates   Recent Labs: 11/30/2022: ALT 28; BUN 18; Creatinine, Ser 1.12; Hemoglobin 16.0; Platelets 213; Potassium 4.6; Sodium 140   Lipid Panel    Component Value Date/Time   CHOL 120 11/30/2022 0956   CHOL 121 02/09/2016 1206   CHOL 124 07/28/2014 0903   TRIG 84 11/30/2022 0956   TRIG 108  01/23/2017 1030   TRIG 112 07/28/2014 0903   HDL 47 11/30/2022 0956   HDL 46 01/23/2017 1030   HDL 46 07/28/2014 0903   CHOLHDL 2.6 11/30/2022 0956   CHOLHDL 2.5 02/09/2016 1206   LDLCALC 56 11/30/2022 0956   LDLCALC 53 02/09/2016 1206   LDLCALC 56 07/28/2014 0903     Wt Readings from Last 3 Encounters:  05/06/23 77.6 kg  12/20/22 77.6 kg  12/11/22 78.7 kg    Assessment and Plan:   1. CAD s/p CABG without angina: No chest pain. Will continue ASA, statin and beta blocker.  I have reassured him that his EKG showed poor R wave progression dating back to 2016.   2. Hyperlipidemia: LDL 52 in October 2024. NMR profile reviewed. Continue statin.   3. HTN: BP is well controlled at home. Continue current therapy   4. PVCs/PACs/SVT: No palpitations. Continue beta blocker.   Labs/ tests ordered today include:  No orders of the defined types were placed in this encounter.  Disposition:   F/U with me in one year   Signed, Verne Carrow, MD, [facility] 05/06/2023 3:55 PM    [facility] [address], [place], [place]  [address] Phone: [phone]; Fax: [phone]

## 2023-05-06 NOTE — Patient Instructions (Signed)
Medication Instructions:  No changes *If you need a refill on your cardiac medications before your next appointment, please call your pharmacy*   Lab Work: none If you have labs (blood work) drawn today and your tests are completely normal, you will receive your results only by: . MyChart Message (if you have MyChart) OR . A paper copy in the mail If you have any lab test that is abnormal or we need to change your treatment, we will call you to review the results.   Testing/Procedures: none   Follow-Up: As planned  Other Instructions   

## 2023-05-21 ENCOUNTER — Telehealth: Payer: Self-pay | Admitting: Cardiovascular Disease

## 2023-05-21 DIAGNOSIS — I2510 Atherosclerotic heart disease of native coronary artery without angina pectoris: Secondary | ICD-10-CM

## 2023-05-21 DIAGNOSIS — Z951 Presence of aortocoronary bypass graft: Secondary | ICD-10-CM

## 2023-05-21 DIAGNOSIS — E782 Mixed hyperlipidemia: Secondary | ICD-10-CM

## 2023-05-21 NOTE — Telephone Encounter (Signed)
  Per MyChart scheduling message:  Patient is asking if he will need to have lab work done and when he should have it done. Please advise.

## 2023-05-21 NOTE — Telephone Encounter (Signed)
 [place], [place] - 05/21/2023  9:01 AM Kathleene Hazel, MD  Sent: Tue May 21, 2023  1:19 PM  To: Loa Socks, LPN  Cc: Lendon Ka, RN         Message  It is April the 1st. We can place the orders for him if that will make him happy and then draw the labs 6 months from now. Chris    Pt aware of the above recommendations from Dr. Clifton James.   Pt states he doesn't want the labs now and again in September, he only wants them done in Sept.  Pt aware I will place lab orders for CMET, Lipids, and NMR Lipoprofile in the system for him to have done in Sept 2025.  Pt aware to go fasting to this lab appt.    Lab orders placed and released in the system for then.  Pt verbalized understanding and agrees with this plan.

## 2023-05-21 NOTE — Telephone Encounter (Signed)
  Kathleene Hazel, MD  Lendon Ka, RN Cc: Vida Rigger [address] [facility] Triage Caller: Unspecified (Today,  9:01 AM) I just saw him and did not order any lab work. Not sure what he is asking about. Thayer Ohm     Returned a call back to the pt to endorse Dr. Gibson Ramp recommendations, from the [place] message he sent earlier.  Informed the pt that per Dr. Clifton James, when he saw him on 3/17, he did not order any lab work at that time.    Pt was very anxious with this response and requested that I get another message to Dr. Clifton James, asking if he would be ok with ordering for the pt to get repeat lipids, NMR lipoprofile, and a CMET to be done sometime in Sept.  Pt states Dr. Eldridge Dace always okayed for him to get this panel done twice a year to reassess his lipids.   Pt states he finds comfort and reassurance having these labs checked twice a year, given his history of CABG in 2007.   Pt is aware that I will route this message back to Dr. Clifton James to further advise on requested lab orders (cmet, lipids, NMR lipoprofile) for this upcoming Sept, and nursing will follow-up with him accordingly thereafter.   Pt verbalized understanding and agrees with this plan.

## 2023-05-30 ENCOUNTER — Other Ambulatory Visit: Payer: Self-pay | Admitting: Family Medicine

## 2023-05-30 DIAGNOSIS — R899 Unspecified abnormal finding in specimens from other organs, systems and tissues: Secondary | ICD-10-CM

## 2023-06-07 ENCOUNTER — Ambulatory Visit
Admission: RE | Admit: 2023-06-07 | Discharge: 2023-06-07 | Disposition: A | Discharge status: Home / Self Care | Source: Ambulatory Visit | Attending: Family Medicine | Admitting: Family Medicine

## 2023-06-07 DIAGNOSIS — R899 Unspecified abnormal finding in specimens from other organs, systems and tissues: Secondary | ICD-10-CM

## 2023-06-12 ENCOUNTER — Other Ambulatory Visit: Payer: Self-pay | Admitting: Interventional Cardiology

## 2023-06-12 ENCOUNTER — Encounter: Payer: Self-pay | Admitting: Cardiovascular Disease

## 2023-06-12 ENCOUNTER — Other Ambulatory Visit: Payer: Self-pay

## 2023-06-12 MED ORDER — METOPROLOL SUCCINATE ER 25 MG PO TB24
25.0000 mg | ORAL_TABLET | Freq: Two times a day (BID) | ORAL | 3 refills | Status: DC
Start: 2023-06-12 — End: 2023-12-19

## 2023-06-12 MED ORDER — ROSUVASTATIN CALCIUM 20 MG PO TABS: ORAL_TABLET | ORAL | 3 refills | Status: AC

## 2023-06-12 NOTE — Telephone Encounter (Signed)
 New RX's sent to [facility]

## 2023-06-13 ENCOUNTER — Other Ambulatory Visit: Payer: Self-pay | Admitting: Family Medicine

## 2023-06-13 DIAGNOSIS — R935 Abnormal findings on diagnostic imaging of other abdominal regions, including retroperitoneum: Secondary | ICD-10-CM

## 2023-06-20 ENCOUNTER — Emergency Department (HOSPITAL_COMMUNITY)

## 2023-06-20 ENCOUNTER — Encounter: Payer: Self-pay | Admitting: Cardiovascular Disease

## 2023-06-20 ENCOUNTER — Telehealth: Payer: Self-pay | Admitting: Diagnostic Neuroimaging

## 2023-06-20 ENCOUNTER — Other Ambulatory Visit: Payer: Self-pay

## 2023-06-20 ENCOUNTER — Encounter (HOSPITAL_COMMUNITY): Payer: Self-pay | Admitting: *Deleted

## 2023-06-20 ENCOUNTER — Emergency Department (HOSPITAL_COMMUNITY)
Admission: EM | Admit: 2023-06-20 | Discharge: 2023-06-20 | Disposition: A | Discharge status: Home / Self Care | Attending: Emergency Medicine | Admitting: Emergency Medicine

## 2023-06-20 DIAGNOSIS — Z7982 Long term (current) use of aspirin: Secondary | ICD-10-CM | POA: Insufficient documentation

## 2023-06-20 DIAGNOSIS — K148 Other diseases of tongue: Secondary | ICD-10-CM | POA: Diagnosis not present

## 2023-06-20 DIAGNOSIS — G459 Transient cerebral ischemic attack, unspecified: Secondary | ICD-10-CM | POA: Diagnosis not present

## 2023-06-20 DIAGNOSIS — Z79899 Other long term (current) drug therapy: Secondary | ICD-10-CM | POA: Insufficient documentation

## 2023-06-20 DIAGNOSIS — R739 Hyperglycemia, unspecified: Secondary | ICD-10-CM

## 2023-06-20 DIAGNOSIS — R7309 Other abnormal glucose: Secondary | ICD-10-CM | POA: Diagnosis not present

## 2023-06-20 DIAGNOSIS — R202 Paresthesia of skin: Secondary | ICD-10-CM | POA: Diagnosis present

## 2023-06-20 DIAGNOSIS — I2510 Atherosclerotic heart disease of native coronary artery without angina pectoris: Secondary | ICD-10-CM | POA: Diagnosis not present

## 2023-06-20 DIAGNOSIS — I10 Essential (primary) hypertension: Secondary | ICD-10-CM | POA: Diagnosis not present

## 2023-06-20 LAB — RAPID URINE DRUG SCREEN, HOSP PERFORMED
Amphetamines: NOT DETECTED
Barbiturates: NOT DETECTED
Benzodiazepines: NOT DETECTED
Cocaine: NOT DETECTED
Opiates: NOT DETECTED
Tetrahydrocannabinol: NOT DETECTED

## 2023-06-20 LAB — URINALYSIS, ROUTINE W REFLEX MICROSCOPIC
Bacteria, UA: NONE SEEN
Bilirubin Urine: NEGATIVE
Glucose, UA: NEGATIVE mg/dL
Hgb urine dipstick: NEGATIVE
Ketones, ur: NEGATIVE mg/dL
Leukocytes,Ua: NEGATIVE
Nitrite: NEGATIVE
Protein, ur: NEGATIVE mg/dL
Specific Gravity, Urine: 1.01 (ref 1.005–1.030)
pH: 5 (ref 5.0–8.0)

## 2023-06-20 LAB — CBC
HCT: 43.3 % (ref 39.0–52.0)
Hemoglobin: 14.3 g/dL (ref 13.0–17.0)
MCH: 30 pg (ref 26.0–34.0)
MCHC: 33 g/dL (ref 30.0–36.0)
MCV: 90.8 fL (ref 80.0–100.0)
Platelets: 209 10*3/uL (ref 150–400)
RBC: 4.77 MIL/uL (ref 4.22–5.81)
RDW: 14.1 % (ref 11.5–15.5)
WBC: 10.2 10*3/uL (ref 4.0–10.5)
nRBC: 0 % (ref 0.0–0.2)

## 2023-06-20 LAB — COMPREHENSIVE METABOLIC PANEL WITH GFR
ALT: 27 U/L (ref 0–44)
AST: 28 U/L (ref 15–41)
Albumin: 3.8 g/dL (ref 3.5–5.0)
Alkaline Phosphatase: 63 U/L (ref 38–126)
Anion gap: 11 (ref 5–15)
BUN: 15 mg/dL (ref 8–23)
CO2: 24 mmol/L (ref 22–32)
Calcium: 9 mg/dL (ref 8.9–10.3)
Chloride: 102 mmol/L (ref 98–111)
Creatinine, Ser: 1.08 mg/dL (ref 0.61–1.24)
GFR, Estimated: >60 mL/min (ref >60)
Glucose, Bld: 106 mg/dL — ABNORMAL HIGH (ref 70–99)
Potassium: 3.6 mmol/L (ref 3.5–5.1)
Sodium: 137 mmol/L (ref 135–145)
Total Bilirubin: 0.8 mg/dL (ref 0.0–1.2)
Total Protein: 7 g/dL (ref 6.5–8.1)

## 2023-06-20 LAB — LIPASE, BLOOD: Lipase: 40 U/L (ref 11–51)

## 2023-06-20 LAB — CBG MONITORING, ED: Glucose-Capillary: 96 mg/dL (ref 70–99)

## 2023-06-20 MED ORDER — CLOPIDOGREL BISULFATE 75 MG PO TABS: 75.0000 mg | ORAL_TABLET | Freq: Every day | ORAL | 0 refills | Status: DC

## 2023-06-20 MED ORDER — CLOPIDOGREL BISULFATE 75 MG PO TABS
75.0000 mg | ORAL_TABLET | Freq: Once | ORAL | Status: DC
Start: 2023-06-20 — End: 2023-06-20
  Filled 2023-06-20: qty 1

## 2023-06-20 NOTE — Telephone Encounter (Signed)
 Patient called to schedule an appt as instructed by Emergency Room physician. Transferred to New Patient Referrals.

## 2023-06-20 NOTE — ED Notes (Signed)
 PT in MRI.

## 2023-06-20 NOTE — ED Provider Notes (Signed)
 [place] [facility] AT [facility] Provider Note   CSN: [id number] Arrival date & time: 06/20/23  0209     History  Chief complaint: Facial numbness  Noah [name] is a 73 y.o. male.  The history is provided by the patient.  He has history of hypertension, hyperlipidemia, coronary artery disease, paroxysmal atrial fibrillation not on anticoagulation   Home Medications Prior to Admission medications   Medication Sig Start Date End Date Taking? Authorizing Provider  clopidogrel  (PLAVIX ) 75 MG tablet Take 1 tablet (75 mg total) by mouth daily. 06/20/23  Yes Alissa April, MD  aspirin  EC 81 MG tablet Take 1 tablet (81 mg total) by mouth daily. 10/22/17   Marlyse Single T, PA-C  calcium  carbonate (TUMS - DOSED IN MG ELEMENTAL CALCIUM ) 500 MG chewable tablet Chew 1 tablet by mouth daily as needed.    [provider]  Cholecalciferol (VITAMIN D) 2000 UNITS tablet Take 2,000 Units by mouth daily.    [provider]  Flaxseed, Linseed, POWD Take 2 scoop by mouth as directed.    [provider]  Ganciclovir (ZIRGAN) 0.15 % GEL 1 application. As needed for cold sores    [provider]  hydrocortisone 2.5 % cream Apply 1 Application topically. 12/27/22   [provider]  ibuprofen  (ADVIL ,MOTRIN ) 200 MG tablet Take 200 mg by mouth every 6 (six) hours as needed.    [provider]  LORazepam (ATIVAN) 0.5 MG tablet Take 1 tablet by mouth at bedtime as needed for sleep. To help with sleep 07/02/11   [provider]  metoprolol  succinate (TOPROL -XL) 25 MG 24 hr tablet Take 1 tablet (25 mg total) by mouth 2 (two) times daily. 06/12/23   Odie Benne, MD  metoprolol  tartrate (LOPRESSOR ) 25 MG tablet TAKE 1/2 -1 TABLET BY MOUTH AS NEEDED 05/30/21   Varanasi, Jayadeep S, MD  Multiple Vitamin (MULTIVITAMIN WITH MINERALS) TABS Take 1 tablet by mouth daily. Centrum silver    [provider]  nitroGLYCERIN   (NITROSTAT ) 0.4 MG SL tablet Place 1 tablet (0.4 mg total) under the tongue every 5 (five) minutes as needed for chest pain (3 DOSES MAX). 05/21/22   Lucendia Rusk, MD  pantoprazole  (PROTONIX ) 40 MG tablet Take 1 tablet (40 mg total) by mouth daily. 12/20/22   Tobin Forts, MD  pramoxine-hydrocortisone [facility]) cream Apply 1 application topically at bedtime as needed (for skin).     [provider]  pramoxine-hydrocortisone (PROCTOCREAM-HC) 1-1 % rectal cream Apply topically. 05/29/19   [provider]  rosuvastatin  (CRESTOR ) 20 MG tablet TAKE 1 TABLET BY MOUTH DAILY 06/12/23   Odie Benne, MD  Ubiquinol 100 MG CAPS Take 100 mg by mouth daily.    [provider]      Allergies    Cephalexin, Levofloxacin, Zoster vac recomb adjuvanted, Clarithromycin, Penicillins, Ciprofloxacin, Doxycycline, and Lipitor [atorvastatin]    Review of Systems   Review of Systems  All other systems reviewed and are negative.   Physical Exam Updated Vital Signs BP (!) 140/76   Pulse 67   Temp 97.7 F (36.5 C)   Resp (!) 22   Ht 5\' 7"  (1.702 m)   Wt 77.6 kg   SpO2 100%   BMI 26.79 kg/m  Physical Exam Vitals and nursing note reviewed.   73 year old male, resting comfortably and in no acute distress. Vital signs are significant for borderline elevated respiratory rate. Oxygen saturation is 100%, which is  normal. Head is normocephalic and atraumatic. PERRLA, EOMI. Oropharynx is clear. Neck is nontender and supple without adenopathy or JVD. Back is nontender and there is no CVA tenderness. Lungs are clear without rales, wheezes, or rhonchi. Chest is nontender. Heart has regular rate and rhythm without murmur. Abdomen is soft, flat, nontender without masses or hepatosplenomegaly and peristalsis is normoactive. Extremities have no cyanosis or edema, full range of motion is present. Skin is warm and dry without rash. Neurologic: Mental status is normal, cranial  nerves are intact, there are no motor or sensory deficits.  ED Results / Procedures / Treatments   Labs (all labs ordered are listed, but only abnormal results are displayed) Labs Reviewed  COMPREHENSIVE METABOLIC PANEL WITH GFR - Abnormal; Notable for the following components:      Result Value   Glucose, Bld 106 (*)    All other components within normal limits  LIPASE, BLOOD  CBC  URINALYSIS, ROUTINE W REFLEX MICROSCOPIC  RAPID URINE DRUG SCREEN, HOSP PERFORMED  CBG MONITORING, ED    EKG EKG Interpretation Date/Time:  Thursday Jun 20 2023 02:26:43 EDT Ventricular Rate:  66 PR Interval:  206 QRS Duration:  118 QT Interval:  384 QTC Calculation: 402 R Axis:   -21  Text Interpretation: Normal sinus rhythm Right bundle branch block Septal infarct , age undetermined Abnormal ECG When compared with ECG of 11-Dec-2022 10:42, No significant change was found Confirmed by Alissa April ([id number]) on 06/20/2023 2:42:15 AM  Radiology MR MRA NECK WO CONTRAST Result Date: 06/20/2023 CLINICAL DATA:  73 year old male TIA. EXAM: MRA NECK WITHOUT CONTRAST TECHNIQUE: Angiographic images of the neck were acquired using MRA technique without intravenous contrast. Carotid stenosis measurements (when applicable) are obtained utilizing NASCET criteria, using the distal internal carotid diameter as the denominator. COMPARISON:  Brain MRI and intracranial MRA today reported separately. FINDINGS: Noncontrast time-of-flight imaging reveals antegrade flow in the bilateral cervical carotid and vertebral arteries. Vertebral arteries appear to be codominant, and patent in the neck and at the skull base. No evidence of cervical vertebral artery stenosis. Patent Common carotid arteries and carotid bifurcations. The left bifurcation occurs slightly early, and the left ICA has a partially retropharyngeal course (normal variant). Both bifurcations appear regular, without stenosis. Cervical ICAs appear normal to the skull base.  Other: None. IMPRESSION: Negative noncontrast Neck MRA. No evidence of cervical carotid or vertebral artery stenosis. Electronically Signed   By: Marlise Simpers M.D.   On: 06/20/2023 04:44   MR ANGIO HEAD WO CONTRAST Result Date: 06/20/2023 CLINICAL DATA:  73 year old male TIA, transient face numbness, extremity weakness. Hypertensive. EXAM: MRA HEAD WITHOUT CONTRAST TECHNIQUE: Angiographic images of the Circle of Willis were acquired using MRA technique without intravenous contrast. COMPARISON:  Brain MRI today. FINDINGS: Anterior circulation: Antegrade flow in both ICA siphons. Minimal siphon irregularity and no ICA siphon stenosis. Normal ophthalmic artery origins. Normal carotid termini, MCA and ACA origins. Normal anterior communicating artery and visible ACA branches. Bilateral MCA M1 segments and MCA bi/trifurcations are patent without stenosis. Visible bilateral MCA branches appear normal. Posterior circulation: Distal vertebral arteries minimally included but the visible vertebrobasilar junction appears normal. Distal vertebral artery flow voids on MRI today are preserved. Patent basilar artery without stenosis. Patent AICA, SCA and PCA origins appear normal. Posterior communicating arteries are diminutive or absent. Bilateral PCA branches are within normal limits. Anatomic variants: None. Other: Brain MRI today reported separately. IMPRESSION: Negative intracranial MRA. Electronically Signed   By: Arline Laity.D.  On: 06/20/2023 04:37   MR BRAIN WO CONTRAST Result Date: 06/20/2023 CLINICAL DATA:  73 year old male TIA, transient face numbness, extremity weakness. Hypertensive. EXAM: MRI HEAD WITHOUT CONTRAST TECHNIQUE: Multiplanar, multiecho pulse sequences of the brain and surrounding structures were obtained without intravenous contrast. COMPARISON:  Head CT 0338 hours today. Intracranial MRA reported separately. Brain MRI 12/12/2020. FINDINGS: Brain: No restricted diffusion to suggest acute infarction. No  midline shift, mass effect, evidence of mass lesion, ventriculomegaly, extra-axial collection or acute intracranial hemorrhage. Cervicomedullary junction and pituitary are within normal limits. Chronic bilateral cerebral white matter T2 and FLAIR hyperintensity, much of which on series 11, images 38 and 39 seems to be nodular and perpendicular to the lateral ventricles. Additional nonspecific central and scattered subcortical white matter involvement. Temporal lobes are relatively spared. Corpus callosum volume relatively maintained. Chronic microhemorrhage in the right frontal operculum central white matter series 12, and there are occasional chronic micro hemorrhages in the cerebral white matter (right hemisphere series 12, image 42), which are new since 2022. Deep gray nuclei, brainstem and cerebellum appear normal for age. Vascular: Major intracranial vascular flow voids are preserved. Intracranial MRA reported separately. Skull and upper cervical spine: Negative for age visible cervical spine. Visualized bone marrow signal is within normal limits. Sinuses/Orbits: Negative orbits. Minimal paranasal sinus mucosal thickening. Other: Mastoids are clear. Visible internal auditory structures appear normal. Negative visible scalp. However, there is asymmetry of the right tongue base most conspicuous on coronal DWI series 7, image 62, corresponding to an oval roughly 14 mm area of asymmetric soft tissue there series 14, image 21. IMPRESSION: 1. No acute intracranial abnormality. Chronic moderately advanced white matter signal changes, similar to the 2022 MRI. Occasional chronic white matter micro hemorrhages since that time favors chronic small vessel disease, with main differential consideration of chronic demyelinating disease. 2. A 14 mm nodular asymmetry of soft tissue at the right tongue base is noted. Benign retention cyst is possible, but recommend routine follow-up Neck CT with IV contrast and/or ENT evaluation  to exclude a tongue base carcinoma. 3. Intracranial MRA reported separately. Electronically Signed   By: Marlise Simpers M.D.   On: 06/20/2023 04:34   CT HEAD WO CONTRAST Result Date: 06/20/2023 EXAM: CT HEAD WITHOUT 06/20/2023 03:38:46 AM TECHNIQUE: CT of the head was performed without the administration of intravenous contrast. Automated exposure control, iterative reconstruction, and/or weight based adjustment of the mA/kV was utilized to reduce the radiation dose to as low as reasonably achievable. COMPARISON: 04/13/2015 CLINICAL HISTORY: Transient ischemic attack (TIA). FINDINGS: BRAIN AND VENTRICLES: There is no acute intracranial hemorrhage, mass effect or midline shift. No abnormal extra-axial fluid collection. The gray-white differentiation is maintained without evidence of an acute infarct. There is no evidence of hydrocephalus. Mild subcortical and periventricular small vessel ischemic changes. ORBITS: The visualized portion of the orbits demonstrate no acute abnormality. SINUSES: The visualized paranasal sinuses and mastoid air cells demonstrate no acute abnormality. SOFT TISSUES AND SKULL: No acute abnormality of the visualized skull or soft tissues. IMPRESSION: 1. No acute intracranial abnormality. 2. Mild small vessel ischemic changes. Electronically signed by: Zadie Herter MD 06/20/2023 03:41 AM EDT RP Workstation: [id number]    Procedures Procedures    Medications Ordered in ED Medications  clopidogrel  (PLAVIX ) tablet 75 mg (has no administration in time range)    ED Course/ Medical Decision Making/ A&P       ABCD2 Score: 3  Medical Decision Making Amount and/or Complexity of Data Reviewed Labs: ordered. Radiology: ordered.   Transient right-sided facial numbness and left-sided weakness suggestive of brainstem TIA.  Neurologic exam is normal now.  I ordered TIA workup.  CT of head is normal, MRI brain shows no evidence of stroke but incidental finding of  lesion at the base of the tongue on the right.  MRA of brain and neck are all negative for significant occlusion.  I have independently viewed all of these images, and agree with the radiologist's interpretation.  I have reviewed his laboratory test, my interpretation is mildly elevated random glucose level and otherwise normal CBC and comprehensive metabolic panel, negative urinalysis.  His ABCD 2 score is 3 indicating that he can safely be evaluated as an outpatient.  I have ordered a dose of clopidogrel  and I am discharging him with a prescription for clopidogrel .  He has to follow-up with his primary care provider to arrange outpatient CT of neck with contrast to evaluate the lesion on his tongue, and is to follow-up with his neurologist regarding TIA.  Return precautions discussed.  Final Clinical Impression(s) / ED Diagnoses Final diagnoses:  TIA (transient ischemic attack)  Elevated random blood glucose level  Lesion of tongue    Rx / DC Orders ED Discharge Orders          Ordered    clopidogrel  (PLAVIX ) 75 MG tablet  Daily        06/20/23 0502              Alissa April, MD 06/20/23 [phone]

## 2023-06-20 NOTE — ED Triage Notes (Signed)
 He went to sleep and awakened at 0030 with rt sided face numbness that lasted for approx 30 minutes   he felt weakness both arms and legs  bp higher than usual .  No headache or dizziness   now all symptoms have resolved

## 2023-06-20 NOTE — Discharge Instructions (Addendum)
 Continue to take aspirin  once a day.  Please make an appointment with your neurologist.  He may want to order some additional tests.  Your MRI did not show any sign of a stroke, but there is something that the base of your tongue will which was concerning.  It may be a cyst, but your doctor needs to order a CT scan of your neck with contrast to make sure it is not something more serious.

## 2023-06-25 ENCOUNTER — Other Ambulatory Visit

## 2023-06-25 ENCOUNTER — Ambulatory Visit
Admission: RE | Admit: 2023-06-25 | Discharge: 2023-06-25 | Disposition: A | Discharge status: Home / Self Care | Source: Ambulatory Visit | Attending: Family Medicine | Admitting: Family Medicine

## 2023-06-25 DIAGNOSIS — R935 Abnormal findings on diagnostic imaging of other abdominal regions, including retroperitoneum: Secondary | ICD-10-CM

## 2023-06-27 ENCOUNTER — Ambulatory Visit: Admitting: Diagnostic Neuroimaging

## 2023-06-27 ENCOUNTER — Encounter: Payer: Self-pay | Admitting: Diagnostic Neuroimaging

## 2023-06-27 VITALS — BP 130/66 | HR 80 | Ht 68.0 in | Wt 165.6 lb

## 2023-06-27 DIAGNOSIS — R200 Anesthesia of skin: Secondary | ICD-10-CM | POA: Diagnosis not present

## 2023-06-27 DIAGNOSIS — G459 Transient cerebral ischemic attack, unspecified: Secondary | ICD-10-CM

## 2023-06-27 NOTE — Progress Notes (Signed)
 [facility]  PATIENT: Noah [name] DOB: 08-May-1950  REFERRING CLINICIAN: Jimmey Mould, MD HISTORY FROM: patient  REASON FOR VISIT: new consult   HISTORICAL  CHIEF COMPLAINT:  Chief Complaint  Patient presents with   New Patient (Initial Visit)    RM 7, Pt alone. Pt here for f/u for possible TIA end of April. States was told MRI did not determine stroke activity.    HISTORY OF PRESENT ILLNESS:   73 year old male here for evaluation of right nasal numbness.  History of hypertension, hyperlipidemia, single episode of paroxysmal atrial fibrillation following heart surgery.  Just after midnight on 06/20/2023 patient woke up and felt some numbness on the right side of his nose.  This last about 5 to 10 minutes.  Due to his history of heart disease, he was concerned about possibility of stroke and called 911 for evaluation.  He met in the hospital for further workup.  MRI of the brain, MRA head and neck are unremarkable.  He was diagnosed with possible TIA and recommended to follow-up in neurology clinic.  Since that time patient is back to baseline.  No recurrent symptoms.   REVIEW OF SYSTEMS: Full 14 system review of systems performed and negative with exception of: as per HPI.  ALLERGIES: Allergies  Allergen Reactions   Cephalexin Other (See Comments)    REACTION: tachycardia Rapid heart beat   Levofloxacin Other (See Comments)    REACTION: tachycardia Rapid heartbeat   Zoster Vac Recomb Adjuvanted Other (See Comments)    Shakes, drove him crazy    Clarithromycin Other (See Comments)    Mouth got white spots.   Penicillins Rash and Other (See Comments)    REACTION: rash Unsure   Ciprofloxacin Diarrhea   Doxycycline Other (See Comments)   Lipitor [Atorvastatin] Palpitations    Muscle aches on lipitor 80 mg daily    HOME MEDICATIONS: Outpatient Medications Prior to Visit  Medication Sig Dispense Refill   aspirin  EC 81 MG tablet Take 1  tablet (81 mg total) by mouth daily.     calcium  carbonate (TUMS - DOSED IN MG ELEMENTAL CALCIUM ) 500 MG chewable tablet Chew 1 tablet by mouth daily as needed.     Cholecalciferol (VITAMIN D) 2000 UNITS tablet Take 2,000 Units by mouth daily.     Flaxseed, Linseed, POWD Take 2 scoop by mouth as directed.     Ganciclovir (ZIRGAN) 0.15 % GEL 1 application. As needed for cold sores     hydrocortisone 2.5 % cream Apply 1 Application topically daily as needed (Apply topically as needed).     ibuprofen  (ADVIL ,MOTRIN ) 200 MG tablet Take 200 mg by mouth every 6 (six) hours as needed.     LORazepam (ATIVAN) 0.5 MG tablet Take 1 tablet by mouth at bedtime as needed for sleep. To help with sleep     metoprolol  succinate (TOPROL -XL) 25 MG 24 hr tablet Take 1 tablet (25 mg total) by mouth 2 (two) times daily. 180 tablet 3   metoprolol  tartrate (LOPRESSOR ) 25 MG tablet TAKE 1/2 -1 TABLET BY MOUTH AS NEEDED 90 tablet 2   Multiple Vitamin (MULTIVITAMIN WITH MINERALS) TABS Take 1 tablet by mouth daily. Centrum silver     nitroGLYCERIN  (NITROSTAT ) 0.4 MG SL tablet Place 1 tablet (0.4 mg total) under the tongue every 5 (five) minutes as needed for chest pain (3 DOSES MAX). 25 tablet 6   pantoprazole  (PROTONIX ) 40 MG tablet Take 1 tablet (40 mg total) by mouth daily. (Patient  taking differently: Take 40 mg by mouth daily as needed (As needed for GERD).) 90 tablet 3   pramoxine-hydrocortisone (ANALPRAM HC) cream Apply 1 application topically at bedtime as needed (for skin).      pramoxine-hydrocortisone (PROCTOCREAM-HC) 1-1 % rectal cream Apply topically.     rosuvastatin  (CRESTOR ) 20 MG tablet TAKE 1 TABLET BY MOUTH DAILY 90 tablet 3   Ubiquinol 100 MG CAPS Take 100 mg by mouth daily.     clopidogrel  (PLAVIX ) 75 MG tablet Take 1 tablet (75 mg total) by mouth daily. (Patient not taking: Reported on 06/27/2023) 30 tablet 0   Facility-Administered Medications Prior to Visit  Medication Dose Route Frequency Provider Last  Rate Last Admin   regadenoson  (LEXISCAN ) injection SOLN 0.4 mg  0.4 mg Intravenous Once Nahser, Lela Purple, MD        PAST MEDICAL HISTORY: Past Medical History:  Diagnosis Date   Allergy    seasonal- dust mites , tree pollen - worse in Spring    Anxiety    Arthritis    Coronary artery disease 07/13/2005   CABG   DDD (degenerative disc disease)    DJD (degenerative joint disease)    Elevated liver enzymes    GERD (gastroesophageal reflux disease)    Hemangioma of liver    Hemorrhoids    Hyperlipemia    Hypertension    PAC (premature atrial contraction)    Paroxysmal atrial fibrillation (HCC)    1 run of A Fib only- after heart surgery    Pneumonia    PVC (premature ventricular contraction)     PAST SURGICAL HISTORY: Past Surgical History:  Procedure Laterality Date   COLONOSCOPY  05/2020   CORONARY ARTERY BYPASS GRAFT  07/13/2005   INGUINAL HERNIA REPAIR Right 1978, 2000   x 2   KNEE SURGERY Right    SEPTOPLASTY      FAMILY HISTORY: Family History  Problem Relation Age of Onset   Coronary artery disease Father    Heart attack Father    Atrial fibrillation Mother 14   Colon polyps Mother    Seizures Brother    Prostate cancer Maternal Uncle    Heart attack Maternal Grandfather    Heart attack Paternal Grandmother    Heart attack Paternal Grandfather    Colon cancer Neg Hx    Esophageal cancer Neg Hx    Rectal cancer Neg Hx    Stomach cancer Neg Hx     SOCIAL HISTORY: Social History   Socioeconomic History   Marital status: Married    Spouse name: Not on file   Number of children: 1   Years of education: Not on file   Highest education level: Not on file  Occupational History   Occupation: retired    Associate Professor: RETIRED  Tobacco Use   Smoking status: Former    Current packs/day: 0.00    Types: Cigarettes    Quit date: 02/19/1974    Years since quitting: 49.3   Smokeless tobacco: Never   Tobacco comments:    stopped 32 yrs ago  Vaping Use   Vaping  status: Never Used  Substance and Sexual Activity   Alcohol use: Yes    Comment: rare   Drug use: No   Sexual activity: Not on file  Other Topics Concern   Not on file  Social History Narrative   Pt lives in [place] with spouse.  Son is grown.  Retired from [employer].     patient rides bicycle 7 to 8  Mile a day in a 1/2hr.   Social Drivers of Corporate investment banker Strain: Not on file  Food Insecurity: Not on file  Transportation Needs: Not on file  Physical Activity: Not on file  Stress: Not on file  Social Connections: Unknown (07/04/2021)   Received from [facility]   Social Network    Social Network: Not on file  Intimate Partner Violence: Unknown (05/26/2021)   Received from [facility], [facility]   HITS    Physically Hurt: Not on file    Insult or Talk Down To: Not on file    Threaten Physical Harm: Not on file    Scream or Curse: Not on file     PHYSICAL EXAM  GENERAL EXAM/CONSTITUTIONAL: Vitals:  Vitals:   06/27/23 1428  BP: 130/66  Pulse: 80  Weight: 165 lb 9.6 oz (75.1 kg)  Height: 5\' 8"  (1.727 m)   Body mass index is 25.18 kg/m. Wt Readings from Last 3 Encounters:  06/27/23 165 lb 9.6 oz (75.1 kg)  06/20/23 171 lb 1.2 oz (77.6 kg)  05/06/23 171 lb (77.6 kg)   Patient is in no distress; well developed, nourished and groomed; neck is supple  CARDIOVASCULAR: Examination of carotid arteries is normal; no carotid bruits Regular rate and rhythm, no murmurs Examination of peripheral vascular system by observation and palpation is normal  EYES: Ophthalmoscopic exam of optic discs and posterior segments is normal; no papilledema or hemorrhages No results found.  MUSCULOSKELETAL: Gait, strength, tone, movements noted in Neurologic exam below  NEUROLOGIC: MENTAL STATUS:      No data to display         awake, alert, oriented to person, place and time recent and remote memory intact normal attention and  concentration language fluent, comprehension intact, naming intact fund of knowledge appropriate  CRANIAL NERVE:  2nd - no papilledema on fundoscopic exam 2nd, 3rd, 4th, 6th - pupils equal and reactive to light, visual fields full to confrontation, extraocular muscles intact, no nystagmus 5th - facial sensation symmetric 7th - facial strength symmetric 8th - hearing intact 9th - palate elevates symmetrically, uvula midline 11th - shoulder shrug symmetric 12th - tongue protrusion midline  MOTOR:  normal bulk and tone, full strength in the BUE, BLE  SENSORY:  normal and symmetric to light touch, temperature, vibration  COORDINATION:  finger-nose-finger, fine finger movements normal  REFLEXES:  deep tendon reflexes TRACE and symmetric  GAIT/STATION:  narrow based gait     DIAGNOSTIC DATA (LABS, IMAGING, TESTING) - I reviewed patient records, labs, notes, testing and imaging myself where available.  Lab Results  Component Value Date   WBC 10.2 06/20/2023   HGB 14.3 06/20/2023   HCT 43.3 06/20/2023   MCV 90.8 06/20/2023   PLT 209 06/20/2023      Component Value Date/Time   NA 137 06/20/2023 0227   NA 140 11/30/2022 0956   K 3.6 06/20/2023 0227   CL 102 06/20/2023 0227   CO2 24 06/20/2023 0227   GLUCOSE 106 (H) 06/20/2023 0227   BUN 15 06/20/2023 0227   BUN 18 11/30/2022 0956   CREATININE 1.08 06/20/2023 0227   CREATININE 1.03 09/20/2015 1256   CALCIUM  9.0 06/20/2023 0227   PROT 7.0 06/20/2023 0227   PROT 7.1 11/30/2022 0956   ALBUMIN 3.8 06/20/2023 0227   ALBUMIN 4.4 11/30/2022 0956   AST 28 06/20/2023 0227   ALT 27 06/20/2023 0227   ALKPHOS 63 06/20/2023 0227   BILITOT  0.8 06/20/2023 0227   BILITOT 0.8 11/30/2022 0956   GFRNONAA >60 06/20/2023 0227   GFRAA 75 01/29/2020 0827   Lab Results  Component Value Date   CHOL 120 11/30/2022   HDL 47 11/30/2022   LDLCALC 56 11/30/2022   TRIG 84 11/30/2022   CHOLHDL 2.6 11/30/2022   Lab Results  Component  Value Date   HGBA1C 5.4 10/12/2021   Lab Results  Component Value Date   VITAMINB12 758 01/27/2013   Lab Results  Component Value Date   TSH 3.210 10/19/2020    06/20/23 MRI brain [I reviewed images myself and agree with interpretation. Likely was present on MRI from 2022, and appears similar. -VRP]  1. No acute intracranial abnormality. Chronic moderately advanced white matter signal changes, similar to the 2022 MRI. Occasional chronic white matter micro hemorrhages since that time favors chronic small vessel disease, with main differential consideration of chronic demyelinating disease. 2. A 14 mm nodular asymmetry of soft tissue at the right tongue base is noted. Benign retention cyst is possible, but recommend routine follow-up Neck CT with IV contrast and/or ENT evaluation to exclude a tongue base carcinoma.  06/20/23 MRA head - Negative intracranial MRA.  06/20/23 MRA neck  - Negative noncontrast Neck MRA. No evidence of cervical carotid or vertebral artery stenosis.    ASSESSMENT AND PLAN  73 y.o. year old male here with hypertension, hyperlipidemia:   Dx:  1. Numbness   2. TIA (transient ischemic attack)      PLAN:  TRANSIENT RIGHT NASAL NUMBNESS (06/19/23, lasting 5-10 minutes; no other symptoms) - possible TIA; recommend to dual anti-platelet therapy with aspirin  81 + clopidogrel  75 daily x 3 weeks, then aspirin  81mg  daily alone - continue statin, beta-blocker - follow up with cardiology due to history of paroxysmal atrial fibrillation (1 time run after heart surgery); would request their input on longer term cardiac monitoring such as implanted loop recorder and the role for anti-coagulation instead of anti-platelets  RIGHT TONGUE BASE NODULE - agree with ENT evaluation (ordered; pending); the lesion appears to have been present on MRI from 2022 which would favor a benign etiology; also this type of tongue lesion would be less likely to contribute to a  transient numbness symptom  Return for return to PCP, pending if symptoms worsen or fail to improve.    Omega Bible, MD 06/27/2023, 2:47 PM Certified in Neurology, Neurophysiology and Neuroimaging  [facility] [address] [place], [place] [address] [phone]

## 2023-06-27 NOTE — Patient Instructions (Addendum)
  TRANSIENT RIGHT NASAL NUMBNESS (06/19/23, lasting 5-10 minutes; no other symptoms) - possible TIA; recommend to dual anti-platelet therapy with aspirin  81 + clopidogrel  75 daily x 3 weeks, then aspirin  81mg  daily alone - continue statin, beta-blocker - follow up with cardiology due to history of paroxysmal atrial fibrillation (1 time run after heart surgery); would request their input on longer term cardiac monitoring such as implanted loop recorder and the role for anti-coagulation instead of anti-platelets  RIGHT TONGUE BASE NODULE - agree with ENT evaluation (ordered; pending); the lesion appears to have been present on MRI from 2022 which would favor a benign etiology; also this type of tongue lesion would be less likely to contribute to a transient numbness symptom

## 2023-06-28 ENCOUNTER — Encounter: Payer: Self-pay | Admitting: Diagnostic Neuroimaging

## 2023-07-02 NOTE — Telephone Encounter (Signed)
 I called patient. Clarified role of plavix . Noted the potential interactions, but these are likely minor and not a contraindication. -VRP

## 2023-07-08 ENCOUNTER — Encounter: Payer: Self-pay | Admitting: Diagnostic Neuroimaging

## 2023-07-09 ENCOUNTER — Encounter: Payer: Self-pay | Admitting: Cardiovascular Disease

## 2023-07-09 DIAGNOSIS — I2510 Atherosclerotic heart disease of native coronary artery without angina pectoris: Secondary | ICD-10-CM

## 2023-07-09 DIAGNOSIS — R7303 Prediabetes: Secondary | ICD-10-CM

## 2023-07-09 DIAGNOSIS — E782 Mixed hyperlipidemia: Secondary | ICD-10-CM

## 2023-07-09 DIAGNOSIS — R350 Frequency of micturition: Secondary | ICD-10-CM

## 2023-07-09 NOTE — Telephone Encounter (Signed)
 I called patient. Ok to continue plavix  if he feels that side effects are under control. Also reviewed MRI results and to continue medical mgmt. -VRP

## 2023-07-18 ENCOUNTER — Encounter: Payer: Self-pay | Admitting: Diagnostic Neuroimaging

## 2023-08-19 ENCOUNTER — Other Ambulatory Visit: Payer: Self-pay | Admitting: Family Medicine

## 2023-08-19 DIAGNOSIS — M545 Low back pain, unspecified: Secondary | ICD-10-CM

## 2023-08-19 DIAGNOSIS — R972 Elevated prostate specific antigen [PSA]: Secondary | ICD-10-CM

## 2023-08-19 DIAGNOSIS — R935 Abnormal findings on diagnostic imaging of other abdominal regions, including retroperitoneum: Secondary | ICD-10-CM

## 2023-08-25 ENCOUNTER — Encounter: Payer: Self-pay | Admitting: Cardiovascular Disease

## 2023-08-29 ENCOUNTER — Ambulatory Visit
Admission: RE | Admit: 2023-08-29 | Discharge: 2023-08-29 | Disposition: A | Discharge status: Home / Self Care | Source: Ambulatory Visit | Attending: Family Medicine | Admitting: Family Medicine

## 2023-08-29 DIAGNOSIS — R935 Abnormal findings on diagnostic imaging of other abdominal regions, including retroperitoneum: Secondary | ICD-10-CM

## 2023-08-29 DIAGNOSIS — R972 Elevated prostate specific antigen [PSA]: Secondary | ICD-10-CM

## 2023-08-29 DIAGNOSIS — M545 Low back pain, unspecified: Secondary | ICD-10-CM

## 2023-09-16 ENCOUNTER — Encounter: Payer: Self-pay | Admitting: Diagnostic Neuroimaging

## 2023-09-26 ENCOUNTER — Encounter: Payer: Self-pay | Admitting: Cardiovascular Disease

## 2023-10-14 ENCOUNTER — Telehealth: Payer: Self-pay

## 2023-10-14 NOTE — Telephone Encounter (Signed)
   Pre-operative Risk Assessment    Patient Name: Noah [name]  DOB: April 25, 1950 MRN: [id number]   Date of last office visit: 05/06/23 LONNI CASH, MD Date of next office visit: 11/18/23 LONNI CASH, MD   Request for Surgical Clearance    Procedure:  LEFT TRIGGER THUMB RELEASE  Date of Surgery:  Clearance TBD                                Surgeon:  FONDA OLMSTED, MD Surgeon's Group or Practice Name:  [facility] Phone number:  [phone]   EXT 3132 Fax number:  [phone]   ATTN: MAEOLA DIVERS   Type of Clearance Requested:   - Medical  - Pharmacy:  Hold Aspirin  and Clopidogrel  (Plavix )     Type of Anesthesia:  CHOICE   Additional requests/questions:    [name] [name]   10/14/2023, 4:36 PM

## 2023-10-15 ENCOUNTER — Telehealth: Payer: Self-pay

## 2023-10-15 NOTE — Telephone Encounter (Signed)
 Primary Cardiologist:Christopher Verlin, MD   Preoperative team, please contact this patient and set up a phone call appointment if patient plans to have surgery prior to next appointment on 9/29 with Dr. Verlin for further preoperative risk assessment. Please obtain consent and complete medication review. Thank you for your help.   I confirm that guidance regarding antiplatelet and oral anticoagulation therapy has been completed and, if necessary, noted below.  Plavix  is not managed by cardiology.   Ideally aspirin  should be continued without interruption, however if the bleeding risk is too great, aspirin  may be held for 5-7 days prior to surgery. Please resume aspirin  post operatively when it is felt to be safe from a bleeding standpoint.   I also confirmed the patient resides in the state of [place] . As per [facility] Medical Board telemedicine laws, the patient must reside in the state in which the provider is licensed.   Rosaline EMERSON Bane, NP-C  10/15/2023, 11:24 AM [address], [address] [place], [place] [address] Office [phone] Fax [phone]

## 2023-10-15 NOTE — Telephone Encounter (Signed)
 Patient has been scheduled for televisit med rec and consent done     Patient Consent for Virtual Visit         Noah [name] has provided verbal consent on 10/15/2023 for a virtual visit (video or telephone).   CONSENT FOR VIRTUAL VISIT FOR:  Noah [name]  By participating in this virtual visit I agree to the following:  I hereby voluntarily request, consent and authorize [facility] and its employed or contracted physicians, physician assistants, nurse practitioners or other licensed health care professionals (the Practitioner), to provide me with telemedicine health care services (the "Services) as deemed necessary by the treating Practitioner. I acknowledge and consent to receive the Services by the Practitioner via telemedicine. I understand that the telemedicine visit will involve communicating with the Practitioner through live audiovisual communication technology and the disclosure of certain medical information by electronic transmission. I acknowledge that I have been given the opportunity to request an in-person assessment or other available alternative prior to the telemedicine visit and am voluntarily participating in the telemedicine visit.  I understand that I have the right to withhold or withdraw my consent to the use of telemedicine in the course of my care at any time, without affecting my right to future care or treatment, and that the Practitioner or I may terminate the telemedicine visit at any time. I understand that I have the right to inspect all information obtained and/or recorded in the course of the telemedicine visit and may receive copies of available information for a reasonable fee.  I understand that some of the potential risks of receiving the Services via telemedicine include:  Delay or interruption in medical evaluation due to technological equipment failure or disruption; Information transmitted may not be sufficient (e.g. poor resolution  of images) to allow for appropriate medical decision making by the Practitioner; and/or  In rare instances, security protocols could fail, causing a breach of personal health information.  Furthermore, I acknowledge that it is my responsibility to provide information about my medical history, conditions and care that is complete and accurate to the best of my ability. I acknowledge that Practitioner's advice, recommendations, and/or decision may be based on factors not within their control, such as incomplete or inaccurate data provided by me or distortions of diagnostic images or specimens that may result from electronic transmissions. I understand that the practice of medicine is not an exact science and that Practitioner makes no warranties or guarantees regarding treatment outcomes. I acknowledge that a copy of this consent can be made available to me via my patient portal [facility] MyChart), or I can request a printed copy by calling the office of [facility].    I understand that my insurance will be billed for this visit.   I have read or had this consent read to me. I understand the contents of this consent, which adequately explains the benefits and risks of the Services being provided via telemedicine.  I have been provided ample opportunity to ask questions regarding this consent and the Services and have had my questions answered to my satisfaction. I give my informed consent for the services to be provided through the use of telemedicine in my medical care

## 2023-10-15 NOTE — Telephone Encounter (Signed)
Patient has been scheduled for televisit.

## 2023-10-24 ENCOUNTER — Ambulatory Visit: Attending: Cardiology | Admitting: Student

## 2023-10-24 DIAGNOSIS — Z01810 Encounter for preprocedural cardiovascular examination: Secondary | ICD-10-CM | POA: Diagnosis not present

## 2023-10-24 NOTE — Progress Notes (Signed)
 Virtual Visit via Telephone Note   Because of Noah [name]'s co-morbid illnesses, he is at least at moderate risk for complications without adequate follow up.  This format is felt to be most appropriate for this patient at this time.  The patient did not have access to video technology/had technical difficulties with video requiring transitioning to audio format only (telephone).  All issues noted in this document were discussed and addressed.  No physical exam could be performed with this format.  Please refer to the patient's chart for his consent to telehealth for [facility].  Evaluation Performed:  Preoperative cardiovascular risk assessment _____________   Date:  10/24/2023   Patient ID:  Noah [name], DOB 02/24/1950, MRN [id number] Patient Location:  Home Provider location:   Office  Primary Care Provider:  Okey Carlin Redbird, MD Primary Cardiologist:  Noah Cash, MD  Chief Complaint / Patient Profile   73 y.o. 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 Josefina and presents today for telephonic preoperative cardiovascular risk assessment.  History of Present Illness    Noah [name] is a 74 y.o. male who presents via audio/video conferencing for a telehealth visit today.  Pt was last seen in cardiology clinic on 05/06/2023 by Dr. Cash.  At that time Noah [name] was stable from a cardiac standpoint.  The patient is now pending procedure as outlined above. Since his last visit, he is doing well. Patient denies shortness of breath, dyspnea on exertion, lower extremity edema, orthopnea or PND. No chest pain, pressure, or tightness. Patient reports occasional palpitations unchanged from previous. He is not experiencing lightheadedness, dizziness, presyncope or syncope. He is very active playing golf and  riding the recumbent bike for 1 hour (12-14 miles) daily.   Past Medical History    Past Medical History:  Diagnosis Date   Allergy    seasonal- dust mites , tree pollen - worse in Spring    Anxiety    Arthritis    Coronary artery disease 07/13/2005   CABG   DDD (degenerative disc disease)    DJD (degenerative joint disease)    Elevated liver enzymes    GERD (gastroesophageal reflux disease)    Hemangioma of liver    Hemorrhoids    Hyperlipemia    Hypertension    PAC (premature atrial contraction)    Paroxysmal atrial fibrillation (HCC)    1 run of A Fib only- after heart surgery    Pneumonia    PVC (premature ventricular contraction)    Past Surgical History:  Procedure Laterality Date   COLONOSCOPY  05/2020   CORONARY ARTERY BYPASS GRAFT  07/13/2005   INGUINAL HERNIA REPAIR Right 1978, 2000   x 2   KNEE SURGERY Right    SEPTOPLASTY      Allergies  Allergies  Allergen Reactions   Cephalexin Other (See Comments)    REACTION: tachycardia Rapid heart beat   Levofloxacin Other (See Comments)    REACTION: tachycardia Rapid heartbeat   Zoster Vac Recomb Adjuvanted Other (See Comments)    Shakes, drove him crazy    Clarithromycin Other (See Comments)    Mouth got white spots.   Penicillins Rash and Other (See Comments)    REACTION: rash Unsure   Ciprofloxacin Diarrhea   Doxycycline Other (See Comments)   Lipitor [Atorvastatin] Palpitations    Muscle aches on lipitor 80 mg  daily    Home Medications    Prior to Admission medications   Medication Sig Start Date End Date Taking? Authorizing Provider  aspirin  EC 81 MG tablet Take 1 tablet (81 mg total) by mouth daily. 10/22/17   Lelon Hamilton T, PA-C  calcium  carbonate (TUMS - DOSED IN MG ELEMENTAL CALCIUM ) 500 MG chewable tablet Chew 1 tablet by mouth daily as needed.    [provider]  Cholecalciferol (VITAMIN D) 2000 UNITS tablet Take 2,000 Units by mouth daily.    [provider]  Flaxseed,  Linseed, POWD Take 2 scoop by mouth as directed.    [provider]  Ganciclovir (ZIRGAN) 0.15 % GEL 1 application. As needed for cold sores    [provider]  hydrocortisone 2.5 % cream Apply 1 Application topically daily as needed (Apply topically as needed). 12/27/22   [provider]  ibuprofen  (ADVIL ,MOTRIN ) 200 MG tablet Take 200 mg by mouth every 6 (six) hours as needed.    [provider]  LORazepam (ATIVAN) 0.5 MG tablet Take 1 tablet by mouth at bedtime as needed for sleep. To help with sleep 07/02/11   [provider]  metoprolol  succinate (TOPROL -XL) 25 MG 24 hr tablet Take 1 tablet (25 mg total) by mouth 2 (two) times daily. 06/12/23   Verlin Noah BIRCH, MD  metoprolol  tartrate (LOPRESSOR ) 25 MG tablet TAKE 1/2 -1 TABLET BY MOUTH AS NEEDED 05/30/21   Varanasi, Jayadeep S, MD  Multiple Vitamin (MULTIVITAMIN WITH MINERALS) TABS Take 1 tablet by mouth daily. Centrum silver    [provider]  nitroGLYCERIN  (NITROSTAT ) 0.4 MG SL tablet Place 1 tablet (0.4 mg total) under the tongue every 5 (five) minutes as needed for chest pain (3 DOSES MAX). 05/21/22   Dann Candyce RAMAN, MD  pantoprazole  (PROTONIX ) 40 MG tablet Take 1 tablet (40 mg total) by mouth daily. Patient taking differently: Take 40 mg by mouth daily as needed (As needed for GERD). 12/20/22   Abran Norleen SAILOR, MD  pramoxine-hydrocortisone [facility]) cream Apply 1 application topically at bedtime as needed (for skin).     [provider]  pramoxine-hydrocortisone (PROCTOCREAM-HC) 1-1 % rectal cream Apply topically. 05/29/19   [provider]  rosuvastatin  (CRESTOR ) 20 MG tablet TAKE 1 TABLET BY MOUTH DAILY 06/12/23   Verlin Noah BIRCH, MD  Ubiquinol 100 MG CAPS Take 100 mg by mouth daily.    [provider]    Physical Exam    Vital Signs:  Noah [name] does not have vital signs available for review today.  Given telephonic nature of  communication, physical exam is limited. AAOx3. NAD. Normal affect.  Speech and respirations are unlabored.   Assessment & Plan    Primary Cardiologist: Noah Verlin, MD  Preoperative cardiovascular risk assessment.  Left thumb trigger release by Dr. Josefina  Chart reviewed as part of pre-operative protocol coverage. According to the RCRI, patient has a 6.6% risk of MACE. Patient reports activity equivalent to >4.0 METS (plays golf, 1 hr recumbent bike for 12-14 miles daily).   Given past medical history and time since last visit, based on ACC/AHA guidelines, BOWDEN BOODY would be at acceptable risk for the planned procedure without further cardiovascular testing.   Patient was advised that if he develops new symptoms prior to surgery to contact our office to arrange a follow-up appointment.  he verbalized understanding.  Ideally aspirin  should be continued without interruption, however if the bleeding risk is too great, aspirin   may be held for 5-7 days prior to surgery. Please resume aspirin  post operatively when it is felt to be safe from a bleeding standpoint. Patient is not on Plavix .     I will route this recommendation to the requesting party via Epic fax function.  Please call with questions.  Time:   Today, I have spent 5 minutes with the patient with telehealth technology discussing medical history, symptoms, and management plan.     Barnie Hila, NP  10/24/2023, 8:43 AM

## 2023-11-11 ENCOUNTER — Telehealth: Payer: Self-pay | Admitting: Cardiovascular Disease

## 2023-11-11 NOTE — Telephone Encounter (Signed)
 Called and spoke with patient. Patient states he will be going to get labs done tomorrow and wants to confirm that lab orders are still in chart because they were ordered in April. Confirmed with patient that he has active orders for labs: Lipid profile, NMR lipoprofile and CMET. Patient verbalized understanding.

## 2023-11-11 NOTE — Telephone Encounter (Signed)
 Pt will be going to get labs done tomorrow and wants to make sure lab orders get sent to lab. Please advise.

## 2023-11-13 ENCOUNTER — Encounter: Payer: Self-pay | Admitting: Cardiovascular Disease

## 2023-11-13 ENCOUNTER — Ambulatory Visit: Payer: Self-pay | Admitting: Cardiovascular Disease

## 2023-11-13 LAB — COMPREHENSIVE METABOLIC PANEL WITH GFR
ALT: 27 IU/L (ref 0–44)
AST: 25 IU/L (ref 0–40)
Albumin: 4.5 g/dL (ref 3.8–4.8)
Alkaline Phosphatase: 93 IU/L (ref 47–123)
BUN/Creatinine Ratio: 13 (ref 10–24)
BUN: 14 mg/dL (ref 8–27)
Bilirubin Total: 0.8 mg/dL (ref 0.0–1.2)
CO2: 21 mmol/L (ref 20–29)
Calcium: 9.6 mg/dL (ref 8.6–10.2)
Chloride: 100 mmol/L (ref 96–106)
Creatinine, Ser: 1.06 mg/dL (ref 0.76–1.27)
Globulin, Total: 2.6 g/dL (ref 1.5–4.5)
Glucose: 83 mg/dL (ref 70–99)
Potassium: 4.2 mmol/L (ref 3.5–5.2)
Sodium: 139 mmol/L (ref 134–144)
Total Protein: 7.1 g/dL (ref 6.0–8.5)
eGFR: 74 mL/min/1.73 (ref >59)

## 2023-11-13 LAB — LIPID PANEL
Chol/HDL Ratio: 2.7 ratio (ref 0.0–5.0)
Cholesterol, Total: 119 mg/dL (ref 100–199)
HDL: 44 mg/dL (ref >39)
LDL Chol Calc (NIH): 57 mg/dL (ref 0–99)
Triglycerides: 97 mg/dL (ref 0–149)
VLDL Cholesterol Cal: 18 mg/dL (ref 5–40)

## 2023-11-13 LAB — NMR, LIPOPROFILE
Cholesterol, Total: 128 mg/dL (ref 100–199)
HDL Particle Number: 34.9 umol/L (ref >=30.5)
HDL-C: 53 mg/dL (ref >39)
LDL Particle Number: 684 nmol/L (ref <1000)
LDL Size: 19.9 nm — ABNORMAL LOW (ref >20.5)
LDL-C (NIH Calc): 56 mg/dL (ref 0–99)
LP-IR Score: 39 (ref <=45)
Small LDL Particle Number: 430 nmol/L (ref <=527)
Triglycerides: 100 mg/dL (ref 0–149)

## 2023-11-18 ENCOUNTER — Encounter: Payer: Self-pay | Admitting: Cardiovascular Disease

## 2023-11-18 ENCOUNTER — Ambulatory Visit: Attending: Cardiovascular Disease | Admitting: Cardiovascular Disease

## 2023-11-18 VITALS — BP 148/78 | HR 68 | Ht 68.0 in | Wt 166.6 lb

## 2023-11-18 DIAGNOSIS — Z01810 Encounter for preprocedural cardiovascular examination: Secondary | ICD-10-CM | POA: Diagnosis not present

## 2023-11-18 DIAGNOSIS — E782 Mixed hyperlipidemia: Secondary | ICD-10-CM | POA: Diagnosis not present

## 2023-11-18 DIAGNOSIS — I2510 Atherosclerotic heart disease of native coronary artery without angina pectoris: Secondary | ICD-10-CM | POA: Diagnosis not present

## 2023-11-18 DIAGNOSIS — I10 Essential (primary) hypertension: Secondary | ICD-10-CM | POA: Diagnosis not present

## 2023-11-18 DIAGNOSIS — I493 Ventricular premature depolarization: Secondary | ICD-10-CM

## 2023-11-18 NOTE — Patient Instructions (Signed)
 Medication Instructions:  Your physician recommends that you continue on your current medications as directed. Please refer to the Current Medication list given to you today.  *If you need a refill on your cardiac medications before your next appointment, please call your pharmacy*  Lab Work: Have lab work (magnesium ) checked today in the lab on the first floor If you have labs (blood work) drawn today and your tests are completely normal, you will receive your results only by: MyChart Message (if you have MyChart) OR A paper copy in the mail If you have any lab test that is abnormal or we need to change your treatment, we will call you to review the results.  Testing/Procedures: none  Follow-Up: At [facility], you and your health needs are our priority.  As part of our continuing mission to provide you with exceptional heart care, our providers are all part of one team.  This team includes your primary Cardiologist (physician) and Advanced Practice Providers or APPs (Physician Assistants and Nurse Practitioners) who all work together to provide you with the care you need, when you need it.  Your next appointment:   12 month(s)  Provider:   Lonni Cash, MD    We recommend signing up for the patient portal called MyChart.  Sign up information is provided on this After Visit Summary.  MyChart is used to connect with patients for Virtual Visits (Telemedicine).  Patients are able to view lab/test results, encounter notes, upcoming appointments, etc.  Non-urgent messages can be sent to your provider as well.   To learn more about what you can do with MyChart, go to [email or web address].   Other Instructions

## 2023-11-18 NOTE — Progress Notes (Signed)
 Chief Complaint  Patient presents with   Follow-up    CAD   History of Present Illness: 73 yo [name] with history of anxiety, CAD s/p 3V CABG, GERD, RBBB, HTN, hyperlipidemia, PAF, PACs, PVCs here today for follow up. He has been followed by Dr. Dann. He had 3V CABG in 2007 (LIMA to LAD, free RIMA to OM, SVG to PDA). Nuclear stress test in March 2022 with no ischemia. Cardiac monitor in December 2023 with sinus, PACs, PVCs, brief runs of SVT, no atrial fibrillation. Carotid artery dopplers in March 2024 with no significant disease. Echo August 2024 with LVEF=60-Noah [name]%. No significant valve disease. I saw him in October 2024 and he was doing well. New RBBB on EKG in October 2024. He called into our office February 2025 and reported dizziness which occurred after eating dark chocolate. 3 day cardiac monitor March 2025 with sinus, several runs of SVT, longest 14 beats. His diary of symptoms correlated with PVCs and PACs but not with the SVT.  Toprol  was increased to 25 mg BID.    He is here today for follow up. The patient denies any chest pain, dyspnea, palpitations, lower extremity edema, orthopnea, PND, dizziness, near syncope or syncope.   Primary Care Physician: Okey Carlin Redbird, MD   Past Medical History:  Diagnosis Date   Allergy    seasonal- dust mites , tree pollen - worse in Spring    Anxiety    Arthritis    Coronary artery disease 07/13/2005   CABG   DDD (degenerative disc disease)    DJD (degenerative joint disease)    Elevated liver enzymes    GERD (gastroesophageal reflux disease)    Hemangioma of liver    Hemorrhoids    Hyperlipemia    Hypertension    PAC (premature atrial contraction)    Paroxysmal atrial fibrillation (HCC)    1 run of A Fib only- after heart surgery    Pneumonia    PVC (premature ventricular contraction)     Past Surgical History:  Procedure Laterality Date   COLONOSCOPY  05/2020   CORONARY ARTERY BYPASS GRAFT  07/13/2005   INGUINAL HERNIA  REPAIR Right 1978, 2000   x 2   KNEE SURGERY Right    SEPTOPLASTY      Current Outpatient Medications  Medication Sig Dispense Refill   aspirin  EC 81 MG tablet Take 1 tablet (81 mg total) by mouth daily.     calcium  carbonate (TUMS - DOSED IN MG ELEMENTAL CALCIUM ) 500 MG chewable tablet Chew 1 tablet by mouth daily as needed.     Cholecalciferol (VITAMIN D) 2000 UNITS tablet Take 2,000 Units by mouth daily.     Coenzyme Q10 100 MG capsule Take 100 mg by mouth daily.     Flaxseed, Linseed, POWD Take 2 scoop by mouth as directed.     Ganciclovir (ZIRGAN) 0.15 % GEL 1 application. As needed for cold sores     hydrocortisone 2.5 % cream Apply 1 Application topically daily as needed (Apply topically as needed).     ibuprofen  (ADVIL ,MOTRIN ) 200 MG tablet Take 200 mg by mouth every 6 (six) hours as needed.     LORazepam (ATIVAN) 0.5 MG tablet Take 1 tablet by mouth at bedtime as needed for sleep. To help with sleep     metoprolol  succinate (TOPROL -XL) 25 MG 24 hr tablet Take 1 tablet (25 mg total) by mouth 2 (two) times daily. 180 tablet 3   metoprolol  tartrate (LOPRESSOR ) 25 MG  tablet TAKE 1/2 -1 TABLET BY MOUTH AS NEEDED 90 tablet 2   Multiple Vitamin (MULTIVITAMIN WITH MINERALS) TABS Take 1 tablet by mouth daily. Centrum silver     nitroGLYCERIN  (NITROSTAT ) 0.4 MG SL tablet Place 1 tablet (0.4 mg total) under the tongue every 5 (five) minutes as needed for chest pain (3 DOSES MAX). 25 tablet 6   pantoprazole  (PROTONIX ) 40 MG tablet Take 1 tablet (40 mg total) by mouth daily. 90 tablet 3   pramoxine-hydrocortisone (ANALPRAM HC) cream Apply 1 application topically at bedtime as needed (for skin).      pramoxine-hydrocortisone (PROCTOCREAM-HC) 1-1 % rectal cream Apply topically.     rosuvastatin  (CRESTOR ) 20 MG tablet TAKE 1 TABLET BY MOUTH DAILY 90 tablet 3   Ubiquinol 100 MG CAPS Take 100 mg by mouth daily.     No current facility-administered medications for this visit.    Facility-Administered Medications Ordered in Other Visits  Medication Dose Route Frequency Provider Last Rate Last Admin   regadenoson  (LEXISCAN ) injection SOLN 0.4 mg  0.4 mg Intravenous Once Nahser, Aleene PARAS, MD        Allergies  Allergen Reactions   Cephalexin Other (See Comments)    REACTION: tachycardia Rapid heart beat   Levofloxacin Other (See Comments)    REACTION: tachycardia Rapid heartbeat   Zoster Vac Recomb Adjuvanted Other (See Comments)    Shakes, drove him crazy    Clarithromycin Other (See Comments)    Mouth got white spots.   Penicillins Rash and Other (See Comments)    REACTION: rash Unsure   Ciprofloxacin Diarrhea   Doxycycline Other (See Comments)   Lipitor [Atorvastatin] Palpitations    Muscle aches on lipitor 80 mg daily    Social History   Socioeconomic History   Marital status: Married    Spouse name: Not on file   Number of children: 1   Years of education: Not on file   Highest education level: Not on file  Occupational History   Occupation: retired    Associate Professor: RETIRED  Tobacco Use   Smoking status: Former    Current packs/day: 0.00    Types: Cigarettes    Quit date: 02/19/1974    Years since quitting: 49.7   Smokeless tobacco: Never   Tobacco comments:    stopped 32 yrs ago  Vaping Use   Vaping status: Never Used  Substance and Sexual Activity   Alcohol use: Yes    Comment: rare   Drug use: No   Sexual activity: Not on file  Other Topics Concern   Not on file  Social History Narrative   Pt lives in [place] with spouse.  Son is grown.  Retired from [employer].     patient rides bicycle 7 to 8  Mile a day in a 1/2hr.   Social Drivers of Corporate investment banker Strain: Not on file  Food Insecurity: Not on file  Transportation Needs: Not on file  Physical Activity: Not on file  Stress: Not on file  Social Connections: Unknown (07/04/2021)   Received from [facility]   Social Network    Social Network: Not on file   Intimate Partner Violence: Unknown (05/26/2021)   Received from [facility]   HITS    Physically Hurt: Not on file    Insult or Talk Down To: Not on file    Threaten Physical Harm: Not on file    Scream or Curse: Not on file    Family History  Problem  Relation Age of Onset   Coronary artery disease Father    Heart attack Father    Atrial fibrillation Mother 90   Colon polyps Mother    Seizures Brother    Prostate cancer Maternal Uncle    Heart attack Maternal Grandfather    Heart attack Paternal Grandmother    Heart attack Paternal Grandfather    Colon cancer Neg Hx    Esophageal cancer Neg Hx    Rectal cancer Neg Hx    Stomach cancer Neg Hx     Review of Systems:  As stated in the HPI and otherwise negative.   BP (!) 148/78   Pulse 68   Ht 5' 8 (1.727 m)   Wt 166 lb 9.6 oz (75.6 kg)   SpO2 97%   BMI 25.33 kg/m   Physical Examination:  General: Well developed, well nourished, NAD  HEENT: OP clear, mucus membranes moist  SKIN: warm, dry. No rashes. Neuro: No focal deficits  Musculoskeletal: Muscle strength 5/5 all ext  Psychiatric: Mood and affect normal  Neck: No JVD, no carotid bruits, no thyromegaly, no lymphadenopathy.  Lungs:Clear bilaterally, no wheezes, rhonci, crackles Cardiovascular: Regular rate and rhythm. No murmurs, gallops or rubs. Abdomen:Soft. Bowel sounds present. Non-tender.  Extremities: No lower extremity edema. Pulses are 2 + in the bilateral DP/PT.  EKG:  EKG is not ordered today. The ekg ordered today demonstrates   Recent Labs: 06/20/2023: Hemoglobin 14.3; Platelets 209 11/12/2023: ALT 27; BUN 14; Creatinine, Ser 1.06; Potassium 4.2; Sodium 139   Lipid Panel    Component Value Date/Time   CHOL 119 11/12/2023 0927   CHOL 121 02/09/2016 1206   CHOL 124 07/28/2014 0903   TRIG 97 11/12/2023 0927   TRIG 108 01/23/2017 1030   TRIG 112 07/28/2014 0903   HDL 44 11/12/2023 0927   HDL 46 01/23/2017 1030   HDL 46 07/28/2014 0903    CHOLHDL 2.7 11/12/2023 0927   CHOLHDL 2.5 02/09/2016 1206   LDLCALC 57 11/12/2023 0927   LDLCALC 53 02/09/2016 1206   LDLCALC 56 07/28/2014 0903     Wt Readings from Last 3 Encounters:  11/18/23 166 lb 9.6 oz (75.6 kg)  06/27/23 165 lb 9.6 oz (75.1 kg)  06/20/23 171 lb 1.2 oz (77.6 kg)    Assessment and Plan:   1. CAD s/p CABG without angina: No chest pain. Continue ASA, statin and beta blocker.    2. Hyperlipidemia: LDL 57 in September 2025. NMR profile reviewed. Continue statin  3. HTN: BP is controlled. Continue Toprol   4. PVCs/PACs/SVT: Rare palpitations. Continue Toprol   5. Pre-operative cardiovascular examination: He has no concerning cardiac symptoms. Cardiac exam is stable. He is very active. He can proceed with his planned surgical procedure.   Pt is asking for his magnesium  level to be checked today  Labs/ tests ordered today include:  No orders of the defined types were placed in this encounter.  Disposition:   F/U with me in one year   Signed, Lonni Cash, MD, [facility] 11/18/2023 8:38 AM    [facility] [address] [place], [place], [place]  [address] Phone: [phone]; Fax: [phone]

## 2023-11-19 ENCOUNTER — Ambulatory Visit: Payer: Self-pay | Admitting: Cardiovascular Disease

## 2023-11-19 LAB — MAGNESIUM: Magnesium: 2.3 mg/dL (ref 1.6–2.3)

## 2023-12-03 ENCOUNTER — Encounter (HOSPITAL_BASED_OUTPATIENT_CLINIC_OR_DEPARTMENT_OTHER): Payer: Self-pay | Admitting: Orthopedic Surgery

## 2023-12-03 ENCOUNTER — Other Ambulatory Visit: Payer: Self-pay

## 2023-12-03 NOTE — Progress Notes (Signed)
   12/03/23 1057  PAT Phone Screen  Is the patient taking a GLP-1 receptor agonist? No  Do You Have Diabetes? No  Do You Have Hypertension? Yes  Have You Ever Been to the ER for Asthma? No  Have You Taken Oral Steroids in the Past 3 Months? No  Do you Take Phenteramine or any Other Diet Drugs? No  Recent  Lab Work, EKG, CXR? Yes  Where was this test performed? 06-20-23 EKG- SR with RBBB  Do you have a history of heart problems? (S)  Yes (CAD, PVC's, PAC'c, SVT)  Cardiologist Name Dr Barbette for CAD-CABG 2007  Have you ever had tests on your heart? Yes  What cardiac tests were performed? Echo;Stress Test  What date/year were cardiac tests completed? 09-2022 ECHO EF 60-65%, 05-02-23 Holter monitor showing bursts SVT, 2022 NM stress test-no ischemia  Results viewable: [facility] Media Tab  Any Recent Hospitalizations? No  Height 5' 8 (1.727 m)  Weight 75.6 kg  Pat Appointment Scheduled No  Reason for No Appointment Not Needed

## 2023-12-06 NOTE — H&P (Signed)
 PREOPERATIVE H&P  Chief Complaint: left thumb pain  HPI: Noah [name] is a 73 y.o. male who presents for preoperative history and physical with a diagnosis of left trigger thumb. SABRA He has had significant triggering in the thumb. It pops and locks and catches and is severely painful. He does not really want injections for this. He wants this definitively managed. We have done injections for multiple trigger fingers before.He has elected for surgical management.   Past Medical History:  Diagnosis Date   Allergy    seasonal- dust mites , tree pollen - worse in Spring    Anxiety    Arthritis    Coronary artery disease 07/13/2005   CABG   DDD (degenerative disc disease)    DJD (degenerative joint disease)    Elevated liver enzymes    GERD (gastroesophageal reflux disease)    Hemangioma of liver    Hemorrhoids    Hyperlipemia    Hypertension    PAC (premature atrial contraction)    Paroxysmal atrial fibrillation (HCC)    1 run of A Fib only- after heart surgery    Pneumonia    PVC (premature ventricular contraction)    Past Surgical History:  Procedure Laterality Date   COLONOSCOPY  05/2020   CORONARY ARTERY BYPASS GRAFT  07/13/2005   INGUINAL HERNIA REPAIR Right 1978, 2000   x 2   KNEE SURGERY Right    SEPTOPLASTY     Social History   Socioeconomic History   Marital status: Married    Spouse name: Not on file   Number of children: 1   Years of education: Not on file   Highest education level: Not on file  Occupational History   Occupation: retired    Associate Professor: RETIRED  Tobacco Use   Smoking status: Former    Current packs/day: 0.00    Types: Cigarettes    Quit date: 02/19/1974    Years since quitting: 49.8   Smokeless tobacco: Never   Tobacco comments:    stopped 32 yrs ago  Vaping Use   Vaping status: Never Used  Substance and Sexual Activity   Alcohol use: Yes    Comment: rare   Drug use: No   Sexual activity: Not on file  Other Topics Concern   Not  on file  Social History Narrative   Pt lives in [place] with spouse.  Son is grown.  Retired from [employer].     patient rides bicycle 7 to 8  Mile a day in a 1/2hr.   Social Drivers of Corporate investment banker Strain: Not on file  Food Insecurity: Not on file  Transportation Needs: Not on file  Physical Activity: Not on file  Stress: Not on file  Social Connections: Unknown (07/04/2021)   Received from [facility]   Social Network    Social Network: Not on file   Family History  Problem Relation Age of Onset   Coronary artery disease Father    Heart attack Father    Atrial fibrillation Mother 42   Colon polyps Mother    Seizures Brother    Prostate cancer Maternal Uncle    Heart attack Maternal Grandfather    Heart attack Paternal Grandmother    Heart attack Paternal Grandfather    Colon cancer Neg Hx    Esophageal cancer Neg Hx    Rectal cancer Neg Hx    Stomach cancer Neg Hx    Allergies  Allergen Reactions   Cephalexin Other (See  Comments)    REACTION: tachycardia Rapid heart beat   Levofloxacin Other (See Comments)    REACTION: tachycardia Rapid heartbeat   Zoster Vac Recomb Adjuvanted Other (See Comments)    Shakes, drove him crazy    Clarithromycin Other (See Comments)    Mouth got white spots.   Penicillins Rash and Other (See Comments)    REACTION: rash Unsure   Ciprofloxacin Diarrhea   Doxycycline Other (See Comments)   Lipitor [Atorvastatin] Palpitations    Muscle aches on lipitor 80 mg daily   Prior to Admission medications   Medication Sig Start Date End Date Taking? Authorizing Provider  aspirin  EC 81 MG tablet Take 1 tablet (81 mg total) by mouth daily. 10/22/17  Yes Weaver, Scott T, PA-C  calcium  carbonate (TUMS - DOSED IN MG ELEMENTAL CALCIUM ) 500 MG chewable tablet Chew 1 tablet by mouth daily as needed.   Yes [provider]  Cholecalciferol (VITAMIN D) 2000 UNITS tablet Take 2,000 Units by mouth daily.   Yes [provider]   Coenzyme Q10 100 MG capsule Take 100 mg by mouth daily.   Yes [provider]  famotidine  (PEPCID ) 20 MG tablet Take 20 mg by mouth 2 (two) times daily.   Yes [provider]  Flaxseed, Linseed, POWD Take 2 scoop by mouth as directed.   Yes [provider]  Ganciclovir (ZIRGAN) 0.15 % GEL 1 application. As needed for cold sores   Yes [provider]  ibuprofen  (ADVIL ,MOTRIN ) 200 MG tablet Take 200 mg by mouth every 6 (six) hours as needed.   Yes [provider]  LORazepam (ATIVAN) 0.5 MG tablet Take 1 tablet by mouth at bedtime as needed for sleep. To help with sleep 07/02/11  Yes [provider]  metoprolol  succinate (TOPROL -XL) 25 MG 24 hr tablet Take 1 tablet (25 mg total) by mouth 2 (two) times daily. 06/12/23  Yes Verlin Lonni BIRCH, MD  Multiple Vitamin (MULTIVITAMIN WITH MINERALS) TABS Take 1 tablet by mouth daily. Centrum silver   Yes [provider]  rosuvastatin  (CRESTOR ) 20 MG tablet TAKE 1 TABLET BY MOUTH DAILY 06/12/23  Yes Verlin Lonni BIRCH, MD  hydrocortisone 2.5 % cream Apply 1 Application topically daily as needed (Apply topically as needed). 12/27/22   [provider]  nitroGLYCERIN  (NITROSTAT ) 0.4 MG SL tablet Place 1 tablet (0.4 mg total) under the tongue every 5 (five) minutes as needed for chest pain (3 DOSES MAX). 05/21/22   Dann Candyce RAMAN, MD  pramoxine-hydrocortisone [facility]) cream Apply 1 application topically at bedtime as needed (for skin).     [provider]  pramoxine-hydrocortisone (PROCTOCREAM-HC) 1-1 % rectal cream Apply topically. 05/29/19   [provider]     Positive ROS: All other systems have been reviewed and were otherwise negative with the exception of those mentioned in the HPI and as above.  Physical Exam: General: Alert, no acute distress Cardiovascular: No pedal edema Respiratory: No cyanosis, no use of accessory musculature GI: No  organomegaly, abdomen is soft and non-tender Skin: No lesions in the area of chief complaint Neurologic: Sensation intact distally Psychiatric: Patient is competent for consent with normal mood and affect Lymphatic: No axillary or cervical lymphadenopathy  MUSCULOSKELETAL: On exam, the left thumb has reproducible triggering. Minimal grind testing at the [facility] joint. Sensation intact throughout.  X-rays of  left thumb are normal.  Assessment: Left trigger thumb   Plan: Plan for Procedure(s): RELEASE, A1 PULLEY, FOR TRIGGER FINGER  The risks  benefits and alternatives were discussed with the patient including but not limited to the risks of nonoperative treatment, versus surgical intervention including infection, bleeding, nerve injury,  blood clots, cardiopulmonary complications, morbidity, mortality, among others, and they were willing to proceed.   Army MARLA Daring, PA-C    12/06/2023 1:48 PM

## 2023-12-09 NOTE — Anesthesia Preprocedure Evaluation (Signed)
 Anesthesia Evaluation  Patient identified by MRN, date of birth, ID band Patient awake    Reviewed: Allergy & Precautions, NPO status , Patient's Chart, lab work & pertinent test results  History of Anesthesia Complications Negative for: history of anesthetic complications  Airway Mallampati: II  TM Distance: >3 FB Neck ROM: Full    Dental  (+) Dental Advisory Given   Pulmonary neg shortness of breath, neg sleep apnea, neg COPD, neg recent URI, former smoker   Pulmonary exam normal breath sounds clear to auscultation       Cardiovascular hypertension (metoprolol ), Pt. on home beta blockers (-) angina + CAD and + CABG (07/13/2005)  (-) Past MI and (-) Cardiac Stents + dysrhythmias (PACs, PVCs, RBBB) Atrial Fibrillation and Supra Ventricular Tachycardia  Rhythm:Regular Rate:Normal  HLD  TTE 10/12/2022: IMPRESSIONS    1. Left ventricular ejection fraction, by estimation, is 60 to 65%. The  left ventricle has normal function. The left ventricle has no regional  wall motion abnormalities. Left ventricular diastolic parameters were  normal.   2. Right ventricular systolic function is normal. The right ventricular  size is normal.   3. The mitral valve is normal in structure. No evidence of mitral valve  regurgitation. No evidence of mitral stenosis.   4. The aortic valve is normal in structure. There is mild calcification  of the aortic valve. Aortic valve regurgitation is trivial. No aortic  stenosis is present.   5. The inferior vena cava is normal in size with greater than 50%  respiratory variability, suggesting right atrial pressure of 3 mmHg.     Neuro/Psych  PSYCHIATRIC DISORDERS Anxiety     negative neurological ROS     GI/Hepatic Neg liver ROS,GERD  Medicated,,  Endo/Other  negative endocrine ROS    Renal/GU negative Renal ROS     Musculoskeletal  (+) Arthritis ,    Abdominal   Peds  Hematology negative  hematology ROS (+) Lab Results      Component                Value               Date                      WBC                      10.2                06/20/2023                HGB                      14.3                06/20/2023                HCT                      43.3                06/20/2023                MCV                      90.8                06/20/2023  PLT                      209                 06/20/2023              Anesthesia Other Findings   Reproductive/Obstetrics                              Anesthesia Physical Anesthesia Plan  ASA: 3  Anesthesia Plan: MAC   Post-op Pain Management: Minimal or no pain anticipated   Induction: Intravenous  PONV Risk Score and Plan: 1 and Ondansetron, Dexamethasone, Treatment may vary due to age or medical condition, Propofol infusion and TIVA  Airway Management Planned: Natural Airway and Simple Face Mask  Additional Equipment:   Intra-op Plan:   Post-operative Plan:   Informed Consent: I have reviewed the patients History and Physical, chart, labs and discussed the procedure including the risks, benefits and alternatives for the proposed anesthesia with the patient or authorized representative who has indicated his/her understanding and acceptance.     Dental advisory given  Plan Discussed with: CRNA and Anesthesiologist  Anesthesia Plan Comments: (Discussed with patient risks of MAC including, but not limited to, minor pain or discomfort, hearing people in the room, and possible need for backup general anesthesia. Risks for general anesthesia also discussed including, but not limited to, sore throat, hoarse voice, chipped/damaged teeth, injury to vocal cords, nausea and vomiting, allergic reactions, lung infection, heart attack, stroke, and death. All questions answered. )         Anesthesia Quick Evaluation

## 2023-12-10 ENCOUNTER — Ambulatory Visit (HOSPITAL_BASED_OUTPATIENT_CLINIC_OR_DEPARTMENT_OTHER)
Admission: RE | Admit: 2023-12-10 | Discharge: 2023-12-10 | Disposition: A | Discharge status: Home / Self Care | Attending: Orthopedic Surgery | Admitting: Orthopedic Surgery

## 2023-12-10 ENCOUNTER — Encounter (HOSPITAL_BASED_OUTPATIENT_CLINIC_OR_DEPARTMENT_OTHER): Payer: Self-pay | Admitting: Orthopedic Surgery

## 2023-12-10 ENCOUNTER — Encounter (HOSPITAL_BASED_OUTPATIENT_CLINIC_OR_DEPARTMENT_OTHER)
Admission: RE | Disposition: A | Discharge status: Home / Self Care | Payer: Self-pay | Source: Home / Self Care | Attending: Orthopedic Surgery

## 2023-12-10 ENCOUNTER — Ambulatory Visit (HOSPITAL_BASED_OUTPATIENT_CLINIC_OR_DEPARTMENT_OTHER): Admitting: Anesthesiology

## 2023-12-10 ENCOUNTER — Other Ambulatory Visit: Payer: Self-pay

## 2023-12-10 ENCOUNTER — Encounter (HOSPITAL_BASED_OUTPATIENT_CLINIC_OR_DEPARTMENT_OTHER): Admitting: Anesthesiology

## 2023-12-10 DIAGNOSIS — Z01818 Encounter for other preprocedural examination: Secondary | ICD-10-CM

## 2023-12-10 DIAGNOSIS — I10 Essential (primary) hypertension: Secondary | ICD-10-CM | POA: Insufficient documentation

## 2023-12-10 DIAGNOSIS — E785 Hyperlipidemia, unspecified: Secondary | ICD-10-CM | POA: Diagnosis not present

## 2023-12-10 DIAGNOSIS — I2510 Atherosclerotic heart disease of native coronary artery without angina pectoris: Secondary | ICD-10-CM

## 2023-12-10 DIAGNOSIS — M65312 Trigger thumb, left thumb: Secondary | ICD-10-CM

## 2023-12-10 DIAGNOSIS — K219 Gastro-esophageal reflux disease without esophagitis: Secondary | ICD-10-CM | POA: Insufficient documentation

## 2023-12-10 DIAGNOSIS — Z87891 Personal history of nicotine dependence: Secondary | ICD-10-CM | POA: Insufficient documentation

## 2023-12-10 DIAGNOSIS — Z951 Presence of aortocoronary bypass graft: Secondary | ICD-10-CM | POA: Insufficient documentation

## 2023-12-10 HISTORY — PX: TRIGGER FINGER RELEASE: SHX641

## 2023-12-10 SURGERY — RELEASE, A1 PULLEY, FOR TRIGGER FINGER
Anesthesia: Monitor Anesthesia Care | Site: Thumb | Laterality: Left

## 2023-12-10 MED ORDER — PROPOFOL 500 MG/50ML IV EMUL
INTRAVENOUS | Status: DC | PRN
  Administered 2023-12-10: 100 ug/kg/min via INTRAVENOUS

## 2023-12-10 MED ORDER — PROPOFOL 500 MG/50ML IV EMUL
INTRAVENOUS | Status: AC
  Filled 2023-12-10: qty 50

## 2023-12-10 MED ORDER — CEFAZOLIN SODIUM-DEXTROSE 2-4 GM/100ML-% IV SOLN
2.0000 g | INTRAVENOUS | Status: AC
  Administered 2023-12-10: 2 g via INTRAVENOUS

## 2023-12-10 MED ORDER — BUPIVACAINE HCL (PF) 0.5 % IJ SOLN
INTRAMUSCULAR | Status: AC
Start: 2023-12-10 — End: 2023-12-10
  Filled 2023-12-10: qty 90

## 2023-12-10 MED ORDER — CEFAZOLIN SODIUM-DEXTROSE 2-4 GM/100ML-% IV SOLN
INTRAVENOUS | Status: AC
  Filled 2023-12-10: qty 100

## 2023-12-10 MED ORDER — ACETAMINOPHEN 500 MG PO TABS
ORAL_TABLET | ORAL | Status: AC
Start: 2023-12-10 — End: 2023-12-10
  Filled 2023-12-10: qty 2

## 2023-12-10 MED ORDER — LACTATED RINGERS IV SOLN: INTRAVENOUS | Status: DC

## 2023-12-10 MED ORDER — LIDOCAINE HCL (CARDIAC) PF 100 MG/5ML IV SOSY
PREFILLED_SYRINGE | INTRAVENOUS | Status: DC | PRN
  Administered 2023-12-10: 40 mg via INTRAVENOUS

## 2023-12-10 MED ORDER — PROPOFOL 10 MG/ML IV BOLUS
INTRAVENOUS | Status: DC | PRN
  Administered 2023-12-10: 50 mg via INTRAVENOUS

## 2023-12-10 MED ORDER — BUPIVACAINE-EPINEPHRINE (PF) 0.5% -1:200000 IJ SOLN
INTRAMUSCULAR | Status: AC
Start: 2023-12-10 — End: 2023-12-10
  Filled 2023-12-10: qty 30

## 2023-12-10 MED ORDER — TRAMADOL HCL 50 MG PO TABS: 50.0000 mg | ORAL_TABLET | Freq: Four times a day (QID) | ORAL | 0 refills | Status: AC | PRN

## 2023-12-10 MED ORDER — BUPIVACAINE HCL (PF) 0.5 % IJ SOLN
INTRAMUSCULAR | Status: DC | PRN
  Administered 2023-12-10: 10 mL

## 2023-12-10 MED ORDER — PROPOFOL 10 MG/ML IV BOLUS
INTRAVENOUS | Status: AC
  Filled 2023-12-10: qty 20

## 2023-12-10 MED ORDER — VANCOMYCIN HCL 500 MG IV SOLR
INTRAVENOUS | Status: AC
Start: 2023-12-10 — End: 2023-12-10
  Filled 2023-12-10: qty 10

## 2023-12-10 MED ORDER — MIDAZOLAM HCL 2 MG/2ML IJ SOLN
INTRAMUSCULAR | Status: AC
  Filled 2023-12-10: qty 2

## 2023-12-10 MED ORDER — FENTANYL CITRATE (PF) 100 MCG/2ML IJ SOLN: 25.0000 ug | INTRAMUSCULAR | Status: DC | PRN

## 2023-12-10 MED ORDER — OXYCODONE HCL 5 MG PO TABS: 5.0000 mg | ORAL_TABLET | Freq: Once | ORAL | Status: DC | PRN

## 2023-12-10 MED ORDER — MIDAZOLAM HCL 5 MG/5ML IJ SOLN
INTRAMUSCULAR | Status: DC | PRN
  Administered 2023-12-10: 2 mg via INTRAVENOUS

## 2023-12-10 MED ORDER — ONDANSETRON HCL 4 MG/2ML IJ SOLN
INTRAMUSCULAR | Status: AC
  Filled 2023-12-10: qty 2

## 2023-12-10 MED ORDER — AMISULPRIDE (ANTIEMETIC) 5 MG/2ML IV SOLN: 10.0000 mg | Freq: Once | INTRAVENOUS | Status: DC | PRN

## 2023-12-10 MED ORDER — OXYCODONE HCL 5 MG/5ML PO SOLN: 5.0000 mg | Freq: Once | ORAL | Status: DC | PRN

## 2023-12-10 MED ORDER — BUPIVACAINE HCL (PF) 0.25 % IJ SOLN
INTRAMUSCULAR | Status: AC
  Filled 2023-12-10: qty 90

## 2023-12-10 MED ORDER — LIDOCAINE 2% (20 MG/ML) 5 ML SYRINGE
INTRAMUSCULAR | Status: AC
Start: 2023-12-10 — End: 2023-12-10
  Filled 2023-12-10: qty 5

## 2023-12-10 MED ORDER — ACETAMINOPHEN 500 MG PO TABS
1000.0000 mg | ORAL_TABLET | Freq: Once | ORAL | Status: AC
  Administered 2023-12-10: 1000 mg via ORAL

## 2023-12-10 SURGICAL SUPPLY — 31 items
BLADE SURG 15 STRL LF DISP TIS (BLADE) ×1 IMPLANT
BNDG COHESIVE 4X5 TAN STRL LF (GAUZE/BANDAGES/DRESSINGS) IMPLANT
BNDG COMPR ESMARK 4X3 LF (GAUZE/BANDAGES/DRESSINGS) IMPLANT
BNDG ELASTIC 3INX 5YD STR LF (GAUZE/BANDAGES/DRESSINGS) ×1 IMPLANT
CORD BIPOLAR FORCEPS 12FT (ELECTRODE) ×1 IMPLANT
COVER BACK TABLE 60X90IN (DRAPES) ×1 IMPLANT
CUFF TOURN SGL QUICK 18X4 (TOURNIQUET CUFF) IMPLANT
DRAPE EXTREMITY T 121X128X90 (DISPOSABLE) ×1 IMPLANT
DRAPE IMP U-DRAPE 54X76 (DRAPES) ×1 IMPLANT
DRAPE SURG 17X23 STRL (DRAPES) ×1 IMPLANT
DURAPREP 26ML APPLICATOR (WOUND CARE) ×1 IMPLANT
GAUZE SPONGE 4X4 12PLY STRL (GAUZE/BANDAGES/DRESSINGS) ×1 IMPLANT
GAUZE XEROFORM 1X8 LF (GAUZE/BANDAGES/DRESSINGS) ×1 IMPLANT
GLOVE BIO SURGEON STRL SZ7 (GLOVE) ×1 IMPLANT
GLOVE BIOGEL PI IND STRL 7.0 (GLOVE) ×1 IMPLANT
GLOVE BIOGEL PI IND STRL 8 (GLOVE) ×2 IMPLANT
GLOVE ORTHO TXT STRL SZ7.5 (GLOVE) ×1 IMPLANT
GOWN STRL REUS W/ TWL LRG LVL3 (GOWN DISPOSABLE) ×1 IMPLANT
GOWN STRL REUS W/ TWL XL LVL3 (GOWN DISPOSABLE) ×2 IMPLANT
NDL HYPO 25X1 1.5 SAFETY (NEEDLE) IMPLANT
NEEDLE HYPO 25X1 1.5 SAFETY (NEEDLE) ×1 IMPLANT
NS IRRIG 1000ML POUR BTL (IV SOLUTION) ×1 IMPLANT
PACK BASIN DAY SURGERY FS (CUSTOM PROCEDURE TRAY) ×1 IMPLANT
PAD CAST 3X4 CTTN HI CHSV (CAST SUPPLIES) ×1 IMPLANT
PADDING CAST ABS COTTON 3X4 (CAST SUPPLIES) ×1 IMPLANT
SPLINT PLASTER CAST XFAST 3X15 (CAST SUPPLIES) IMPLANT
SUT ETHILON 4 0 PS 2 18 (SUTURE) ×1 IMPLANT
SYR BULB EAR ULCER 3OZ GRN STR (SYRINGE) ×1 IMPLANT
SYR CONTROL 10ML LL (SYRINGE) IMPLANT
TOWEL GREEN STERILE FF (TOWEL DISPOSABLE) ×1 IMPLANT
UNDERPAD 30X36 HEAVY ABSORB (UNDERPADS AND DIAPERS) ×1 IMPLANT

## 2023-12-10 NOTE — Anesthesia Postprocedure Evaluation (Signed)
 Anesthesia Post Note  Patient: Noah [name]  Procedure(s) Performed: LEFT THUMB A1 PULLEY RELEASE, FOR TRIGGER FINGER (Left: Thumb)     Patient location during evaluation: PACU Anesthesia Type: MAC Level of consciousness: awake Pain management: pain level controlled Vital Signs Assessment: post-procedure vital signs reviewed and stable Respiratory status: spontaneous breathing, nonlabored ventilation and respiratory function stable Cardiovascular status: stable and blood pressure returned to baseline Postop Assessment: no apparent nausea or vomiting Anesthetic complications: no   No notable events documented.  Last Vitals:  Vitals:   12/10/23 0815 12/10/23 0830  BP: (!) 94/55 (!) 101/59  Pulse: (!) 55 (!) 54  Resp: 17 15  Temp:    SpO2: 100% 94%    Last Pain:  Vitals:   12/10/23 0830  PainSc: 0-No pain                 Delon Aisha Arch

## 2023-12-10 NOTE — Transfer of Care (Signed)
 Immediate Anesthesia Transfer of Care Note  Patient: Noah [name]  Procedure(s) Performed: LEFT THUMB A1 PULLEY RELEASE, FOR TRIGGER FINGER (Left: Thumb)  Patient Location: PACU  Anesthesia Type:MAC  Level of Consciousness: sedated  Airway & Oxygen Therapy: Patient Spontanous Breathing and Patient connected to face mask oxygen  Post-op Assessment: Report given to RN and Post -op Vital signs reviewed and stable  Post vital signs: Reviewed and stable  Last Vitals:  Vitals Value Taken Time  BP 94/56 12/10/23 08:11  Temp 36.6 C 12/10/23 08:11  Pulse 55 12/10/23 08:14  Resp 17 12/10/23 08:14  SpO2 100 % 12/10/23 08:14  Vitals shown include unfiled device data.  Last Pain:  Vitals:   12/10/23 0811  PainSc: Asleep         Complications: No notable events documented.

## 2023-12-10 NOTE — Interval H&P Note (Signed)
 History and Physical Interval Note:  12/10/2023 7:20 AM  Noah [name]  has presented today for surgery, with the diagnosis of Trigger thumb of left hand.  The various methods of treatment have been discussed with the patient and family. After consideration of risks, benefits and other options for treatment, the patient has consented to  Procedure(s): RELEASE, A1 PULLEY, FOR TRIGGER FINGER (Left) as a surgical intervention.  The patient's history has been reviewed, patient examined, no change in status, stable for surgery.  I have reviewed the patient's chart and labs.  Questions were answered to the patient's satisfaction.     Fonda SHAUNNA Olmsted

## 2023-12-10 NOTE — Op Note (Signed)
 12/10/2023  8:33 AM  PATIENT:  Noah [name]    PRE-OPERATIVE DIAGNOSIS:  Trigger thumb of left hand  POST-OPERATIVE DIAGNOSIS:  Same  PROCEDURE:  LEFT THUMB A1 PULLEY RELEASE, FOR TRIGGER FINGER  SURGEON:  Fonda SHAUNNA Olmsted, MD  PHYSICIAN ASSISTANT: Army Daring, PA-C, present and scrubbed throughout the case, critical for completion in a timely fashion, and for retraction, instrumentation, and closure.  ANESTHESIA:   General  PREOPERATIVE INDICATIONS:  Noah [name] is a  73 y.o. male with a diagnosis of Trigger thumb of left hand who failed conservative measures and elected for surgical management.    The risks benefits and alternatives were discussed with the patient preoperatively including but not limited to the risks of infection, bleeding, nerve injury, cardiopulmonary complications, the need for revision surgery, among others, and the patient was willing to proceed.  ESTIMATED BLOOD LOSS: minimal  OPERATIVE IMPLANTS:   * No implants in log *  OPERATIVE FINDINGS: Thickening at the A1 pulley  OPERATIVE PROCEDURE: The patient was brought to the operating room and placed in supine position.  General anesthesia was administered.  The left upper extremity was prepped and draped in usual sterile fashion.  Timeout performed.  I did perform a local block with half percent Marcaine before we did the sterile prep and drape, and did a timeout separately for that.  The arm was elevated and exsanguinated and the tourniquet was inflated.  Incision was made over the A1 pulley and dissection carried down in the digital nerve to the thumb was identified and protected and retracted.  The A1 pulley was released completely and I had a smooth arc of motion that eliminated the triggering.  The wounds were irrigated copiously, the tourniquet dropped, the skin closed with nylon, sterile dressings applied, and the patient was awakened and returned to the PACU in stable and satisfactory  condition.  There were no complications and he tolerated the procedure well.

## 2023-12-10 NOTE — Discharge Instructions (Addendum)
 Diet: As you were doing prior to hospitalization   Shower:  May shower but keep the wounds dry, use an occlusive plastic wrap, NO SOAKING IN TUB.  If the bandage gets wet, change with a clean dry gauze.  If you have a splint on, leave the splint in place and keep the splint dry with a plastic bag.  Dressing:  You may change your dressing 3-5 days after surgery, unless you have a splint.  If you have a splint, then just leave the splint in place and we will change your bandages during your first follow-up appointment.    If you had hand or foot surgery, we will plan to remove your stitches in about 2 weeks in the office.  For all other surgeries, there are sticky tapes (steri-strips) on your wounds and all the stitches are absorbable.  Leave the steri-strips in place when changing your dressings, they will peel off with time, usually 2-3 weeks.  Activity:  Increase activity slowly as tolerated, but follow the weight bearing instructions below.  The rules on driving is that you can not be taking narcotics while you drive, and you must feel in control of the vehicle.    Weight Bearing:  non weight bearing with left arm until follow up visit    To prevent constipation: you may use a stool softener such as -  Colace (over the counter) 100 mg by mouth twice a day  Drink plenty of fluids (prune juice may be helpful) and high fiber foods Miralax (over the counter) for constipation as needed.    Itching:  If you experience itching with your medications, try taking only a single pain pill, or even half a pain pill at a time.  You may take up to 10 pain pills per day, and you can also use benadryl over the counter for itching or also to help with sleep.   Precautions:  If you experience chest pain or shortness of breath - call 911 immediately for transfer to the hospital emergency department!!  If you develop a fever greater that 101 F, purulent drainage from wound, increased redness or drainage from  wound, or calf pain -- Call the office at [phone]                                                Follow- Up Appointment:  Please call for an appointment to be seen in 2 weeks [place] - [phone]     Post Anesthesia Home Care Instructions  Activity: Get plenty of rest for the remainder of the day. A responsible individual must stay with you for 24 hours following the procedure.  For the next 24 hours, DO NOT: -Drive a car -Advertising copywriter -Drink alcoholic beverages -Take any medication unless instructed by your physician -Make any legal decisions or sign important papers.  Meals: Start with liquid foods such as gelatin or soup. Progress to regular foods as tolerated. Avoid greasy, spicy, heavy foods. If nausea and/or vomiting occur, drink only clear liquids until the nausea and/or vomiting subsides. Call your physician if vomiting continues.  Special Instructions/Symptoms: Your throat may feel dry or sore from the anesthesia or the breathing tube placed in your throat during surgery. If this causes discomfort, gargle with warm salt water. The discomfort should disappear within 24 hours.  If you had a scopolamine patch placed  behind your ear for the management of post- operative nausea and/or vomiting:  1. The medication in the patch is effective for 72 hours, after which it should be removed.  Wrap patch in a tissue and discard in the trash. Wash hands thoroughly with soap and water. 2. You may remove the patch earlier than 72 hours if you experience unpleasant side effects which may include dry mouth, dizziness or visual disturbances. 3. Avoid touching the patch. Wash your hands with soap and water after contact with the patch.    No Tylenol until 12:54pm today if needed

## 2023-12-11 ENCOUNTER — Encounter (HOSPITAL_BASED_OUTPATIENT_CLINIC_OR_DEPARTMENT_OTHER): Payer: Self-pay | Admitting: Orthopedic Surgery

## 2023-12-18 ENCOUNTER — Encounter: Payer: Self-pay | Admitting: Cardiovascular Disease

## 2023-12-19 ENCOUNTER — Other Ambulatory Visit: Payer: Self-pay

## 2023-12-20 MED ORDER — METOPROLOL SUCCINATE ER 25 MG PO TB24: 25.0000 mg | ORAL_TABLET | Freq: Two times a day (BID) | ORAL | 3 refills | Status: DC

## 2023-12-23 ENCOUNTER — Ambulatory Visit: Attending: Physician Assistant | Admitting: Physician Assistant

## 2023-12-23 ENCOUNTER — Encounter: Payer: Self-pay | Admitting: Physician Assistant

## 2023-12-23 VITALS — BP 140/70 | HR 74 | Ht 68.0 in | Wt 167.0 lb

## 2023-12-23 DIAGNOSIS — E782 Mixed hyperlipidemia: Secondary | ICD-10-CM | POA: Diagnosis not present

## 2023-12-23 DIAGNOSIS — I10 Essential (primary) hypertension: Secondary | ICD-10-CM

## 2023-12-23 DIAGNOSIS — I2510 Atherosclerotic heart disease of native coronary artery without angina pectoris: Secondary | ICD-10-CM | POA: Diagnosis not present

## 2023-12-23 MED ORDER — AMLODIPINE BESYLATE 2.5 MG PO TABS: 2.5000 mg | ORAL_TABLET | Freq: Every day | ORAL | 3 refills | Status: AC

## 2023-12-23 NOTE — Patient Instructions (Signed)
 Medication Instructions:  START taking Amlodipine (Norvasc) 2.5 mg, take one (1) tablet by mouth once daily.   *If you need a refill on your cardiac medications before your next appointment, please call your pharmacy*  Lab Work: None ordered If you have labs (blood work) drawn today and your tests are completely normal, you will receive your results only by: MyChart Message (if you have MyChart) OR A paper copy in the mail If you have any lab test that is abnormal or we need to change your treatment, we will call you to review the results.  Testing/Procedures: None Ordered  Follow-Up: At [facility], you and your health needs are our priority.  As part of our continuing mission to provide you with exceptional heart care, our providers are all part of one team.  This team includes your primary Cardiologist (physician) and Advanced Practice Providers or APPs (Physician Assistants and Nurse Practitioners) who all work together to provide you with the care you need, when you need it.  Your next appointment:   6 month(s)  Provider:   Lonni Cash, MD or Glendia Ferrier PA-C   We recommend signing up for the patient portal called MyChart.  Sign up information is provided on this After Visit Summary.  MyChart is used to connect with patients for Virtual Visits (Telemedicine).  Patients are able to view lab/test results, encounter notes, upcoming appointments, etc.  Non-urgent messages can be sent to your provider as well.   To learn more about what you can do with MyChart, go to [email or web address].   Other Instructions  Continue monitoring your BP, record the results, and send them via MyChart in a few weeks.

## 2023-12-23 NOTE — Assessment & Plan Note (Addendum)
 S/p CABG in 2007.  Nuclear stress test 2022 low risk.  He is doing well without chest symptoms to suggest angina. - Continue aspirin  81 mg daily. - Continue rosuvastatin  20 mg daily.

## 2023-12-23 NOTE — Assessment & Plan Note (Signed)
 Recent LDL levels are optimal. Current management with rosuvastatin  is effective. - Continue rosuvastatin  20 mg daily.

## 2023-12-23 NOTE — Progress Notes (Signed)
 OFFICE NOTE:    Date:  12/23/2023  ID:  Noah [name], DOB [birth year], MRN [id number] PCP: Okey Carlin Redbird, MD  [facility] Providers Cardiologist:  Lonni Cash, MD        Coronary artery disease S/p CABG 2007 SPECT MPI 04/27/2020: No ischemia or infarction, EF 63, low risk TTE 10/12/2022: EF 60-65, no RWMA, normal RVSF, trivial AI Remote paroxysmal atrial fibrillation Not on anticoagulation Hypertension Hyperlipidemia LP(a) 10 (2017) Carotid artery disease Carotid US  05/07/2022: Right 1-39 Aortic atherosclerosis Right bundle branch block PVCs SVT Monitor 04/2023: Sinus rhythm, average heart rate 65, 13 SVT runs (longest 14 beats), PVCs <1%      Discussed the use of AI scribe software for clinical note transcription with the patient, who gave verbal consent to proceed. History of Present Illness Noah [name] is a 73 y.o. male returns for follow-up on high blood pressure he was last seen by Dr. Cash 11/18/2023.  He contacted our office recently with elevated blood pressures.  His metoprolol  succinate was increased to 37.5 mg daily. His BPs have remained elevated. He returns for further evaluation.   Over the past three to four months, he has observed an increase in his blood pressure, with readings rising from a usual 110/70 mmHg to 140s/130s mmHg, and recently as high as 157/89 mmHg. He is concerned as his blood pressure no longer normalizes after exercise. He reports no chest pain, shortness of breath, dizziness, or syncope. He maintains a regular exercise routine, cycling 10-12 miles daily and performing McKenzie exercises for his back and neck. His weight is stable, and he follows a Mediterranean diet, avoiding fried foods and red meat, and consuming fish frequently. He limits salt intake and does not add salt to his food.  He limits NSAID use.    ROS-See HPI    Studies Reviewed:       Results LABS Total cholesterol: 128  (11/12/2023) HDL: 53 (11/12/2023) LDL: 56 (11/12/2023) Triglycerides: 100 (11/12/2023) Creatinine: 1.06 (11/12/2023) Potassium: 4.2 (11/12/2023) ALT: 27 (11/12/2023)   HYPERTENSION CONTROL Vitals:   12/23/23 1353 12/23/23 1447  BP: (!) 158/78 (!) 140/70    The patient's blood pressure is elevated above target today.  In order to address the patient's elevated BP: A new medication was prescribed today.         Physical Exam:  VS:  BP (!) 140/70   Pulse 74   Ht 5' 8 (1.727 m)   Wt 167 lb (75.8 kg)   SpO2 98%   BMI 25.39 kg/m        Wt Readings from Last 3 Encounters:  12/23/23 167 lb (75.8 kg)  12/10/23 167 lb 8.8 oz (76 kg)  11/18/23 166 lb 9.6 oz (75.6 kg)    Constitutional:      Appearance: Healthy appearance. Not in distress.  Neck:     Vascular: No carotid bruit. JVD normal.  Pulmonary:     Breath sounds: Normal breath sounds. No wheezing. No rales.  Cardiovascular:     Normal rate. Regular rhythm.     Murmurs: There is no murmur.  Edema:    Peripheral edema absent.  Abdominal:     Palpations: Abdomen is soft.       Assessment and Plan:    Assessment & Plan Essential hypertension, benign Blood pressure has been optimal in the past.  Blood pressures are recently increased and running above target.  At this point, there is no indication to suggest renal artery  stenosis is contributing to his uncontrolled blood pressure.  I suspect he just has isolated systolic hypertension. - Start amlodipine 2.5 mg daily. - Continue metoprolol  succinate 25 mg twice daily. - Monitor blood pressure at home and report readings via MyChart in two weeks. - Will follow up in six months. Coronary artery disease involving native coronary artery of native heart without angina pectoris S/p CABG in 2007.  Nuclear stress test 2022 low risk.  He is doing well without chest symptoms to suggest angina. - Continue aspirin  81 mg daily. - Continue rosuvastatin  20 mg daily. Mixed  hyperlipidemia Recent LDL levels are optimal. Current management with rosuvastatin  is effective. - Continue rosuvastatin  20 mg daily.         Dispo:  Return in about 6 months (around 06/21/2024) for Routine Follow Up, w/ Dr. Verlin.  Signed, Glendia Ferrier, PA-C

## 2023-12-23 NOTE — Assessment & Plan Note (Addendum)
 Blood pressure has been optimal in the past.  Blood pressures are recently increased and running above target.  At this point, there is no indication to suggest renal artery stenosis is contributing to his uncontrolled blood pressure.  I suspect he just has isolated systolic hypertension. - Start amlodipine 2.5 mg daily. - Continue metoprolol  succinate 25 mg twice daily. - Monitor blood pressure at home and report readings via MyChart in two weeks. - Will follow up in six months.

## 2024-01-08 ENCOUNTER — Ambulatory Visit: Admitting: Cardiology

## 2024-01-09 ENCOUNTER — Ambulatory Visit: Admitting: Cardiology

## 2024-01-31 ENCOUNTER — Other Ambulatory Visit: Payer: Self-pay

## 2024-01-31 ENCOUNTER — Other Ambulatory Visit (HOSPITAL_COMMUNITY): Payer: Self-pay

## 2024-01-31 ENCOUNTER — Telehealth (HOSPITAL_COMMUNITY): Payer: Self-pay

## 2024-01-31 ENCOUNTER — Emergency Department (HOSPITAL_COMMUNITY)

## 2024-01-31 ENCOUNTER — Encounter: Payer: Self-pay | Admitting: Cardiovascular Disease

## 2024-01-31 ENCOUNTER — Encounter (HOSPITAL_COMMUNITY): Payer: Self-pay | Admitting: Emergency Medicine

## 2024-01-31 ENCOUNTER — Inpatient Hospital Stay (HOSPITAL_COMMUNITY)

## 2024-01-31 ENCOUNTER — Inpatient Hospital Stay (HOSPITAL_COMMUNITY)
Admission: EM | Disposition: A | Discharge status: Home / Self Care | Payer: Self-pay | Source: Home / Self Care | Attending: Internal Medicine

## 2024-01-31 ENCOUNTER — Inpatient Hospital Stay (HOSPITAL_COMMUNITY)
Admission: EM | Admit: 2024-01-31 | Discharge: 2024-01-31 | DRG: 322 | Disposition: A | Discharge status: Home / Self Care | Attending: Internal Medicine | Admitting: Internal Medicine

## 2024-01-31 DIAGNOSIS — F419 Anxiety disorder, unspecified: Secondary | ICD-10-CM | POA: Diagnosis present

## 2024-01-31 DIAGNOSIS — I214 Non-ST elevation (NSTEMI) myocardial infarction: Principal | ICD-10-CM | POA: Diagnosis present

## 2024-01-31 DIAGNOSIS — I10 Essential (primary) hypertension: Secondary | ICD-10-CM | POA: Diagnosis present

## 2024-01-31 DIAGNOSIS — E782 Mixed hyperlipidemia: Secondary | ICD-10-CM | POA: Diagnosis present

## 2024-01-31 DIAGNOSIS — Z87891 Personal history of nicotine dependence: Secondary | ICD-10-CM

## 2024-01-31 DIAGNOSIS — I2510 Atherosclerotic heart disease of native coronary artery without angina pectoris: Secondary | ICD-10-CM | POA: Diagnosis present

## 2024-01-31 DIAGNOSIS — I25810 Atherosclerosis of coronary artery bypass graft(s) without angina pectoris: Secondary | ICD-10-CM | POA: Diagnosis not present

## 2024-01-31 DIAGNOSIS — I480 Paroxysmal atrial fibrillation: Secondary | ICD-10-CM | POA: Diagnosis present

## 2024-01-31 DIAGNOSIS — Z7982 Long term (current) use of aspirin: Secondary | ICD-10-CM | POA: Diagnosis not present

## 2024-01-31 DIAGNOSIS — K219 Gastro-esophageal reflux disease without esophagitis: Secondary | ICD-10-CM | POA: Diagnosis present

## 2024-01-31 DIAGNOSIS — Z8249 Family history of ischemic heart disease and other diseases of the circulatory system: Secondary | ICD-10-CM

## 2024-01-31 DIAGNOSIS — Z79899 Other long term (current) drug therapy: Secondary | ICD-10-CM

## 2024-01-31 DIAGNOSIS — Z880 Allergy status to penicillin: Secondary | ICD-10-CM

## 2024-01-31 DIAGNOSIS — Z888 Allergy status to other drugs, medicaments and biological substances status: Secondary | ICD-10-CM

## 2024-01-31 DIAGNOSIS — I471 Supraventricular tachycardia, unspecified: Secondary | ICD-10-CM | POA: Diagnosis present

## 2024-01-31 DIAGNOSIS — I452 Bifascicular block: Secondary | ICD-10-CM | POA: Diagnosis present

## 2024-01-31 DIAGNOSIS — Z955 Presence of coronary angioplasty implant and graft: Secondary | ICD-10-CM

## 2024-01-31 HISTORY — PX: LEFT HEART CATH AND CORS/GRAFTS ANGIOGRAPHY: CATH118250

## 2024-01-31 HISTORY — PX: CORONARY STENT INTERVENTION: CATH118234

## 2024-01-31 LAB — BASIC METABOLIC PANEL WITH GFR
Anion gap: 10 (ref 5–15)
Anion gap: 9 (ref 5–15)
BUN: 20 mg/dL (ref 8–23)
BUN: 18 mg/dL (ref 8–23)
CO2: 27 mmol/L (ref 22–32)
CO2: 21 mmol/L — ABNORMAL LOW (ref 22–32)
Calcium: 8.8 mg/dL — ABNORMAL LOW (ref 8.9–10.3)
Calcium: 8.5 mg/dL — ABNORMAL LOW (ref 8.9–10.3)
Chloride: 101 mmol/L (ref 98–111)
Chloride: 107 mmol/L (ref 98–111)
Creatinine, Ser: 1 mg/dL (ref 0.61–1.24)
Creatinine, Ser: 0.91 mg/dL (ref 0.61–1.24)
GFR, Estimated: >60 mL/min (ref >60)
GFR, Estimated: >60 mL/min (ref >60)
Glucose, Bld: 126 mg/dL — ABNORMAL HIGH (ref 70–99)
Glucose, Bld: 107 mg/dL — ABNORMAL HIGH (ref 70–99)
Potassium: 4.1 mmol/L (ref 3.5–5.1)
Potassium: 4.3 mmol/L (ref 3.5–5.1)
Sodium: 138 mmol/L (ref 135–145)
Sodium: 137 mmol/L (ref 135–145)

## 2024-01-31 LAB — CBC
HCT: 44.4 % (ref 39.0–52.0)
HCT: 43.4 % (ref 39.0–52.0)
Hemoglobin: 14.8 g/dL (ref 13.0–17.0)
Hemoglobin: 14.7 g/dL (ref 13.0–17.0)
MCH: 30.6 pg (ref 26.0–34.0)
MCH: 30.6 pg (ref 26.0–34.0)
MCHC: 33.3 g/dL (ref 30.0–36.0)
MCHC: 33.9 g/dL (ref 30.0–36.0)
MCV: 91.9 fL (ref 80.0–100.0)
MCV: 90.4 fL (ref 80.0–100.0)
Platelets: 218 K/uL (ref 150–400)
Platelets: 212 K/uL (ref 150–400)
RBC: 4.83 MIL/uL (ref 4.22–5.81)
RBC: 4.8 MIL/uL (ref 4.22–5.81)
RDW: 14.3 % (ref 11.5–15.5)
RDW: 14.1 % (ref 11.5–15.5)
WBC: 9.9 K/uL (ref 4.0–10.5)
WBC: 9.3 K/uL (ref 4.0–10.5)
nRBC: 0.2 % (ref 0.0–0.2)
nRBC: 0 % (ref 0.0–0.2)

## 2024-01-31 LAB — ECHOCARDIOGRAM COMPLETE
Area-P 1/2: 3.72 cm2
Calc EF: 76.6 %
S' Lateral: 2.9 cm
Single Plane A2C EF: 77.6 %
Single Plane A4C EF: 76.1 %

## 2024-01-31 LAB — HEPARIN LEVEL (UNFRACTIONATED): Heparin Unfractionated: 0.3 [IU]/mL (ref 0.30–0.70)

## 2024-01-31 LAB — TROPONIN I (HIGH SENSITIVITY)
Troponin I (High Sensitivity): 484 ng/L (ref <18)
Troponin I (High Sensitivity): 2458 ng/L (ref <18)

## 2024-01-31 LAB — MAGNESIUM: Magnesium: 2.4 mg/dL (ref 1.7–2.4)

## 2024-01-31 SURGERY — LEFT HEART CATH AND CORS/GRAFTS ANGIOGRAPHY
Anesthesia: LOCAL

## 2024-01-31 MED ORDER — COENZYME Q10 100 MG PO CAPS: 100.0000 mg | ORAL_CAPSULE | Freq: Every day | ORAL | Status: DC

## 2024-01-31 MED ORDER — AMLODIPINE BESYLATE 5 MG PO TABS
2.5000 mg | ORAL_TABLET | Freq: Every day | ORAL | Status: DC
  Administered 2024-01-31: 2.5 mg via ORAL
  Filled 2024-01-31: qty 1

## 2024-01-31 MED ORDER — TICAGRELOR 90 MG PO TABS: 90.0000 mg | ORAL_TABLET | Freq: Two times a day (BID) | ORAL | Status: DC

## 2024-01-31 MED ORDER — HYDRALAZINE HCL 20 MG/ML IJ SOLN: 10.0000 mg | INTRAMUSCULAR | Status: DC | PRN

## 2024-01-31 MED ORDER — TICAGRELOR 90 MG PO TABS
ORAL_TABLET | ORAL | Status: DC | PRN
  Administered 2024-01-31: 180 mg via ORAL

## 2024-01-31 MED ORDER — SODIUM CHLORIDE 0.9 % IV SOLN: 250.0000 mL | INTRAVENOUS | Status: DC | PRN

## 2024-01-31 MED ORDER — SODIUM CHLORIDE 0.9% FLUSH: 3.0000 mL | INTRAVENOUS | Status: DC | PRN

## 2024-01-31 MED ORDER — NITROGLYCERIN 1 MG/10 ML FOR IR/CATH LAB
INTRA_ARTERIAL | Status: AC
  Filled 2024-01-31: qty 10

## 2024-01-31 MED ORDER — HEPARIN BOLUS VIA INFUSION
4000.0000 [IU] | Freq: Once | INTRAVENOUS | Status: AC
  Administered 2024-01-31: 4000 [IU] via INTRAVENOUS
  Filled 2024-01-31: qty 4000

## 2024-01-31 MED ORDER — FENTANYL CITRATE (PF) 100 MCG/2ML IJ SOLN
INTRAMUSCULAR | Status: DC | PRN
  Administered 2024-01-31: 25 ug via INTRAVENOUS

## 2024-01-31 MED ORDER — FENTANYL CITRATE (PF) 100 MCG/2ML IJ SOLN
INTRAMUSCULAR | Status: AC
  Filled 2024-01-31: qty 2

## 2024-01-31 MED ORDER — MIDAZOLAM HCL 2 MG/2ML IJ SOLN
INTRAMUSCULAR | Status: AC
  Filled 2024-01-31: qty 2

## 2024-01-31 MED ORDER — SODIUM CHLORIDE 0.9% FLUSH: 3.0000 mL | Freq: Two times a day (BID) | INTRAVENOUS | Status: DC

## 2024-01-31 MED ORDER — ALPRAZOLAM 0.25 MG PO TABS: 0.5000 mg | ORAL_TABLET | Freq: Two times a day (BID) | ORAL | Status: DC | PRN

## 2024-01-31 MED ORDER — ONDANSETRON HCL 4 MG/2ML IJ SOLN: 4.0000 mg | Freq: Four times a day (QID) | INTRAMUSCULAR | Status: DC | PRN

## 2024-01-31 MED ORDER — HEPARIN (PORCINE) 25000 UT/250ML-% IV SOLN
1000.0000 [IU]/h | INTRAVENOUS | Status: DC
  Administered 2024-01-31: 1000 [IU]/h via INTRAVENOUS
  Filled 2024-01-31: qty 250

## 2024-01-31 MED ORDER — NITROGLYCERIN 0.4 MG SL SUBL: 0.4000 mg | SUBLINGUAL_TABLET | SUBLINGUAL | Status: DC | PRN

## 2024-01-31 MED ORDER — TRAMADOL HCL 50 MG PO TABS: 50.0000 mg | ORAL_TABLET | Freq: Four times a day (QID) | ORAL | Status: DC | PRN

## 2024-01-31 MED ORDER — LORAZEPAM 1 MG PO TABS
1.0000 mg | ORAL_TABLET | Freq: Once | ORAL | Status: AC
  Administered 2024-01-31: 1 mg via ORAL
  Filled 2024-01-31: qty 1

## 2024-01-31 MED ORDER — ASPIRIN 81 MG PO CHEW
81.0000 mg | CHEWABLE_TABLET | ORAL | Status: AC
  Administered 2024-01-31: 81 mg via ORAL
  Filled 2024-01-31: qty 1

## 2024-01-31 MED ORDER — HEPARIN SODIUM (PORCINE) 1000 UNIT/ML IJ SOLN
INTRAMUSCULAR | Status: DC | PRN
  Administered 2024-01-31: 2000 [IU] via INTRAVENOUS
  Administered 2024-01-31 (×2): 4000 [IU] via INTRAVENOUS

## 2024-01-31 MED ORDER — LABETALOL HCL 5 MG/ML IV SOLN: 10.0000 mg | INTRAVENOUS | Status: DC | PRN

## 2024-01-31 MED ORDER — PERFLUTREN LIPID MICROSPHERE
1.0000 mL | INTRAVENOUS | Status: AC | PRN
  Administered 2024-01-31: 2 mL via INTRAVENOUS

## 2024-01-31 MED ORDER — FREE WATER: 500.0000 mL | Freq: Once | Status: DC

## 2024-01-31 MED ORDER — ASPIRIN 81 MG PO TBEC: 81.0000 mg | DELAYED_RELEASE_TABLET | Freq: Every day | ORAL | Status: DC

## 2024-01-31 MED ORDER — TICAGRELOR 90 MG PO TABS
90.0000 mg | ORAL_TABLET | Freq: Two times a day (BID) | ORAL | 2 refills | Status: AC
  Filled 2024-01-31: qty 60, 30d supply, fill #0

## 2024-01-31 MED ORDER — TICAGRELOR 90 MG PO TABS
ORAL_TABLET | ORAL | Status: AC
  Filled 2024-01-31: qty 2

## 2024-01-31 MED ORDER — IOHEXOL 350 MG/ML SOLN
INTRAVENOUS | Status: DC | PRN
  Administered 2024-01-31: 150 mL

## 2024-01-31 MED ORDER — ACETAMINOPHEN 325 MG PO TABS: 650.0000 mg | ORAL_TABLET | ORAL | Status: DC | PRN

## 2024-01-31 MED ADMIN — Midazolam HCl Inj PF 2 MG/2ML (Base Equivalent): 1 mg | INTRAVENOUS | NDC 00409000125

## 2024-01-31 MED ADMIN — Rosuvastatin Calcium Tab 20 MG: 20 mg | ORAL | NDC 65862029590

## 2024-01-31 MED ADMIN — Lidocaine HCl Local Preservative Free (PF) Inj 1%: 2 mL | NDC 55150016330

## 2024-01-31 MED ADMIN — Metoprolol Succinate Tab ER 24HR 25 MG (Tartrate Equiv): 25 mg | ORAL | NDC 00904632206

## 2024-01-31 MED ADMIN — Heparin Sod (Porcine)-NaCl IV Soln 1000 Unit/500ML-0.9%: 500 mL | NDC 00409762013

## 2024-01-31 MED ADMIN — RADIAL COCKTAIL/VERAPAMIL ONLY: 10 mL | INTRA_ARTERIAL | NDC 43066003125

## 2024-01-31 MED FILL — Lidocaine HCl Local Preservative Free (PF) Inj 1%: INTRAMUSCULAR | Qty: 30 | Status: AC

## 2024-01-31 MED FILL — Heparin Sodium (Porcine) Inj 1000 Unit/ML: INTRAMUSCULAR | Qty: 10 | Status: AC

## 2024-01-31 MED FILL — Verapamil HCl IV Soln 2.5 MG/ML: INTRAVENOUS | Qty: 2 | Status: AC

## 2024-01-31 MED FILL — Rosuvastatin Calcium Tab 20 MG: 20.0000 mg | ORAL | Qty: 1 | Status: AC

## 2024-01-31 MED FILL — Metoprolol Succinate Tab ER 24HR 25 MG (Tartrate Equiv): 25.0000 mg | ORAL | Qty: 1 | Status: AC

## 2024-01-31 SURGICAL SUPPLY — 19 items
BALLOON EMERGE MR 2.0X12 (BALLOONS) IMPLANT
BALLOON EMERGE MR 2.5X8 (BALLOONS) IMPLANT
BALLOON SAPPHIRE NC24 2.50X12 (BALLOONS) IMPLANT
BALLOON ~~LOC~~ EMERGE MR 2.5X15 (BALLOONS) IMPLANT
CATH INFINITI 5 FR AR2 MOD (CATHETERS) IMPLANT
CATH INFINITI 5 FR IM (CATHETERS) IMPLANT
CATH INFINITI 5 FR MPA2 (CATHETERS) IMPLANT
CATH INFINITI 5FR MULTPACK ANG (CATHETERS) IMPLANT
CATH VISTA GUIDE 6FR MPA1 MLPK (CATHETERS) IMPLANT
DEVICE RAD COMP TR BAND LRG (VASCULAR PRODUCTS) IMPLANT
ELECT DEFIB PAD ADLT CADENCE (PAD) IMPLANT
GLIDESHEATH SLEND SS 6F .021 (SHEATH) IMPLANT
GUIDEWIRE INQWIRE 1.5J.035X260 (WIRE) IMPLANT
KIT ENCORE 26 ADVANTAGE (KITS) IMPLANT
KIT HEMO VALVE WATCHDOG (MISCELLANEOUS) IMPLANT
PACK CARDIAC CATHETERIZATION (CUSTOM PROCEDURE TRAY) ×1 IMPLANT
SET ATX-X65L (MISCELLANEOUS) IMPLANT
STENT SYNERGY XD 2.50X16 (Permanent Stent) IMPLANT
WIRE RUNTHROUGH .014X180CM (WIRE) IMPLANT

## 2024-01-31 NOTE — Progress Notes (Signed)
 Discussed with  pt and family stent, restrictions, Brilinta importance,  diet, exercise, NTG, and CRPII. Pt receptive. Will refer to [facility] CRPII. He is an avid exerciser and watches his diet since '07 CABG. [id number] Aliene Aris BS, ACSM-CEP 01/31/2024 3:25 PM

## 2024-01-31 NOTE — Discharge Summary (Signed)
 Discharge Summary   Patient ID: Noah [name] MRN: [id number]; DOB: [birth year]  Admit date: 01/31/2024 Discharge date: 01/31/2024  PCP:  Okey Carlin Redbird, MD   [facility] Providers Cardiologist:  Lonni Cash, MD     Discharge Diagnoses  Principal Problem:   NSTEMI (non-ST elevated myocardial infarction) [facility] Active Problems:   CAD (coronary artery disease)   Mixed hyperlipidemia   Essential hypertension, benign   Diagnostic Studies/Procedures   Coronary & bypass graft angiography/intervention 01/31/2024: LM: Mid to distal 80% stenosis LAD: Ostial occlusion, bypassed by patent LIMA-LAD Lcx: Proximal 70% disease, bypassed by patent free RIMA-OM 2 RCA: Proximal/mid occlusion, bypassed by patent SVG-RPDA          Mid RPDA 90% focal stenosis distal to bypass graft insertion LIMA-LAD: No significant disease RIMA-OM 2: No significant disease SVG/PDA: No significant disease   LVEDP 1 mmHg   Successful percutaneous coronary intervention RPDA        PTCA and stent placement 2.5 X 16 mm Synergy drug-eluting stent        Postdilatation using 2.5 x 12 mm Sapphire [place] balloon up to 14 atm        Okay to discharge home tonight after 6-hour monitoring.   Newman JINNY Lawrence, MD  Echo: 01/31/2024  IMPRESSIONS     1. Left ventricular ejection fraction, by estimation, is 60 to 65%. The  left ventricle has normal function. The left ventricle demonstrates  regional wall motion abnormalities with inferior hypokinesis. Left  ventricular diastolic parameters are  consistent with Grade I diastolic dysfunction (impaired relaxation).   2. Right ventricular systolic function is mildly reduced. The right  ventricular size is normal. Tricuspid regurgitation signal is inadequate  for assessing PA pressure.   3. The mitral valve is degenerative. Trivial mitral valve regurgitation.  No evidence of mitral stenosis. Moderate mitral annular calcification.   4.  The aortic valve is tricuspid. There is moderate calcification of the  aortic valve. Aortic valve regurgitation is not visualized. Aortic valve  sclerosis/calcification is present, without any evidence of aortic  stenosis.   5. IVC not visualized.   FINDINGS   Left Ventricle: Left ventricular ejection fraction, by estimation, is 60  to 65%. The left ventricle has normal function. The left ventricle  demonstrates regional wall motion abnormalities. The left ventricular  internal cavity size was normal in size.  There is no left ventricular hypertrophy. Left ventricular diastolic  parameters are consistent with Grade I diastolic dysfunction (impaired  relaxation).   Right Ventricle: The right ventricular size is normal. No increase in  right ventricular wall thickness. Right ventricular systolic function is  mildly reduced. Tricuspid regurgitation signal is inadequate for assessing  PA pressure.   Left Atrium: Left atrial size was normal in size.   Right Atrium: Right atrial size was normal in size.   Pericardium: There is no evidence of pericardial effusion.   Mitral Valve: The mitral valve is degenerative in appearance. There is  moderate calcification of the mitral valve leaflet(s). Moderate mitral  annular calcification. Trivial mitral valve regurgitation. No evidence of  mitral valve stenosis.   Tricuspid Valve: The tricuspid valve is normal in structure. Tricuspid  valve regurgitation is not demonstrated.   Aortic Valve: The aortic valve is tricuspid. There is moderate  calcification of the aortic valve. Aortic valve regurgitation is not  visualized. Aortic valve sclerosis/calcification is present, without any  evidence of aortic stenosis.   Pulmonic Valve: The pulmonic valve was normal in structure.  Pulmonic valve  regurgitation is not visualized.   Aorta: The aortic root is normal in size and structure.   Venous: IVC not visualized.   IAS/Shunts: No atrial level  shunt detected by color flow Doppler.   _____________   History of Present Illness   Noah [name] is a 73 y.o. male with CAD, HTN, HLD, paroxysmal A-fib/SVT (not on anticoagulation), CAD status post three-vessel CABG in 2007, LIMA to LAD, free RIMA to OM, SVG to PDA) who was seen 01/31/2024 for the evaluation of chest pain. Most recently had a nuclear stress test in 2022 with no ischemia.  Has had cardiac monitors demonstrating ectopy and brief run of SVT but no atrial fibrillation.  Most recent echo in 2024 with normal ejection fraction and no significant valve disease.  He reported remaining active and exercises almost daily without symptoms.   The day of admission he was at home and around 11 PM had an upset stomach went to the bathroom and after that developed substernal chest pain that was almost burning in nature but then became more central and pressure-like.  Slightly different than his chest pain in 2007; however, he does not have angina usually.  He took Pepcid  with no relief and given persistence of pain called EMS.  EMS arrival he was given aspirin  and nitro x 2 with improvement of his pain from 9 out of 10 to 2 out of 10.  He is now currently chest pain-free.  Denied fevers, chills, syncope, palpitations, orthopnea, PND, or lower extremity edema.   In the ED, he was afebrile and hemodynamically stable.  EKG demonstrated sinus rhythm with left anterior fascicular block, right bundle branch block, EKG does not meet STEMI criteria, more hyperacute t-waves inferiorly.  Labs were notable for K4.1, creatinine 1, hemoglobin 14.8, platelets 218.  Troponin [id number].   Hospital Course    NSTEMI CAD status post CABG '07 (LIMA to LAD, free RIMA to OM, SVG to PDA) -- Presented with chest pain similar to her prior anginal episodes.  High-sensitivity troponin 484>> 2458, EKG with nonspecific changes -- Cardiac catheterization with patent LIMA to LAD, free RIMA to OM and SVG to PDA, successful  PCI/DES to RPDA lesion with recommendations for DAPT with aspirin /Brilinta for at least 1 year. -- Continue aspirin , Brilinta, Crestor  20 mg daily  Hypertension -- Controlled -- Continue amlodipine  2.5 mg daily, Toprol  XL 25 mg daily  Hyperlipidemia -- Continue Crestor  20 mg daily  Patient was seen by Dr. Elmira and deemed stable for discharge home.  Follow-up arranged with office.  Medication sent to [facility] pharmacy.  Educated by [employer].D. prior to discharge.  Did the patient have an acute coronary syndrome (MI, NSTEMI, STEMI, etc) this admission?:  Yes                               AHA/ACC ACS Clinical Performance & Quality Measures: Aspirin  prescribed? - Yes ADP Receptor Inhibitor (Plavix /Clopidogrel , Brilinta/Ticagrelor or Effient/Prasugrel) prescribed (includes medically managed patients)? - Yes Beta Blocker prescribed? - No. The patient has an EF >/= 50%. Based upon the [facility] Study pub in Apr 2024, there is no benefit in patients with preserved EF post MI. High Intensity Statin (Lipitor 40-80mg  or Crestor  20-40mg ) prescribed? - Yes EF assessed during THIS hospitalization? - Yes For EF <40%, was ACEI/ARB prescribed? - Not Applicable (EF >/= 40%) For EF <40%, Aldosterone Antagonist (Spironolactone or Eplerenone) prescribed? - Not Applicable (EF >/=  40%) Cardiac Rehab Phase II ordered (including medically managed patients)? - Yes    The patient will be scheduled for a TOC follow up appointment in 10-14 days.  A message has been sent to the [facility] and Scheduling Pool at the office where the patient should be seen for follow up.  _____________  Discharge Vitals Blood pressure (!) 156/91, pulse 80, temperature 98.1 F (36.7 C), temperature source Oral, resp. rate 16, height 5' 8 (1.727 m), weight 72.6 kg, SpO2 100%.  Filed Weights   01/31/24 1034  Weight: 72.6 kg    Labs & Radiologic Studies  CBC Recent Labs    01/31/24 0116 01/31/24 0455  WBC 9.9 9.3  HGB 14.8 14.7   HCT 44.4 43.4  MCV 91.9 90.4  PLT 218 212   Basic Metabolic Panel Recent Labs    87/87/74 0116 01/31/24 0455  NA 138 137  K 4.1 4.3  CL 101 107  CO2 27 21*  GLUCOSE 126* 107*  BUN 20 18  CREATININE 1.00 0.91  CALCIUM  8.8* 8.5*  MG  --  2.4   Liver Function Tests No results for input(s): AST, ALT, ALKPHOS, BILITOT, PROT, ALBUMIN in the last 72 hours. No results for input(s): LIPASE, AMYLASE in the last 72 hours. High Sensitivity Troponin:   Recent Labs  Lab 01/31/24 0116 01/31/24 0325  TROPONINIHS 484* 2,458*    No results for input(s): TRNPT in the last 720 hours.  BNP Invalid input(s): POCBNP No results for input(s): PROBNP in the last 72 hours.  No results for input(s): BNP in the last 72 hours.  D-Dimer No results for input(s): DDIMER in the last 72 hours. Hemoglobin A1C No results for input(s): HGBA1C in the last 72 hours. Fasting Lipid Panel No results for input(s): CHOL, HDL, LDLCALC, TRIG, CHOLHDL, LDLDIRECT in the last 72 hours. Lipoprotein (a)  Date/Time Value Ref Range Status  02/09/2016 12:06 PM 10 <75 nmol/L Final    Comment:      Risk: Optimal < 75 nmol/L; Moderate 75-125 nmol/L; High > 125 nmol/L Cardiovascular event risk category cut points (optimal, moderate, high) are based on Marcovina et al. Clin Chem. 7996;50:8214 and Nordestgaard et al. European Heart J. [phone] (results of meta-analysis and expert panel recommendations).     Thyroid  Function Tests No results for input(s): TSH, T4TOTAL, T3FREE, THYROIDAB in the last 72 hours.  Invalid input(s): FREET3 _____________  ECHOCARDIOGRAM COMPLETE Result Date: 01/31/2024    ECHOCARDIOGRAM REPORT   Patient Name:   Noah [name] [facility] Date of Exam: 01/31/2024 Medical Rec #:  [id number]          Height:       68.0 in Accession #:    [id number]         Weight:       167.0 lb Date of Birth:  1950/07/15         BSA:          1.893 m Patient Age:     72 years           BP:           147/74 mmHg Patient Gender: M                  HR:           74 bpm. Exam Location:  Inpatient Procedure: 2D Echo, Color Doppler, Cardiac Doppler and Intracardiac            Opacification Agent (Both Spectral and  Color Flow Doppler were            utilized during procedure). Indications:    NSTEMI I21.4  History:        Patient has prior history of Echocardiogram examinations, most                 recent 10/12/2022.  Sonographer:    Tinnie Gosling RDCS Referring Phys: [id number] MICHAEL COSIANO IMPRESSIONS  1. Left ventricular ejection fraction, by estimation, is 60 to 65%. The left ventricle has normal function. The left ventricle demonstrates regional wall motion abnormalities with inferior hypokinesis. Left ventricular diastolic parameters are consistent with Grade I diastolic dysfunction (impaired relaxation).  2. Right ventricular systolic function is mildly reduced. The right ventricular size is normal. Tricuspid regurgitation signal is inadequate for assessing PA pressure.  3. The mitral valve is degenerative. Trivial mitral valve regurgitation. No evidence of mitral stenosis. Moderate mitral annular calcification.  4. The aortic valve is tricuspid. There is moderate calcification of the aortic valve. Aortic valve regurgitation is not visualized. Aortic valve sclerosis/calcification is present, without any evidence of aortic stenosis.  5. IVC not visualized. FINDINGS  Left Ventricle: Left ventricular ejection fraction, by estimation, is 60 to 65%. The left ventricle has normal function. The left ventricle demonstrates regional wall motion abnormalities. The left ventricular internal cavity size was normal in size. There is no left ventricular hypertrophy. Left ventricular diastolic parameters are consistent with Grade I diastolic dysfunction (impaired relaxation). Right Ventricle: The right ventricular size is normal. No increase in right ventricular wall thickness. Right  ventricular systolic function is mildly reduced. Tricuspid regurgitation signal is inadequate for assessing PA pressure. Left Atrium: Left atrial size was normal in size. Right Atrium: Right atrial size was normal in size. Pericardium: There is no evidence of pericardial effusion. Mitral Valve: The mitral valve is degenerative in appearance. There is moderate calcification of the mitral valve leaflet(s). Moderate mitral annular calcification. Trivial mitral valve regurgitation. No evidence of mitral valve stenosis. Tricuspid Valve: The tricuspid valve is normal in structure. Tricuspid valve regurgitation is not demonstrated. Aortic Valve: The aortic valve is tricuspid. There is moderate calcification of the aortic valve. Aortic valve regurgitation is not visualized. Aortic valve sclerosis/calcification is present, without any evidence of aortic stenosis. Pulmonic Valve: The pulmonic valve was normal in structure. Pulmonic valve regurgitation is not visualized. Aorta: The aortic root is normal in size and structure. Venous: IVC not visualized. IAS/Shunts: No atrial level shunt detected by color flow Doppler.  LEFT VENTRICLE PLAX 2D LVIDd:         4.80 cm      Diastology LVIDs:         2.90 cm      LV e' medial:    6.74 cm/s LV PW:         0.90 cm      LV E/e' medial:  13.3 LV IVS:        0.90 cm      LV e' lateral:   13.30 cm/s LVOT diam:     1.70 cm      LV E/e' lateral: 6.7 LV SV:         67 LV SV Index:   35 LVOT Area:     2.27 cm  LV Volumes (MOD) LV vol d, MOD A2C: 119.0 ml LV vol d, MOD A4C: 124.0 ml LV vol s, MOD A2C: 26.6 ml LV vol s, MOD A4C: 29.6 ml LV SV MOD A2C:  92.4 ml LV SV MOD A4C:     124.0 ml LV SV MOD BP:      93.8 ml RIGHT VENTRICLE RV S prime:     11.90 cm/s  PULMONARY VEINS TAPSE (M-mode): 1.3 cm      Diastolic Velocity: 50.60 cm/s                             S/D Velocity:       0.90                             Systolic Velocity:  45.20 cm/s LEFT ATRIUM             Index        RIGHT ATRIUM            Index LA diam:        3.80 cm 2.01 cm/m   RA Area:     13.40 cm LA Vol (A2C):   44.2 ml 23.35 ml/m  RA Volume:   30.20 ml  15.95 ml/m LA Vol (A4C):   40.4 ml 21.34 ml/m LA Biplane Vol: 44.1 ml 23.30 ml/m  AORTIC VALVE LVOT Vmax:   140.00 cm/s LVOT Vmean:  101.000 cm/s LVOT VTI:    0.295 m  AORTA Ao Root diam: 3.10 cm Ao Asc diam:  2.70 cm MITRAL VALVE MV Area (PHT): 3.72 cm    SHUNTS MV Decel Time: 204 msec    Systemic VTI:  0.30 m MV E velocity: 89.60 cm/s  Systemic Diam: 1.70 cm MV A velocity: 89.60 cm/s MV E/A ratio:  1.00 Dalton McleanMD Electronically signed by Ezra Kanner Signature Date/Time: 01/31/2024/3:17:23 PM    Final    CARDIAC CATHETERIZATION Result Date: 01/31/2024 Images from the original result were not included. Coronary & bypass graft angiography/intervention 01/31/2024: LM: Mid to distal 80% stenosis LAD: Ostial occlusion, bypassed by patent LIMA-LAD Lcx: Proximal 70% disease, bypassed by patent free RIMA-OM 2 RCA: Proximal/mid occlusion, bypassed by patent SVG-RPDA          Mid RPDA 90% focal stenosis distal to bypass graft insertion LIMA-LAD: No significant disease RIMA-OM 2: No significant disease SVG/PDA: No significant disease LVEDP 1 mmHg Successful percutaneous coronary intervention RPDA        PTCA and stent placement 2.5 X 16 mm Synergy drug-eluting stent        Postdilatation using 2.5 x 12 mm Sapphire [place] balloon up to 14 atm Okay to discharge home tonight after 6-hour monitoring. Newman JINNY Lawrence, MD   DG Chest 2 View Result Date: 01/31/2024 EXAM: 2 VIEW(S) X-RAY OF THE CHEST 01/31/2024 01:29:00 AM COMPARISON: 07/16/2005 CLINICAL HISTORY: Chest pain FINDINGS: LUNGS AND PLEURA: No focal pulmonary opacity. No pleural effusion. No pneumothorax. HEART AND MEDIASTINUM: Status post CABG. No acute abnormality of the cardiac and mediastinal silhouettes. BONES AND SOFT TISSUES: Median sternotomy. No acute osseous abnormality. IMPRESSION: 1. No acute cardiopulmonary  process. Electronically signed by: Pinkie Pebbles MD 01/31/2024 01:33 AM EST RP Workstation: [id number]    Disposition Pt is being discharged home today in good condition.  Follow-up Plans & Appointments  Discharge Instructions     Amb Referral to Cardiac Rehabilitation   Complete by: As directed    Diagnosis:  Coronary Stents PTCA NSTEMI     After initial evaluation and assessments completed: Virtual Based Care may be provided alone or in conjunction with Phase 2  Cardiac Rehab based on patient barriers.: Yes   Intensive Cardiac Rehabilitation (ICR) [facility] location only OR Traditional Cardiac Rehabilitation (TCR) *If criteria for ICR are not met will enroll in TCR [facility] only): Yes       Discharge Medications Allergies as of 01/31/2024       Reactions   Cephalexin Other (See Comments)   REACTION: tachycardia Rapid heart beat Tolerated Ancef  12/10/23   Levofloxacin Other (See Comments)   REACTION: tachycardia Rapid heartbeat   Zoster Vac Recomb Adjuvanted Other (See Comments)   Shakes, drove him crazy    Clarithromycin Other (See Comments)   Mouth got white spots.   Penicillins Rash, Other (See Comments)   REACTION: rash Unsure   Esomeprazole  Other (See Comments)   Fluticasone Other (See Comments)   Ciprofloxacin Diarrhea   Doxycycline Other (See Comments)   Lipitor [atorvastatin] Palpitations   Muscle aches on lipitor 80 mg daily        Medication List     STOP taking these medications    ibuprofen  200 MG tablet Commonly known as: ADVIL        TAKE these medications    amLODipine  2.5 MG tablet Commonly known as: NORVASC  Take 1 tablet (2.5 mg total) by mouth daily.   aspirin  EC 81 MG tablet Take 1 tablet (81 mg total) by mouth daily.   calcium  carbonate 500 MG chewable tablet Commonly known as: TUMS - dosed in mg elemental calcium  Chew 1 tablet by mouth daily as needed.   Coenzyme Q10 100 MG capsule Take 100 mg by mouth daily.   famotidine  20 MG  tablet Commonly known as: PEPCID  Take 20 mg by mouth 2 (two) times daily.   Flaxseed (Linseed) Powd Take 2 scoop by mouth as directed.   hydrocortisone 2.5 % cream Apply 1 Application topically daily as needed (Apply topically as needed).   LORazepam 0.5 MG tablet Commonly known as: ATIVAN Take 1 tablet by mouth at bedtime as needed for sleep. To help with sleep   metoprolol  succinate 25 MG 24 hr tablet Commonly known as: TOPROL -XL Take 1 tablet (25 mg total) by mouth 2 (two) times daily.   multivitamin with minerals Tabs tablet Take 1 tablet by mouth daily. Centrum silver   nitroGLYCERIN  0.4 MG SL tablet Commonly known as: NITROSTAT  Place 1 tablet (0.4 mg total) under the tongue every 5 (five) minutes as needed for chest pain (3 DOSES MAX).   pramoxine-hydrocortisone 1-1 % rectal cream Commonly known as: PROCTOCREAM-HC Apply topically.   pramoxine-hydrocortisone cream Commonly known as: ANALPRAM HC Apply 1 application topically at bedtime as needed (for skin).   rosuvastatin  20 MG tablet Commonly known as: CRESTOR  TAKE 1 TABLET BY MOUTH DAILY   ticagrelor 90 MG Tabs tablet Commonly known as: Brilinta Take 1 tablet (90 mg total) by mouth 2 (two) times daily.   traMADol  50 MG tablet Commonly known as: Ultram  Take 1 tablet (50 mg total) by mouth every 6 (six) hours as needed for severe pain (pain score 7-10).   Vitamin D 50 MCG (2000 UT) tablet Take 2,000 Units by mouth daily.   Zirgan 0.15 % Gel Generic drug: Ganciclovir 1 application. As needed for cold sores         Outstanding Labs/Studies  N/a   Duration of Discharge Encounter: APP Time: 20 minutes   Signed, Manuelita Rummer, NP 01/31/2024, 4:24 PM

## 2024-01-31 NOTE — Progress Notes (Signed)
 PHARMACY - ANTICOAGULATION CONSULT NOTE  Pharmacy Consult for Heparin  Indication: chest pain/ACS  Allergies[1]  Vital Signs: Temp: 97.7 F (36.5 C) (12/12 0102) BP: 121/64 (12/12 0102) Pulse Rate: 65 (12/12 0102)  Labs: Recent Labs    01/31/24 0116 01/31/24 0325 01/31/24 0455  HGB 14.8  --  14.7  HCT 44.4  --  43.4  PLT 218  --  212  CREATININE 1.00  --   --   TROPONINIHS 484* 2,458*  --     CrCl cannot be calculated (Unknown ideal weight.).   Medical History: Past Medical History:  Diagnosis Date   Allergy    seasonal- dust mites , tree pollen - worse in Spring    Anxiety    Arthritis    Coronary artery disease 07/13/2005   CABG   DDD (degenerative disc disease)    DJD (degenerative joint disease)    Elevated liver enzymes    GERD (gastroesophageal reflux disease)    Hemangioma of liver    Hemorrhoids    Hyperlipemia    Hypertension    PAC (premature atrial contraction)    Paroxysmal atrial fibrillation (HCC)    1 run of A Fib only- after heart surgery    Pneumonia    PVC (premature ventricular contraction)     Assessment: 73 y/o M presents to the ED with chest pain and elevated troponin. Remote history of CABG. Starting heparin. Labs above reviewed. PTA meds reviewed.   Goal of Therapy:  Heparin level 0.3-0.7 units/ml Monitor platelets by anticoagulation protocol: Yes   Plan:  Heparin 4000 units BOLUS Start heparin drip at 1000 units/hr Heparin level in 8 hours Daily CBC/Heparin level Monitor for bleeding  Lynwood Mckusick, PharmD, BCPS Clinical Pharmacist Phone: [phone]       [1]  Allergies Allergen Reactions   Cephalexin Other (See Comments)    REACTION: tachycardia Rapid heart beat Tolerated Ancef  12/10/23   Levofloxacin Other (See Comments)    REACTION: tachycardia Rapid heartbeat   Zoster Vac Recomb Adjuvanted Other (See Comments)    Shakes, drove him crazy    Clarithromycin Other (See Comments)    Mouth got white spots.    Penicillins Rash and Other (See Comments)    REACTION: rash Unsure   Ciprofloxacin Diarrhea   Doxycycline Other (See Comments)   Lipitor [Atorvastatin] Palpitations    Muscle aches on lipitor 80 mg daily

## 2024-01-31 NOTE — Progress Notes (Signed)
 TR band removed at 1730, gauze dressing applied. Left radial level 0, clean, dry, and intact.

## 2024-01-31 NOTE — Telephone Encounter (Signed)
 Pharmacy Patient Advocate Encounter  Insurance verification completed.    The patient is insured through [facility]. Patient has Medicare and is not eligible for a copay card, but may be able to apply for patient assistance or Medicare RX Payment Plan (Patient Must reach out to their plan, if eligible for payment plan), if available.    Ran test claim for Brilinta 90mg  tablet and the current 30 day co-pay is $40.   This test claim was processed through [employer]- copay amounts may vary at other pharmacies due to [employer], or as the patient moves through the different stages of their insurance plan.

## 2024-01-31 NOTE — Interval H&P Note (Signed)
 History and Physical Interval Note:  01/31/2024 12:26 PM  Noah [name]  has presented today for surgery, with the diagnosis of nstemi.  The various methods of treatment have been discussed with the patient and family. After consideration of risks, benefits and other options for treatment, the patient has consented to  Procedures: LEFT HEART CATH AND CORONARY ANGIOGRAPHY (N/A) as a surgical intervention.  The patient's history has been reviewed, patient examined, no change in status, stable for surgery.  I have reviewed the patient's chart and labs.  Questions were answered to the patient's satisfaction.     Noah [name]

## 2024-01-31 NOTE — Discharge Instructions (Signed)

## 2024-01-31 NOTE — Progress Notes (Signed)
PHARMACIST - PHYSICIAN ORDER COMMUNICATION  CONCERNING: P&T Medication Policy on Herbal Medications  DESCRIPTION:  This patient's order for:  Co Enzyme Q10  has been noted.  This product(s) is classified as an "herbal" or natural product. Due to a lack of definitive safety studies or FDA approval, nonstandard manufacturing practices, plus the potential risk of unknown drug-drug interactions while on inpatient medications, the Pharmacy and Therapeutics Committee does not permit the use of "herbal" or natural products of this type within [facility].   ACTION TAKEN: The pharmacy department is unable to verify this order at this time and your patient has been informed of this safety policy. Please reevaluate patient's clinical condition at discharge and address if the herbal or natural product(s) should be resumed at that time.   

## 2024-01-31 NOTE — Progress Notes (Signed)
°  Echocardiogram 2D Echocardiogram has been performed.  Noah [name] RDCS 01/31/2024, 10:25 AM

## 2024-01-31 NOTE — ED Triage Notes (Signed)
 Acute onset substernal chest pain nonradiating. Was trying to go to sleep. 9/10 and nothing made worse or better. Hx of cabag and triple bypass. Aspirin  given by EMS. 2 nitros sublingual and pain has gone from 9 to 2.

## 2024-01-31 NOTE — ED Notes (Signed)
 CCMD called.

## 2024-01-31 NOTE — ED Provider Notes (Signed)
 [facility] Provider Note   CSN: [id number] Arrival date & time: 01/31/24  0057     History Chief Complaint  Patient presents with   Chest Pain    HPI Noah [name] is a 73 y.o. male presenting for chief complaint of chest pain. States that after dinner he started getting chest pain. Attributed to GI symptoms and trialed antacid without improvement. Progressed into the night.  EMS called. Administered NTG with improvement as well as ASA States he does not get chest painat baseline since his bypass 18 years ago Pain was fixed in his chest.  Patient's recorded medical, surgical, social, medication list and allergies were reviewed in the Snapshot window as part of the initial history.   Review of Systems   Review of Systems  Constitutional:  Negative for chills and fever.  HENT:  Negative for ear pain and sore throat.   Eyes:  Negative for pain and visual disturbance.  Respiratory:  Negative for cough and shortness of breath.   Cardiovascular:  Positive for chest pain. Negative for palpitations.  Gastrointestinal:  Negative for abdominal pain and vomiting.  Genitourinary:  Negative for dysuria and hematuria.  Musculoskeletal:  Negative for arthralgias and back pain.  Skin:  Negative for color change and rash.  Neurological:  Negative for seizures and syncope.  All other systems reviewed and are negative.   Physical Exam Updated Vital Signs BP 121/64 (BP Location: Left Arm)   Pulse 65   Temp 97.7 F (36.5 C)   Resp 17   SpO2 97%  Physical Exam Vitals and nursing note reviewed.  Constitutional:      General: He is not in acute distress.    Appearance: He is well-developed.  HENT:     Head: Normocephalic and atraumatic.  Eyes:     Conjunctiva/sclera: Conjunctivae normal.  Cardiovascular:     Rate and Rhythm: Normal rate and regular rhythm.     Heart sounds: No murmur heard. Pulmonary:     Effort: Pulmonary effort  is normal. No respiratory distress.     Breath sounds: Normal breath sounds.  Abdominal:     Palpations: Abdomen is soft.     Tenderness: There is no abdominal tenderness.  Musculoskeletal:        General: No swelling.     Cervical back: Neck supple.  Skin:    General: Skin is warm and dry.     Capillary Refill: Capillary refill takes less than 2 seconds.  Neurological:     Mental Status: He is alert.  Psychiatric:        Mood and Affect: Mood normal.      ED Course/ Medical Decision Making/ A&P    Procedures .Critical Care  Performed by: Jerral Meth, MD Authorized by: Jerral Meth, MD   Critical care provider statement:    Critical care time (minutes):  30   Critical care was necessary to treat or prevent imminent or life-threatening deterioration of the following conditions:  Circulatory failure   Critical care was time spent personally by me on the following activities:  Development of treatment plan with patient or surrogate, discussions with consultants, evaluation of patient's response to treatment, examination of patient, ordering and review of laboratory studies, ordering and review of radiographic studies, ordering and performing treatments and interventions, pulse oximetry, re-evaluation of patient's condition and review of old charts   Care discussed with: admitting provider      Medications Ordered in ED Medications -  No data to display Medical Decision Making: Noah [name] is a 73 y.o. male who presented to the ED today with chest pain, detailed above.  Based on patient's comorbidities, patient has a baseline elevated risk of ACS.    Handoff received from EMS.  Patient placed on continuous vitals and telemetry monitoring while in ED which was reviewed periodically.  Complete initial physical exam performed, notably the patient was HDS in NAD.   Reviewed and confirmed nursing documentation for past medical history, family history, social history.     Initial Assessment: With the patient's presentation of left-sided chest pain, most likely diagnosis is musculoskeletal chest pain versus GERD, although ACS remains on the differential. Other diagnoses were considered including (but not limited to) pulmonary embolism, community-acquired pneumonia, aortic dissection, pneumothorax, underlying bony abnormality, anemia. These are considered less likely due to history of present illness and physical exam findings.    This is most consistent with an acute life/limb threatening illness complicated by underlying chronic conditions.   Initial Plan: EKG and serial troponin to evaluate for cardiac pathology. Evaluate for dissection, bony abnormality, or pneumonia with chest x-ray and screening laboratory evaluation including CBC, BMP  Further evaluation for Thoracic Aortic Dissection not indicated at this time based on patient's clinical history and PE findings.   Initial Study Results: EKG was reviewed independently. Rate, rhythm, axis, intervals all examined and without medically relevant abnormality. ST segments without concerns for elevations.    Laboratory  Delta troponin demonstrated NAA   CBC and BMP without obvious metabolic or inflammatory abnormalities requiring further evaluation   Radiology  DG Chest 2 View Result Date: 01/31/2024 EXAM: 2 VIEW(S) X-RAY OF THE CHEST 01/31/2024 01:29:00 AM COMPARISON: 07/16/2005 CLINICAL HISTORY: Chest pain FINDINGS: LUNGS AND PLEURA: No focal pulmonary opacity. No pleural effusion. No pneumothorax. HEART AND MEDIASTINUM: Status post CABG. No acute abnormality of the cardiac and mediastinal silhouettes. BONES AND SOFT TISSUES: Median sternotomy. No acute osseous abnormality. IMPRESSION: 1. No acute cardiopulmonary process. Electronically signed by: Pinkie Pebbles MD 01/31/2024 01:33 AM EST RP Workstation: [id number]    Final Assessment and Plan: Chest pain resolved. However concerned for NSTEMI given  findings on troponin values.  Consult to cardiology who feels comfortable admitting the patient.  Appreciate their assistance in his management.  Patient admitted with no further acute events.  Clinical Impression:  1. NSTEMI (non-ST elevated myocardial infarction) (HCC)      Data Unavailable   Final Clinical Impression(s) / ED Diagnoses Final diagnoses:  NSTEMI (non-ST elevated myocardial infarction) [facility])    Rx / DC Orders ED Discharge Orders     None         Jerral Meth, MD 01/31/24 [phone]

## 2024-01-31 NOTE — H&P (Addendum)
 Cardiology Admission History and Physical   Patient ID: WOFFORD STRATTON MRN: [id number]; DOB: March 22, 1950   Admission date: 01/31/2024  PCP:  Okey Carlin Redbird, MD   [facility] Providers Cardiologist:  Lonni Cash, MD       Chief Complaint:  Chest pain   Patient Profile: Noah [name] is a 73 y.o. male with CAD, HTN, HLD, paroxysmal A-fib/SVT (not on anticoagulation), CAD status post three-vessel CABG in 2007, LIMA to LAD, free RIMA to OM, SVG to PDA) who is being seen 01/31/2024 for the evaluation of chest pain.  History of Present Illness: Noah [name] has been doing relatively well since his CABG in 2007.  Most recently had a nuclear stress test in 2022 with no ischemia.  Has had cardiac monitors demonstrating ectopy and brief run of SVT but no atrial fibrillation.  Most recent echo in 2024 with normal ejection fraction and no significant valve disease.  He remains active and exercises almost daily without symptoms.  Today, he was at home and around 11 PM had an upset stomach went to the bathroom and after that developed substernal chest pain that was almost burning in nature but then became more central and pressure-like.  Slightly different than his chest pain in 2007; however, he does not have angina usually.  He took Pepcid  with no relief and given persistence of pain called EMS.  EMS arrival he was given aspirin  and nitro x 2 with improvement of his pain from 9 out of 10 to 2 out of 10.  He is now currently chest pain-free.  Denies fevers, chills, syncope, palpitations, orthopnea, PND, or lower extremity edema.  In the ED, he was afebrile and hemodynamically stable.  EKG demonstrated sinus rhythm with left anterior fascicular block, right bundle branch block, EKG does not meet STEMI criteria, more hyperacute t-waves inferiorly.  Labs were notable for K4.1, creatinine 1, hemoglobin 14.8, platelets 218.  Troponin [id number].    Past Medical History:   Diagnosis Date   Allergy    seasonal- dust mites , tree pollen - worse in Spring    Anxiety    Arthritis    Coronary artery disease 07/13/2005   CABG   DDD (degenerative disc disease)    DJD (degenerative joint disease)    Elevated liver enzymes    GERD (gastroesophageal reflux disease)    Hemangioma of liver    Hemorrhoids    Hyperlipemia    Hypertension    PAC (premature atrial contraction)    Paroxysmal atrial fibrillation (HCC)    1 run of A Fib only- after heart surgery    Pneumonia    PVC (premature ventricular contraction)    Past Surgical History:  Procedure Laterality Date   COLONOSCOPY  05/2020   CORONARY ARTERY BYPASS GRAFT  07/13/2005   INGUINAL HERNIA REPAIR Right 1978, 2000   x 2   KNEE SURGERY Right    SEPTOPLASTY     TRIGGER FINGER RELEASE Left 12/10/2023   Procedure: LEFT THUMB A1 PULLEY RELEASE, FOR TRIGGER FINGER;  Surgeon: Josefina Chew, MD;  Location: [facility];  Service: Orthopedics;  Laterality: Left;     Medications Prior to Admission: Prior to Admission medications  Medication Sig Start Date End Date Taking? Authorizing Provider  amLODipine  (NORVASC ) 2.5 MG tablet Take 1 tablet (2.5 mg total) by mouth daily. 12/23/23   Lelon Glendia DASEN, PA-C  aspirin  EC 81 MG tablet Take 1 tablet (81 mg total) by mouth daily. 10/22/17  Weaver, Scott T, PA-C  calcium  carbonate (TUMS - DOSED IN MG ELEMENTAL CALCIUM ) 500 MG chewable tablet Chew 1 tablet by mouth daily as needed.    [provider]  Cholecalciferol (VITAMIN D) 2000 UNITS tablet Take 2,000 Units by mouth daily.    [provider]  Coenzyme Q10 100 MG capsule Take 100 mg by mouth daily.    [provider]  famotidine  (PEPCID ) 20 MG tablet Take 20 mg by mouth 2 (two) times daily.    [provider]  Flaxseed, Linseed, POWD Take 2 scoop by mouth as directed.    [provider]  Ganciclovir (ZIRGAN) 0.15 % GEL 1 application. As needed for cold sores     [provider]  hydrocortisone 2.5 % cream Apply 1 Application topically daily as needed (Apply topically as needed). 12/27/22   [provider]  ibuprofen  (ADVIL ,MOTRIN ) 200 MG tablet Take 200 mg by mouth every 6 (six) hours as needed.    [provider]  LORazepam (ATIVAN) 0.5 MG tablet Take 1 tablet by mouth at bedtime as needed for sleep. To help with sleep 07/02/11   [provider]  metoprolol  succinate (TOPROL -XL) 25 MG 24 hr tablet Take 1 tablet (25 mg total) by mouth 2 (two) times daily. 12/20/23   Verlin Lonni BIRCH, MD  Multiple Vitamin (MULTIVITAMIN WITH MINERALS) TABS Take 1 tablet by mouth daily. Centrum silver    [provider]  nitroGLYCERIN  (NITROSTAT ) 0.4 MG SL tablet Place 1 tablet (0.4 mg total) under the tongue every 5 (five) minutes as needed for chest pain (3 DOSES MAX). 05/21/22   Dann Candyce RAMAN, MD  pramoxine-hydrocortisone [facility]) cream Apply 1 application topically at bedtime as needed (for skin).     [provider]  pramoxine-hydrocortisone (PROCTOCREAM-HC) 1-1 % rectal cream Apply topically. 05/29/19   [provider]  rosuvastatin  (CRESTOR ) 20 MG tablet TAKE 1 TABLET BY MOUTH DAILY 06/12/23   Verlin Lonni BIRCH, MD  traMADol  (ULTRAM ) 50 MG tablet Take 1 tablet (50 mg total) by mouth every 6 (six) hours as needed for severe pain (pain score 7-10). 12/10/23 12/09/24  Brown, Blaine K, PA-C     Allergies:   Allergies[1]  Social History:   Social History   Socioeconomic History   Marital status: Married    Spouse name: Not on file   Number of children: 1   Years of education: Not on file   Highest education level: Not on file  Occupational History   Occupation: retired    Associate Professor: RETIRED  Tobacco Use   Smoking status: Former    Current packs/day: 0.00    Types: Cigarettes    Quit date: 02/19/1974    Years since quitting: 49.9   Smokeless tobacco: Never   Tobacco comments:     stopped 32 yrs ago  Vaping Use   Vaping status: Never Used  Substance and Sexual Activity   Alcohol use: Yes    Comment: rare   Drug use: No   Sexual activity: Not on file  Other Topics Concern   Not on file  Social History Narrative   Pt lives in [place] with spouse.  Son is grown.  Retired from [employer].     patient rides bicycle 7 to 8  Mile a day in a 1/2hr.   Social Drivers of Health   Tobacco Use: Medium Risk (01/31/2024)   Patient History    Smoking Tobacco Use: Former    Smokeless Tobacco Use: Never  Passive Exposure: Not on file  Financial Resource Strain: Not on file  Food Insecurity: Not on file  Transportation Needs: Not on file  Physical Activity: Not on file  Stress: Not on file  Social Connections: Unknown (07/04/2021)   [facility]   Social Network    Social Network: Not on file  Intimate Partner Violence: Unknown (05/26/2021)   Received from [facility]   HITS    Physically Hurt: Not on file    Insult or Talk Down To: Not on file    Threaten Physical Harm: Not on file    Scream or Curse: Not on file  Depression (PHQ2-9): Not on file  Alcohol Screen: Not on file  Housing: Not on file  Utilities: Not on file  Health Literacy: Not on file     Family History:   The patient's family history includes Atrial fibrillation (age of onset: 22) in his mother; Colon polyps in his mother; Coronary artery disease in his father; Heart attack in his father, maternal grandfather, paternal grandfather, and paternal grandmother; Prostate cancer in his maternal uncle; Seizures in his brother. There is no history of Colon cancer, Esophageal cancer, Rectal cancer, or Stomach cancer.    ROS:  Please see the history of present illness.  All other ROS reviewed and negative.     Physical Exam/Data: Vitals:   01/31/24 0102 01/31/24 0156  BP: 121/64   Pulse: 65   Resp: 17   Temp: 97.7 F (36.5 C)   SpO2: 96% 97%   No intake or output data in the 24  hours ending 01/31/24 0412    12/23/2023    1:53 PM 12/10/2023    6:51 AM 12/03/2023   10:57 AM  Last 3 Weights  Weight (lbs) 167 lb 167 lb 8.8 oz 166 lb 10.7 oz  Weight (kg) 75.751 kg 76 kg 75.6 kg     There is no height or weight on file to calculate BMI.  General:  Well nourished, well developed, in no acute distress HEENT: normal Neck: no JVD Vascular: No carotid bruits; Distal pulses 2+ bilaterally   Cardiac:  normal S1, S2; RRR; no murmur  Lungs:  clear to auscultation bilaterally, no wheezing, rhonchi or rales  Abd: soft, nontender, no hepatomegaly  Ext: no edema Musculoskeletal:  No deformities, BUE and BLE strength normal and equal Skin: warm and dry  Neuro:  CNs 2-12 intact, no focal abnormalities noted Psych:  Normal affect   EKG:  The ECG that was done was personally reviewed and demonstrates: Sinus rhythm with right bundle branch block, left anterior fascicular block, no STEMI, inferior t-waves more hyperacute from prior  Relevant CV Studies:  TTE 2024 IMPRESSIONS    1. Left ventricular ejection fraction, by estimation, is 60 to 65%. The  left ventricle has normal function. The left ventricle has no regional  wall motion abnormalities. Left ventricular diastolic parameters were  normal.   2. Right ventricular systolic function is normal. The right ventricular  size is normal.   3. The mitral valve is normal in structure. No evidence of mitral valve  regurgitation. No evidence of mitral stenosis.   4. The aortic valve is normal in structure. There is mild calcification  of the aortic valve. Aortic valve regurgitation is trivial. No aortic  stenosis is present.   5. The inferior vena cava is normal in size with greater than 50%  respiratory variability, suggesting right atrial pressure of 3 mmHg.   Nuc Stress 2022  Nuclear stress EF: 63%. The left ventricular ejection fraction is normal (55-65%). There was no ST segment deviation noted during stress. This is a  low risk study. There is no evidence of ischemia and no evidence of previous infarction. The study is normal.    Laboratory Data: High Sensitivity Troponin:   Recent Labs  Lab 01/31/24 0116  TROPONINIHS 484*   No results for input(s): TRNPT in the last 720 hours.      Chemistry Recent Labs  Lab 01/31/24 0116  NA 138  K 4.1  CL 101  CO2 27  GLUCOSE 126*  BUN 20  CREATININE 1.00  CALCIUM  8.8*  GFRNONAA >60  ANIONGAP 10    No results for input(s): PROT, ALBUMIN, AST, ALT, ALKPHOS, BILITOT in the last 168 hours. Lipids No results for input(s): CHOL, TRIG, HDL, LABVLDL, LDLCALC, CHOLHDL in the last 168 hours. Hematology Recent Labs  Lab 01/31/24 0116  WBC 9.9  RBC 4.83  HGB 14.8  HCT 44.4  MCV 91.9  MCH 30.6  MCHC 33.3  RDW 14.3  PLT 218   Thyroid  No results for input(s): TSH, FREET4 in the last 168 hours. BNPNo results for input(s): BNP, PROBNP in the last 168 hours.  DDimer No results for input(s): DDIMER in the last 168 hours.  Radiology/Studies:  DG Chest 2 View Result Date: 01/31/2024 EXAM: 2 VIEW(S) X-RAY OF THE CHEST 01/31/2024 01:29:00 AM COMPARISON: 07/16/2005 CLINICAL HISTORY: Chest pain FINDINGS: LUNGS AND PLEURA: No focal pulmonary opacity. No pleural effusion. No pneumothorax. HEART AND MEDIASTINUM: Status post CABG. No acute abnormality of the cardiac and mediastinal silhouettes. BONES AND SOFT TISSUES: Median sternotomy. No acute osseous abnormality. IMPRESSION: 1. No acute cardiopulmonary process. Electronically signed by: Pinkie Pebbles MD 01/31/2024 01:33 AM EST RP Workstation: [id number]   Assessment and Plan: NSTEMI CAD status post CABG 2007 (LIMA to LAD, free RIMA to OM, SVG to PDA) HTN, HLD Patient presents with new acute substernal chest pain with elevated troponin consistent with non-ST elevation MI.  No other obvious divers of demand ischemia so this is most consistent with type I acute coronary syndrome  and possible graft failure. Suspect SVG to PDA.  Status post aspirin  and nitro and is now chest pain-free.  Will start heparin and admit for left heart catheterization.  Repeat echo ordered as well.  His underlying cardiac risk factors are well-controlled. - Post aspirin  load, continue 81 mg daily - Heparin per ACS - N.p.o. for left heart cath tomorrow if availability - Repeat TTE - Continue home amlodipine  2.5 mg, metoprolol  XL 25 mg twice daily, Crestor  20 mg - Nitro sublingual as needed  4.  Paroxysmal SVT -Currently in sinus rhythm, beta-blocker as above  Risk Assessment/Risk Scores:   TIMI Risk Score for Unstable Angina or Non-ST Elevation MI:   The patient's TIMI risk score is 5, which indicates a 26% risk of all cause mortality, new or recurrent myocardial infarction or need for urgent revascularization in the next 14 days.   Code Status: Full Code  Severity of Illness: The appropriate patient status for this patient is INPATIENT. Inpatient status is judged to be reasonable and necessary in order to provide the required intensity of service to ensure the patient's safety. The patient's presenting symptoms, physical exam findings, and initial radiographic and laboratory data in the context of their chronic comorbidities is felt to place them at high risk for further clinical deterioration. Furthermore, it is not anticipated that the patient will be medically stable for discharge  from the hospital within 2 midnights of admission.   * I certify that at the point of admission it is my clinical judgment that the patient will require inpatient hospital care spanning beyond 2 midnights from the point of admission due to high intensity of service, high risk for further deterioration and high frequency of surveillance required.*  For questions or updates, please contact [facility] Please consult [email or web address] for contact info under       Signed, Ozell Bushman, MD  01/31/2024  4:12 AM     -------------------------------------------------------------------------------------------------   Noah [name] Musca was seen by me this morning separately. I have personally performed an evaluation on this patient.  My findings are as follows: 73 y.o. male hypertension, hyperlipidemia, SVT (no clear documentation of Afib), CAD s/p CABG x 3 (LIMA-LAD, free RIMA-OM, SVG-PDA), admitted for chest pain.  Pain started last night between 11-12 PM when he went to the bathroom, lasted for 40 min and resolved. Currently chest pain free. No other symptoms at this time.   Trop HS 2458, Cr 0.91, Hb 14.7  Data: EKG(s) and pertinent labs, studies, etc were personally reviewed and interpreted by me:  EKG 01/31/2024: Sinus rhythm 65 bpm Incomplete right bundle branch block Left intrafascicular block Nonspecific ST-T abnormality  Echocardiogram 09/2022:  1. Left ventricular ejection fraction, by estimation, is 60 to 65%. The  left ventricle has normal function. The left ventricle has no regional  wall motion abnormalities. Left ventricular diastolic parameters were  normal.   2. Right ventricular systolic function is normal. The right ventricular  size is normal.   3. The mitral valve is normal in structure. No evidence of mitral valve  regurgitation. No evidence of mitral stenosis.   4. The aortic valve is normal in structure. There is mild calcification  of the aortic valve. Aortic valve regurgitation is trivial. No aortic  stenosis is present.   5. The inferior vena cava is normal in size with greater than 50%  respiratory variability, suggesting right atrial pressure of 3 mmHg.  Otherwise, I agree with data as outlined by the advanced practice provider.  Exam performed by me: No acute distress Normal heart sounds, no murmur No rales No JVD No leg edema   My Assessment and Plan: 73 y.o. male hypertension, hyperlipidemia, SVT (no clear documentation of Afib), CAD s/p  CABG x 3 (LIMA-LAD, free RIMA-OM, SVG-PDA), admitted for chest pain.  NSTEMI: Currently chest pain free. Trops 2400s. Echocardiogram pending.  Continue Aspirin , heparin, amlodipine  2.5, metoprolol  succinate 25, Crestor  20. Plan for coronary and bypass graft angiography and possible intervention today. Left radial access can be used since his RIMA is a free conduit. Further recommendations after the cath  Hypertension: Controlled  Mixed hyperlipidemia: Check lipid panel, currently on Crestor  20 mg daily.   PSVT/PACs: Long history. No clear documentation of Afib. Asymptomatic.    Signed,  Newman [name] Lawrence, MD  01/31/2024 8:34 AM         [1]  Allergies Allergen Reactions   Cephalexin Other (See Comments)    REACTION: tachycardia Rapid heart beat Tolerated Ancef  12/10/23   Levofloxacin Other (See Comments)    REACTION: tachycardia Rapid heartbeat   Zoster Vac Recomb Adjuvanted Other (See Comments)    Shakes, drove him crazy    Clarithromycin Other (See Comments)    Mouth got white spots.   Penicillins Rash and Other (See Comments)    REACTION: rash Unsure   Ciprofloxacin Diarrhea   Doxycycline Other (See Comments)  Lipitor [Atorvastatin] Palpitations    Muscle aches on lipitor 80 mg daily

## 2024-01-31 NOTE — ED Provider Triage Note (Signed)
 Emergency Medicine Provider Triage Evaluation Note  Noah [name] , a 73 y.o. male  was evaluated in triage.  Pt complains of sudden onset chest pain.  Hx of CABG 18 years ago.  Chest pain improved with nitroglycerin .  Denies SOB.    Review of Systems  Positive: cp Negative: fever  Physical Exam  BP 121/64 (BP Location: Left Arm)   Pulse 65   Temp 97.7 F (36.5 C)   Resp 17   SpO2 96%  Gen:   Awake, no distress   Resp:  Normal effort  MSK:   Moves extremities without difficulty  Other:    Medical Decision Making  Medically screening exam initiated at 1:33 AM.  Appropriate orders placed.  Noah [name] was informed that the remainder of the evaluation will be completed by another provider, this initial triage assessment does not replace that evaluation, and the importance of remaining in the ED until their evaluation is complete.     Vicky Charleston, PA-C 01/31/24 9865

## 2024-02-01 ENCOUNTER — Encounter (HOSPITAL_COMMUNITY): Payer: Self-pay | Admitting: Cardiology

## 2024-02-03 LAB — POCT ACTIVATED CLOTTING TIME
Activated Clotting Time: 214 s
Activated Clotting Time: 286 s

## 2024-02-05 ENCOUNTER — Telehealth (HOSPITAL_COMMUNITY): Payer: Self-pay

## 2024-02-05 ENCOUNTER — Other Ambulatory Visit (HOSPITAL_COMMUNITY): Payer: Self-pay

## 2024-02-05 MED FILL — Nitroglycerin IV Soln 100 MCG/ML in D5W: INTRA_ARTERIAL | Qty: 10 | Status: AC

## 2024-02-05 NOTE — Progress Notes (Signed)
 " Cardiology Office Note   Date:  02/19/2024  ID:  Noah [name], DOB 1950-07-11, MRN [id number] PCP: Okey Carlin Redbird, MD  [facility] Providers Cardiologist:  Lonni Cash, MD   History of Present Illness Noah [name] is a 73 y.o. male with a past medical history of  Coronary artery disease S/p CABG 2007 SPECT MPI 04/27/2020: No ischemia or infarction, EF 63, low risk TTE 10/12/2022: EF 60-65, no RWMA, normal RVSF, trivial AI Remote paroxysmal atrial fibrillation Not on anticoagulation Hypertension Hyperlipidemia LP(a) 10 (2017) Carotid artery disease Carotid US  05/07/2022: Right 1-39 Aortic atherosclerosis Right bundle branch block PVCs SVT Monitor 04/2023: Sinus rhythm, average heart rate 65, 13 SVT runs (longest 14 beats), PVCs <1%  Patient presents today for hospital follow up appointment. Recently admitted with NSTEMI 01/31/24. Presented with chest pain that felt like burning. Progressed and felt more pressure-like. hsTn peaked at 2458. Underwent cath 01/31/24 that showed mid RPDA 90% focal stenosis distal to bypass graft insertion. This was treated with DES. LIMA-LAD, RIMA-OM2, and SVG-PDA were patent. Echocardiogram that admission showed EF 60-65% with wall motion abnormalities, grade I DD, mildly reduced RV systolic function.   Today, patient presents for hospital follow-up appointment.  We reviewed the results of his recent cardiac catheterization and echocardiogram.  Patient tells me that he has been doing very well since getting out of the hospital.  He continues to exercise every day.  Denies chest pain or dyspnea with exertion.  Denies palpitations, syncope, near syncope.  He initially he did have some bruising in his left arm but this has resolved.  His left radial cath site has healed as expected and is soft, nontender.  He has been compliant with his aspirin  and Brilinta  and has not had any bruising or bleeding.  He is very health-conscious,  follows a Mediterranean diet, closely monitors his blood pressure and heart rate.  His blood pressure is often in the 120s systolic at home.  Tells me that this is a bit higher than it has been in the past for him.  If he checks his blood pressure after exercising, his blood pressure is a bit elevated.  We discussed that this is a normal response.   Studies Reviewed EKG Interpretation Date/Time:  Wednesday February 19 2024 08:17:52 EST Ventricular Rate:  73 PR Interval:  196 QRS Duration:  110 QT Interval:  390 QTC Calculation: 429 R Axis:   -34  Text Interpretation: Normal sinus rhythm Left axis deviation Right bundle branch block When compared with ECG of 31-Jan-2024 05:18, PREVIOUS ECG IS PRESENT Confirmed by Vicci Sauer [phone]) on 02/19/2024 12:21:37 PM    Cardiac Studies & Procedures   ______________________________________________________________________________________________ CARDIAC CATHETERIZATION  CARDIAC CATHETERIZATION 01/31/2024  Conclusion Images from the original result were not included. Coronary & bypass graft angiography/intervention 01/31/2024: LM: Mid to distal 80% stenosis LAD: Ostial occlusion, bypassed by patent LIMA-LAD Lcx: Proximal 70% disease, bypassed by patent free RIMA-OM 2 RCA: Proximal/mid occlusion, bypassed by patent SVG-RPDA Mid RPDA 90% focal stenosis distal to bypass graft insertion LIMA-LAD: No significant disease RIMA-OM 2: No significant disease SVG/PDA: No significant disease  LVEDP 1 mmHg  Successful percutaneous coronary intervention RPDA PTCA and stent placement 2.5 X 16 mm Synergy drug-eluting stent Postdilatation using 2.5 x 12 mm S[place]hire [place] balloon up to 14 atm     Okay to discharge home tonight after 6-hour monitoring.  Newman JINNY Lawrence, MD  Findings Coronary Findings Diagnostic  Dominance: Right  Left Main Mid  LM to Ost LAD lesion is 80% stenosed.  Left Anterior Descending Ost LAD to Prox LAD lesion is  100% stenosed.  Left Anterior Descending Ost LAD to Prox LAD lesion is 100% stenosed.  Left Circumflex Ost Cx to Prox Cx lesion is 70% stenosed.  Right Coronary Artery Prox RCA to Mid RCA lesion is 100% stenosed.  Right Posterior Descending Artery RPDA lesion is 90% stenosed.  Graft To RPDA And is normal in caliber.  The graft exhibits no disease.  LIMA Graft To Mid LAD  RIMA Graft To 2nd Mrg RIMA.  Intervention  RPDA lesion Stent CATH VISTA GUIDE 6FR MPA1 MLPK guide catheter was inserted. Lesion crossed with guidewire using a WIRE RUNTHROUGH .[id number]. Pre-stent angioplasty was performed using a BALLOON EMERGE MR 2.0X12. Maximum pressure:  8 atm. Inflation time:  15 sec.  A second angioplasty balloon was used, using a BALLOON EMERGE MR 2.5X8. Maximum pressure:  8 atm. Inflation time:  15 sec. A drug-eluting stent was successfully placed using a STENT SYNERGY XD 2.50X16. Maximum pressure: 12 atm. Inflation time: 30 sec. Stent strut is well apposed. Post-stent angioplasty was performed using a BALLOON SAPPHIRE NC24 [id number]. Maximum pressure:  14 atm. Inflation time:  20 sec. Post-Intervention Lesion Assessment The intervention was successful. Pre-interventional TIMI flow is 3. Post-intervention TIMI flow is 3. No complications occurred at this lesion. There is a 0% residual stenosis post intervention.   STRESS TESTS  MYOCARDIAL PERFUSION IMAGING 04/27/2020  Interpretation Summary  Nuclear stress EF: 63%. The left ventricular ejection fraction is normal (55-65%).  There was no ST segment deviation noted during stress.  This is a low risk study. There is no evidence of ischemia and no evidence of previous infarction.  The study is normal.   ECHOCARDIOGRAM  ECHOCARDIOGRAM COMPLETE 01/31/2024  Narrative ECHOCARDIOGRAM REPORT    Patient Name:   [name] Date of Exam: 01/31/2024 Medical Rec #:  [id number]          Height:       68.0 in Accession #:     [id number]         Weight:       167.0 lb Date of Birth:  [birth year]         BSA:          1.893 m Patient Age:    72 years           BP:           147/74 mmHg Patient Gender: M                  HR:           74 bpm. Exam Location:  Inpatient  Procedure: 2D Echo, Color Doppler, Cardiac Doppler and Intracardiac Opacification Agent (Both Spectral and Color Flow Doppler were utilized during procedure).  Indications:    NSTEMI I21.4  History:        Patient has prior history of Echocardiogram examinations, most recent 10/12/2022.  Sonographer:    Tinnie Gosling RDCS Referring Phys: [id number] MICHAEL COSIANO  IMPRESSIONS   1. Left ventricular ejection fraction, by estimation, is 60 to 65%. The left ventricle has normal function. The left ventricle demonstrates regional wall motion abnormalities with inferior hypokinesis. Left ventricular diastolic parameters are consistent with Grade I diastolic dysfunction (impaired relaxation). 2. Right ventricular systolic function is mildly reduced. The right ventricular size is normal. Tricuspid regurgitation signal is inadequate for assessing PA pressure. 3. The mitral valve is degenerative.  Trivial mitral valve regurgitation. No evidence of mitral stenosis. Moderate mitral annular calcification. 4. The aortic valve is tricuspid. There is moderate calcification of the aortic valve. Aortic valve regurgitation is not visualized. Aortic valve sclerosis/calcification is present, without any evidence of aortic stenosis. 5. IVC not visualized.  FINDINGS Left Ventricle: Left ventricular ejection fraction, by estimation, is 60 to 65%. The left ventricle has normal function. The left ventricle demonstrates regional wall motion abnormalities. The left ventricular internal cavity size was normal in size. There is no left ventricular hypertrophy. Left ventricular diastolic parameters are consistent with Grade I diastolic dysfunction (impaired relaxation).  Right  Ventricle: The right ventricular size is normal. No increase in right ventricular wall thickness. Right ventricular systolic function is mildly reduced. Tricuspid regurgitation signal is inadequate for assessing PA pressure.  Left Atrium: Left atrial size was normal in size.  Right Atrium: Right atrial size was normal in size.  Pericardium: There is no evidence of pericardial effusion.  Mitral Valve: The mitral valve is degenerative in appearance. There is moderate calcification of the mitral valve leaflet(s). Moderate mitral annular calcification. Trivial mitral valve regurgitation. No evidence of mitral valve stenosis.  Tricuspid Valve: The tricuspid valve is normal in structure. Tricuspid valve regurgitation is not demonstrated.  Aortic Valve: The aortic valve is tricuspid. There is moderate calcification of the aortic valve. Aortic valve regurgitation is not visualized. Aortic valve sclerosis/calcification is present, without any evidence of aortic stenosis.  Pulmonic Valve: The pulmonic valve was normal in structure. Pulmonic valve regurgitation is not visualized.  Aorta: The aortic root is normal in size and structure.  Venous: IVC not visualized.  IAS/Shunts: No atrial level shunt detected by color flow Doppler.   LEFT VENTRICLE PLAX 2D LVIDd:         4.80 cm      Diastology LVIDs:         2.90 cm      LV e' medial:    6.74 cm/s LV PW:         0.90 cm      LV E/e' medial:  13.3 LV IVS:        0.90 cm      LV e' lateral:   13.30 cm/s LVOT diam:     1.70 cm      LV E/e' lateral: 6.7 LV SV:         67 LV SV Index:   35 LVOT Area:     2.27 cm  LV Volumes (MOD) LV vol d, MOD A2C: 119.0 ml LV vol d, MOD A4C: 124.0 ml LV vol s, MOD A2C: 26.6 ml LV vol s, MOD A4C: 29.6 ml LV SV MOD A2C:     92.4 ml LV SV MOD A4C:     124.0 ml LV SV MOD BP:      93.8 ml  RIGHT VENTRICLE RV S prime:     11.90 cm/s  PULMONARY VEINS TAPSE (M-mode): 1.3 cm      Diastolic Velocity: 50.60  cm/s S/D Velocity:       0.90 Systolic Velocity:  45.20 cm/s  LEFT ATRIUM             Index        RIGHT ATRIUM           Index LA diam:        3.80 cm 2.01 cm/m   RA Area:     13.40 cm LA Vol (A2C):   44.2 ml 23.35 ml/m  RA Volume:   30.20 ml  15.95 ml/m LA Vol (A4C):   40.4 ml 21.34 ml/m LA Biplane Vol: 44.1 ml 23.30 ml/m AORTIC VALVE LVOT Vmax:   140.00 cm/s LVOT Vmean:  101.000 cm/s LVOT VTI:    0.295 m  AORTA Ao Root diam: 3.10 cm Ao Asc diam:  2.70 cm  MITRAL VALVE MV Area (PHT): 3.72 cm    SHUNTS MV Decel Time: 204 msec    Systemic VTI:  0.30 m MV E velocity: 89.60 cm/s  Systemic Diam: 1.70 cm MV A velocity: 89.60 cm/s MV E/A ratio:  1.00  Dalton McleanMD Electronically signed by Ezra Kanner Signature Date/Time: 01/31/2024/3:17:23 PM    Final    MONITORS  LONG TERM MONITOR (3-14 DAYS) 05/02/2023  Narrative Patch Wear Time:  3 days and 15 hours (2025-03-04T12:57:38-0500 to 2025-03-08T04:10:09-498)  Sinus rhythm. (min HR of 46 bpm, max HR of 171 bpm, and avg HR of 65 bpm). 13 Supraventricular Tachycardia runs occurred, the longest lasting 14 beats. Isolated SVEs were rare (<1.0%), SVE Couplets were rare (<1.0%), and SVE Triplets were rare (<1.0%). Isolated VEs were rare (<1.0%), and no VE Couplets or VE Triplets were present.       ______________________________________________________________________________________________      Risk Assessment/Calculations           Physical Exam VS:  BP 122/70 (BP Location: Left Arm, Patient Position: Sitting, Cuff Size: Normal)   Pulse 73   Ht 5' 8 (1.727 m)   Wt 169 lb (76.7 kg)   SpO2 97%   BMI 25.70 kg/m        Wt Readings from Last 3 Encounters:  02/19/24 169 lb (76.7 kg)  02/12/24 169 lb (76.7 kg)  01/31/24 160 lb (72.6 kg)    GEN: Well nourished, well developed in no acute distress.  Sitting comfortably on the exam table NECK: No JVD CARDIAC: RRR, no murmurs, rubs, gallops.  Radial  pulses 2+ bilaterally.  Left radial cath site is soft, nontender.  Healed RESPIRATORY:  Clear to auscultation without rales, wheezing or rhonchi.  Normal breathing on room air ABDOMEN: Soft, non-tender, non-distended EXTREMITIES:  No edema in BLE; No deformity   ASSESSMENT AND PLAN  Recent NSTEMI CAD status post CABG '07 (LIMA to LAD, free RIMA to OM, SVG to PDA) - Admitted on 12/12 with chest pain similar to previous angina.  High-sensitivity troponin 484>> 2458, EKG with nonspecific changes - Cardiac catheterization with patent LIMA to LAD, free RIMA to OM and SVG to PDA. Underwent successful PCI/DES to RPDA lesion  - Patient tells me that he has been doing very well since his admission.  Has been able to start exercising again and is tolerating this well.  Denies chest pain or dyspnea on exertion. -EKG today nonischemic -Left radial cath site has healed as expected. - Continue DAPT with aspirin /Brilinta  for at least 1 year.  Denies bruising or bleeding - Crestor  20 mg daily - Continue metoprolol  succinate 75 mg daily and amlodipine  2.5 mg daily   Hypertension - BP well controlled. No symptoms concerning for orthostatic hypotension  - Continue amlodipine  2.5 mg daily, Toprol  XL 75 mg daily   Hyperlipidemia - Lipid panel from 10/2023 showed LDL 57, HDL 44, triglycerides 97, total cholesterol 119 - Continue Crestor  20 mg daily  PAF  PVCs/PACs  - Patient has remote history of atrial fibrillation at the time of CABG.  Has not had recurrence since. Not on anticoagulation  -Patient denies palpitations - Continue metoprolol   succinate 75 mg daily     Cardiac Rehabilitation Eligibility Assessment  The patient is ready to start cardiac rehabilitation from a cardiac standpoint.     Dispo: Follow-up with Dr. Verlin as scheduled in 03/2024.  Offered to push appointment back as patient is doing well post cath, but patient preferred to keep appointment as scheduled  Signed, Rollo FABIENE Louder, PA-C   "

## 2024-02-05 NOTE — Telephone Encounter (Signed)
 Called patient to see if he was interested in cardiac rehab.  Pt stated that he was interested.

## 2024-02-10 ENCOUNTER — Telehealth: Payer: Self-pay | Admitting: Cardiovascular Disease

## 2024-02-10 NOTE — Telephone Encounter (Signed)
 Pt requesting a c/b to discuss his stent placement.

## 2024-02-10 NOTE — Telephone Encounter (Signed)
 I spoke with patient. He contacted scheduling and was told there were no upcoming appointments with Dr Noah available.  He would like to schedule appointment with Dr Noah after his appointment on 12/31.   Recent my chart message reviewed with patient and patient made aware of the following message from Dr Noah--   Noah Lonni BIRCH, MD to Me 02/10/24 10:38 AM Agree. Keep f/u as scheduled. His blood pressure readings are overall ok but all of this can be discussed in a normal follow up appointment. Noah [name]  Patient reports cath site has a small scab but otherwise is healing fine.  He states EMT had to stick him 6 times to place IV.  He has pain and bruising at IV site.  I asked him to contact PCP or go to Urgent Care to have this evaluated.    Patient will keep appointment with Noah Louder, Noah [name] on 12/31.  Appointment made for patient to see Dr Noah on 03/23/24

## 2024-02-11 NOTE — Telephone Encounter (Signed)
 this patient is calling again concerning his IV site. Pt is very anxious about his IV site possibly having cellulitis. I confirmed that it is a IV site that is the issue and not the cath insertion site. Pt reports that he had a some amoxicillin at his house that he started taking thinking that it would help. Pt advised to hold on taking the amoxicillin until evaluated by provider. Pt has tried getting in with PCP but was not successful and has asked to be worked in here. Spoke with Dr. Sheena and she will see the patient tomorrow at 0800.

## 2024-02-11 NOTE — Telephone Encounter (Signed)
 Pt calling again in regard to this issue. The IV site is still swollen and bruised and he would like to know if anyone can see him today. Please advise.

## 2024-02-12 ENCOUNTER — Encounter: Payer: Self-pay | Admitting: Cardiology

## 2024-02-12 ENCOUNTER — Ambulatory Visit: Attending: Cardiology | Admitting: Cardiology

## 2024-02-12 VITALS — BP 106/66 | HR 56 | Ht 68.0 in | Wt 169.0 lb

## 2024-02-12 DIAGNOSIS — I2510 Atherosclerotic heart disease of native coronary artery without angina pectoris: Secondary | ICD-10-CM

## 2024-02-12 DIAGNOSIS — I10 Essential (primary) hypertension: Secondary | ICD-10-CM | POA: Diagnosis not present

## 2024-02-12 DIAGNOSIS — S40022D Contusion of left upper arm, subsequent encounter: Secondary | ICD-10-CM

## 2024-02-12 DIAGNOSIS — S40022A Contusion of left upper arm, initial encounter: Secondary | ICD-10-CM

## 2024-02-12 DIAGNOSIS — E782 Mixed hyperlipidemia: Secondary | ICD-10-CM | POA: Diagnosis not present

## 2024-02-12 DIAGNOSIS — Z79899 Other long term (current) drug therapy: Secondary | ICD-10-CM | POA: Diagnosis not present

## 2024-02-12 LAB — CBC
Hematocrit: 48.3 % (ref 37.5–51.0)
Hemoglobin: 15.4 g/dL (ref 13.0–17.7)
MCH: 30.2 pg (ref 26.6–33.0)
MCHC: 31.9 g/dL (ref 31.5–35.7)
MCV: 95 fL (ref 79–97)
Platelets: 235 x10E3/uL (ref 150–450)
RBC: 5.1 x10E6/uL (ref 4.14–5.80)
RDW: 13.8 % (ref 11.6–15.4)
WBC: 6.8 x10E3/uL (ref 3.4–10.8)

## 2024-02-12 NOTE — Progress Notes (Signed)
 " Cardiology Office Note:    Date:  02/12/2024   ID:  [name], DOB [birth year], MRN [id number]  PCP:  Okey Carlin Redbird, MD  Cardiologist:  Lonni Cash, MD  Electrophysiologist:  None   Referring MD: Okey Carlin Redbird, MD    I am doing ok  History of Present Illness:    Noah [name] is a 73 y.o. male with a hx of coronary artery disease status post CABG with LIMA to the LAD, RIMA to OM 2, SVG to RPDA and recent PCI on December 12 with drug-eluting stent to the RPDA.  He follows with Dr. Cash and was last seen by Glendia Ferrier, PA during that visit his blood pressure was elevated amlodipine  was added.  In the interim he was admitted for NSTEMI and underwent a left heart catheterization.  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  Past Medical History:  Diagnosis Date   Allergy    seasonal- dust mites , tree pollen - worse in Spring    Anxiety    Arthritis    Coronary artery disease 07/13/2005   CABG   DDD (degenerative disc disease)    DJD (degenerative joint disease)    Elevated liver enzymes    GERD (gastroesophageal reflux disease)    Hemangioma of liver    Hemorrhoids    Hyperlipemia    Hypertension    PAC (premature atrial contraction)    Paroxysmal atrial fibrillation (HCC)    1 run of A Fib only- after heart surgery    Pneumonia    PVC (premature ventricular contraction)     Past Surgical History:  Procedure Laterality Date   COLONOSCOPY  05/2020   CORONARY ARTERY BYPASS GRAFT  07/13/2005   CORONARY STENT INTERVENTION N/A 01/31/2024   Procedure: CORONARY STENT INTERVENTION;  Surgeon: Elmira Newman JINNY, MD;  Location: [facility];  Service: Cardiovascular;  Laterality: N/A;   INGUINAL HERNIA REPAIR Right 1978, 2000   x 2   KNEE  SURGERY Right    LEFT HEART CATH AND CORS/GRAFTS ANGIOGRAPHY N/A 01/31/2024   Procedure: LEFT HEART CATH AND CORS/GRAFTS ANGIOGRAPHY;  Surgeon: Elmira Newman JINNY, MD;  Location: [facility];  Service: Cardiovascular;  Laterality: N/A;   SEPTOPLASTY     TRIGGER FINGER RELEASE Left 12/10/2023   Procedure: LEFT THUMB A1 PULLEY RELEASE, FOR TRIGGER FINGER;  Surgeon: Josefina Chew, MD;  Location: [facility];  Service: Orthopedics;  Laterality: Left;    Current Medications: Active Medications[1]   Allergies:   Cephalexin, Levofloxacin, Zoster vac recomb adjuvanted, Clarithromycin, Penicillins, Esomeprazole , Fluticasone, Other, Ciprofloxacin, Doxycycline, and Lipitor [atorvastatin]   Social History   Socioeconomic History   Marital status: Married    Spouse name: Not on file   Number of children: 1   Years of education: Not on file   Highest education level: Not on file  Occupational History   Occupation: retired    Associate Professor: RETIRED  Tobacco Use   Smoking status: Former    Current packs/day: 0.00    Types: Cigarettes    Quit date: 02/19/1974    Years since quitting: 50.0   Smokeless tobacco: Never   Tobacco comments:    stopped 32 yrs ago  Vaping Use   Vaping  status: Never Used  Substance and Sexual Activity   Alcohol use: Yes    Comment: rare   Drug use: No   Sexual activity: Not on file  Other Topics Concern   Not on file  Social History Narrative   Pt lives in [place] with spouse.  Son is grown.  Retired from [employer].     patient rides bicycle 7 to 8  Mile a day in a 1/2hr.   Social Drivers of Health   Tobacco Use: Medium Risk (02/12/2024)   Patient History    Smoking Tobacco Use: Former    Smokeless Tobacco Use: Never    Passive Exposure: Not on Actuary Strain: Not on file  Food Insecurity: Not on file  Transportation Needs: Not on file  Physical Activity: Not on file  Stress: Not on file  Social Connections: Unknown  (07/04/2021)   Received from [facility]   Social Network    Social Network: Not on file  Depression (PHQ2-9): Not on file  Alcohol Screen: Not on file  Housing: Not on file  Utilities: Not on file  Health Literacy: Not on file     Family History: The patient's family history includes Atrial fibrillation (age of onset: 36) in his mother; Colon polyps in his mother; Coronary artery disease in his father; Heart attack in his father, maternal grandfather, paternal grandfather, and paternal grandmother; Prostate cancer in his maternal uncle; Seizures in his brother. There is no history of Colon cancer, Esophageal cancer, Rectal cancer, or Stomach cancer.  ROS:   Review of Systems  Constitution: Negative for decreased appetite, fever and weight gain.  HENT: Negative for congestion, ear discharge, hoarse voice and sore throat.   Eyes: Negative for discharge, redness, vision loss in right eye and visual halos.  Cardiovascular: Negative for chest pain, dyspnea on exertion, leg swelling, orthopnea and palpitations.  Respiratory: Negative for cough, hemoptysis, shortness of breath and snoring.   Endocrine: Negative for heat intolerance and polyphagia.  Hematologic/Lymphatic: Negative for bleeding problem. Does not bruise/bleed easily.  Skin: Negative for flushing, nail changes, rash and suspicious lesions.  Musculoskeletal: Negative for arthritis, joint pain, muscle cramps, myalgias, neck pain and stiffness.  Gastrointestinal: Negative for abdominal pain, bowel incontinence, diarrhea and excessive appetite.  Genitourinary: Negative for decreased libido, genital sores and incomplete emptying.  Neurological: Negative for brief paralysis, focal weakness, headaches and loss of balance.  Psychiatric/Behavioral: Negative for altered mental status, depression and suicidal ideas.  Allergic/Immunologic: Negative for HIV exposure and persistent infections.    EKGs/Labs/Other Studies Reviewed:    The  following studies were reviewed today:   EKG:  The ekg ordered today demonstrates   Recent Labs: 11/12/2023: ALT 27 01/31/2024: BUN 18; Creatinine, Ser 0.91; Hemoglobin 14.7; Magnesium  2.4; Platelets 212; Potassium 4.3; Sodium 137  Recent Lipid Panel    Component Value Date/Time   CHOL 119 11/12/2023 0927   CHOL 121 02/09/2016 1206   CHOL 124 07/28/2014 0903   TRIG 97 11/12/2023 0927   TRIG 108 01/23/2017 1030   TRIG 112 07/28/2014 0903   HDL 44 11/12/2023 0927   HDL 46 01/23/2017 1030   HDL 46 07/28/2014 0903   CHOLHDL 2.7 11/12/2023 0927   CHOLHDL 2.5 02/09/2016 1206   LDLCALC 57 11/12/2023 0927   LDLCALC 53 02/09/2016 1206   LDLCALC 56 07/28/2014 0903    Physical Exam:    VS:  BP 106/66   Pulse (!) 56   Ht 5' 8 (1.727 m)  Wt 169 lb (76.7 kg)   SpO2 97%   BMI 25.70 kg/m     Wt Readings from Last 3 Encounters:  02/12/24 169 lb (76.7 kg)  01/31/24 160 lb (72.6 kg)  12/23/23 167 lb (75.8 kg)     GEN: Well nourished, well developed in no acute distress HEENT: Normal NECK: No JVD; No carotid bruits LYMPHATICS: No lymphadenopathy CARDIAC: S1S2 noted,RRR, no murmurs, rubs, gallops RESPIRATORY:  Clear to auscultation without rales, wheezing or rhonchi  ABDOMEN: Soft, non-tender, non-distended, +bowel sounds, no guarding. EXTREMITIES: No edema, No cyanosis, no clubbing MUSCULOSKELETAL:  No deformity  SKIN: Warm and dry NEUROLOGIC:  Alert and oriented x 3, non-focal PSYCHIATRIC:  Normal affect, good insight  ASSESSMENT:    1. Medication management   2. Superficial bruising of arm, left, initial encounter   3. CAD in native artery   4. Mixed hyperlipidemia    PLAN:    Left forearm bruising - no clinical signs and symptoms of cellulitis. This is not cellulitis. I shared with patient, there is no need for abx, no infection. Bruising appreciated. No evidence that would suggest pseudoaneurysm either - no imaging indicated at this time.  CAD - no anginal  symptoms, continue current medication regimen.  HLN - continue current dose of statin.   HTN - blood pressure at target   The patient is in agreement with the above plan. The patient left the office in stable condition.  He has follow with with Rollo Louder, PA in the upcoming weeks.   Medication Adjustments/Labs and Tests Ordered: Current medicines are reviewed at length with the patient today.  Concerns regarding medicines are outlined above.  Orders Placed This Encounter  Procedures   CBC   No orders of the defined types were placed in this encounter.   Patient Instructions  Medication Instructions:  Your physician recommends that you continue on your current medications as directed. Please refer to the Current Medication list given to you today.  *If you need a refill on your cardiac medications before your next appointment, please call your pharmacy*  Lab Work: CBC If you have labs (blood work) drawn today and your tests are completely normal, you will receive your results only by: MyChart Message (if you have MyChart) OR A paper copy in the mail If you have any lab test that is abnormal or we need to change your treatment, we will call you to review the results.   Follow-Up: At [facility], you and your health needs are our priority.  As part of our continuing mission to provide you with exceptional heart care, our providers are all part of one team.  This team includes your primary Cardiologist (physician) and Advanced Practice Providers or APPs (Physician Assistants and Nurse Practitioners) who all work together to provide you with the care you need, when you need it.  Your next appointment:    12/31  Provider:   Rollo Louder, PA-C    Adopting a Healthy Lifestyle.  Know what a healthy weight is for you (roughly BMI <25) and aim to maintain this   Aim for 7+ servings of fruits and vegetables daily   65-80+ fluid ounces of water  or unsweet tea for  healthy kidneys   Limit to max 1 drink of alcohol per day; avoid smoking/tobacco   Limit animal fats in diet for cholesterol and heart health - choose grass fed whenever available   Avoid highly processed foods, and foods high in saturated/trans fats  Aim for low stress - take time to unwind and care for your mental health   Aim for 150 min of moderate intensity exercise weekly for heart health, and weights twice weekly for bone health   Aim for 7-9 hours of sleep daily   When it comes to diets, agreement about the perfect plan isnt easy to find, even among the experts. Experts at the [facility] of [employer] developed an idea known as the Healthy Eating Plate. Just imagine a plate divided into logical, healthy portions.   The emphasis is on diet quality:   Load up on vegetables and fruits - one-half of your plate: Aim for color and variety, and remember that potatoes dont count.   Go for whole grains - one-quarter of your plate: Whole wheat, barley, wheat berries, quinoa, oats, brown rice, and foods made with them. If you want pasta, go with whole wheat pasta.   Protein power - one-quarter of your plate: Fish, chicken, beans, and nuts are all healthy, versatile protein sources. Limit red meat.   The diet, however, does go beyond the plate, offering a few other suggestions.   Use healthy plant oils, such as olive, canola, soy, corn, sunflower and peanut. Check the labels, and avoid partially hydrogenated oil, which have unhealthy trans fats.   If youre thirsty, drink water . Coffee and tea are good in moderation, but skip sugary drinks and limit milk and dairy products to one or two daily servings.   The type of carbohydrate in the diet is more important than the amount. Some sources of carbohydrates, such as vegetables, fruits, whole grains, and beans-are healthier than others.   Finally, stay active  Signed, Demeisha Geraghty, DO  02/12/2024 6:16 PM    [facility]     [1]  Current Meds  Medication Sig   amLODipine  (NORVASC ) 2.5 MG tablet Take 1 tablet (2.5 mg total) by mouth daily.   aspirin  EC 81 MG tablet Take 1 tablet (81 mg total) by mouth daily.   calcium  carbonate (TUMS - DOSED IN MG ELEMENTAL CALCIUM ) 500 MG chewable tablet Chew 1 tablet by mouth daily as needed.   Cholecalciferol (VITAMIN D) 2000 UNITS tablet Take 2,000 Units by mouth daily.   Coenzyme Q10 100 MG capsule Take 100 mg by mouth daily.   famotidine  (PEPCID ) 20 MG tablet Take 20 mg by mouth 2 (two) times daily.   Flaxseed, Linseed, POWD Take 2 scoop by mouth as directed.   Ganciclovir (ZIRGAN) 0.15 % GEL 1 application. As needed for cold sores   hydrocortisone 2.5 % cream Apply 1 Application topically daily as needed (Apply topically as needed).   LORazepam  (ATIVAN ) 0.5 MG tablet Take 1 tablet by mouth at bedtime as needed for sleep. To help with sleep   metoprolol  succinate (TOPROL -XL) 25 MG 24 hr tablet Take 1 tablet (25 mg total) by mouth 2 (two) times daily.   Multiple Vitamin (MULTIVITAMIN WITH MINERALS) TABS Take 1 tablet by mouth daily. Centrum silver   nitroGLYCERIN  (NITROSTAT ) 0.4 MG SL tablet Place 1 tablet (0.4 mg total) under the tongue every 5 (five) minutes as needed for chest pain (3 DOSES MAX).   pramoxine-hydrocortisone (ANALPRAM HC) cream Apply 1 application topically at bedtime as needed (for skin).    pramoxine-hydrocortisone (PROCTOCREAM-HC) 1-1 % rectal cream Apply topically.   rosuvastatin  (CRESTOR ) 20 MG tablet TAKE 1 TABLET BY MOUTH DAILY   ticagrelor  (BRILINTA ) 90 MG TABS tablet Take 1 tablet (90 mg  total) by mouth 2 (two) times daily.   traMADol  (ULTRAM ) 50 MG tablet Take 1 tablet (50 mg total) by mouth every 6 (six) hours as needed for severe pain (pain score 7-10).   "

## 2024-02-12 NOTE — Patient Instructions (Signed)
 Medication Instructions:  Your physician recommends that you continue on your current medications as directed. Please refer to the Current Medication list given to you today.  *If you need a refill on your cardiac medications before your next appointment, please call your pharmacy*  Lab Work: CBC If you have labs (blood work) drawn today and your tests are completely normal, you will receive your results only by: MyChart Message (if you have MyChart) OR A paper copy in the mail If you have any lab test that is abnormal or we need to change your treatment, we will call you to review the results.   Follow-Up: At [facility], you and your health needs are our priority.  As part of our continuing mission to provide you with exceptional heart care, our providers are all part of one team.  This team includes your primary Cardiologist (physician) and Advanced Practice Providers or APPs (Physician Assistants and Nurse Practitioners) who all work together to provide you with the care you need, when you need it.  Your next appointment:    12/31  Provider:   Rollo Louder, PA-C

## 2024-02-13 ENCOUNTER — Ambulatory Visit: Payer: Self-pay | Admitting: Cardiology

## 2024-02-17 ENCOUNTER — Telehealth: Payer: Self-pay | Admitting: Cardiovascular Disease

## 2024-02-17 MED ORDER — METOPROLOL SUCCINATE ER 25 MG PO TB24: 37.5000 mg | ORAL_TABLET | Freq: Two times a day (BID) | ORAL | 1 refills | Status: AC

## 2024-02-17 NOTE — Telephone Encounter (Signed)
 Medication sent 01/31/24. Patient aware.

## 2024-02-17 NOTE — Telephone Encounter (Signed)
" °*  STAT* If patient is at the pharmacy, call can be transferred to refill team.   1. Which medications need to be refilled? (please list name of each medication and dose if known)   ticagrelor  (BRILINTA ) 90 MG TABS tablet     2. Would you like to learn more about the convenience, safety, & potential cost savings by using the [facility]? No      3. Are you open to using the [facility] (Type [facility]. ). No    4. Which pharmacy/location (including street and city if local pharmacy) is medication to be sent to?  [id number] - [place], [place] - [address]    5. Do they need a 30 day or 90 day supply? 90 days       "

## 2024-02-17 NOTE — Telephone Encounter (Signed)
" °  Pt c/o medication issue:  1. Name of Medication: metoprolol  succinate (TOPROL -XL) 25 MG 24 hr tablet   2. How are you currently taking this medication (dosage and times per day)?   Take 1 tablet (25 mg total) by mouth 2 (two) times daily.    3. Are you having a reaction (difficulty breathing--STAT)? No   4. What is your medication issue? Patient said Dr. Verlin changed this medication to take 3 times a day. [facility]  "

## 2024-02-17 NOTE — Addendum Note (Signed)
 Addended by: LORING ANDRIETTE HERO on: 02/17/2024 12:16 PM   Modules accepted: Orders

## 2024-02-17 NOTE — Telephone Encounter (Signed)
Responded in another MyChart encounter.

## 2024-02-19 ENCOUNTER — Encounter: Payer: Self-pay | Admitting: Cardiology

## 2024-02-19 ENCOUNTER — Ambulatory Visit: Attending: Cardiology | Admitting: Cardiology

## 2024-02-19 VITALS — BP 122/70 | HR 73 | Ht 68.0 in | Wt 169.0 lb

## 2024-02-19 DIAGNOSIS — I491 Atrial premature depolarization: Secondary | ICD-10-CM

## 2024-02-19 DIAGNOSIS — Z955 Presence of coronary angioplasty implant and graft: Secondary | ICD-10-CM | POA: Diagnosis not present

## 2024-02-19 DIAGNOSIS — I2510 Atherosclerotic heart disease of native coronary artery without angina pectoris: Secondary | ICD-10-CM | POA: Diagnosis not present

## 2024-02-19 DIAGNOSIS — I10 Essential (primary) hypertension: Secondary | ICD-10-CM | POA: Diagnosis not present

## 2024-02-19 DIAGNOSIS — E782 Mixed hyperlipidemia: Secondary | ICD-10-CM

## 2024-02-19 DIAGNOSIS — I493 Ventricular premature depolarization: Secondary | ICD-10-CM | POA: Diagnosis not present

## 2024-02-19 DIAGNOSIS — Z951 Presence of aortocoronary bypass graft: Secondary | ICD-10-CM

## 2024-02-19 NOTE — Patient Instructions (Signed)
 Medication Instructions:  Your physician recommends that you continue on your current medications as directed. Please refer to the Current Medication list given to you today.  *If you need a refill on your cardiac medications before your next appointment, please call your pharmacy*   Follow-Up: At [facility], you and your health needs are our priority.  As part of our continuing mission to provide you with exceptional heart care, our providers are all part of one team.  This team includes your primary Cardiologist (physician) and Advanced Practice Providers or APPs (Physician Assistants and Nurse Practitioners) who all work together to provide you with the care you need, when you need it.  Your next appointment:   March 23, 2024  Provider:   Lonni Cash, MD

## 2024-02-28 ENCOUNTER — Encounter: Payer: Self-pay | Admitting: Cardiovascular Disease

## 2024-03-03 ENCOUNTER — Telehealth: Payer: Self-pay | Admitting: Cardiovascular Disease

## 2024-03-03 NOTE — Telephone Encounter (Signed)
 Pt called in and stated he played golf on 1/7 and he has discoloration on both sheens.  Pt denied's any other symptoms.   Best number [phone]

## 2024-03-04 ENCOUNTER — Encounter: Payer: Self-pay | Admitting: Cardiology

## 2024-03-04 ENCOUNTER — Ambulatory Visit: Attending: Cardiology | Admitting: Cardiology

## 2024-03-04 ENCOUNTER — Ambulatory Visit: Admitting: Cardiology

## 2024-03-04 VITALS — BP 138/76 | HR 68 | Ht 68.0 in | Wt 173.2 lb

## 2024-03-04 DIAGNOSIS — Z955 Presence of coronary angioplasty implant and graft: Secondary | ICD-10-CM

## 2024-03-04 DIAGNOSIS — E782 Mixed hyperlipidemia: Secondary | ICD-10-CM

## 2024-03-04 DIAGNOSIS — Z951 Presence of aortocoronary bypass graft: Secondary | ICD-10-CM | POA: Diagnosis not present

## 2024-03-04 DIAGNOSIS — T148XXA Other injury of unspecified body region, initial encounter: Secondary | ICD-10-CM

## 2024-03-04 DIAGNOSIS — I493 Ventricular premature depolarization: Secondary | ICD-10-CM | POA: Diagnosis not present

## 2024-03-04 DIAGNOSIS — I2510 Atherosclerotic heart disease of native coronary artery without angina pectoris: Secondary | ICD-10-CM | POA: Diagnosis not present

## 2024-03-04 DIAGNOSIS — T148XXD Other injury of unspecified body region, subsequent encounter: Secondary | ICD-10-CM

## 2024-03-04 DIAGNOSIS — I10 Essential (primary) hypertension: Secondary | ICD-10-CM | POA: Diagnosis not present

## 2024-03-04 NOTE — Telephone Encounter (Signed)
 Epic encountered an error and closed before last note was written. Pt wants to be seen ASAP for issue. States he has increased bruising on his shin bone. Feels there are lumps forming on leg as well. States he was not seen and was told he was ok before he had a heart attack in December. He stated he feels like that was avoidable. Set appt with Katlyn West on 03/04/24 that needed to be changed due to double booking. Pt was called back and stated he would take the earlier appointment on 03/04/24.

## 2024-03-04 NOTE — Patient Instructions (Signed)
 Medication Instructions:  Your physician recommends that you continue on your current medications as directed. Please refer to the Current Medication list given to you today.  *If you need a refill on your cardiac medications before your next appointment, please call your pharmacy*  Follow-Up:  Keep upcoming appointments

## 2024-03-04 NOTE — Telephone Encounter (Deleted)
 Increased bruising on left side on sheen bone. Feels other lumps and stuff on that leg.

## 2024-03-04 NOTE — Progress Notes (Signed)
 "  Cardiology Office Note    Date:  03/04/2024  ID:  Noah [name], DOB [birth year], MRN [id number] PCP:  Okey Carlin Redbird, MD  Cardiologist:  Lonni Cash, MD  Electrophysiologist:  None   Chief Complaint: Bruising   History of Present Illness: .    Noah [name] is a 74 y.o. male with visit-pertinent history of CAD s/p CABG in 2007, remote paroxysmal atrial fibrillation, hypertension, hyperlipidemia, carotid artery disease, aortic atherosclerosis, right bundle branch block, PVCs and SVT.  Patient was admitted in 01/2024 with NSTEMI, presented with chest pain that felt like burning.  Progressed and felt more pressure-like.  High sensitive troponin peaked at 2458.  Patient underwent cardiac cath on 01/31/2024 that showed mid RPDA 90% focal stenosis distal bypass graft insertion.  This was treated with DES.  LIMA to LAD, RIMA to OM 2 and SVG to PDA were patent.  Echo during admission showed EF 66 5% with wall motion abnormalities, G1 DD, mildly reduced RV systolic function.  Patient was recently seen in clinic on 02/19/2024.  Patient had remained stable from cardiac standpoint.  Patient reported some bruising in his left arm that had resolved.  Today patient presents regarding a lump on the shin the of the right leg.  Patient notes that he went golfing last week following had increased bruising to the lateral aspect of the right leg, he noted that in the following days he developed a lump on his right shin and noted increased bruising to the front of his shin.  Patient reports that it is not overly painful just slightly sensitive to touch.  He denies any change with walking or movement of his foot, patient reports that he does not believe he has a blood clot.  Patient is extremely active regularly biking and walking.  He denies any chest pain, shortness of breath, lower extremity edema, orthopnea or PND.  Denies any palpitations, presyncope or syncope.  Patient notes that little  over a week ago he did have some slight shortness of breath after taking his Brilinta , has since resolved. ROS: .   Today he denies chest pain, lower extremity edema, fatigue, palpitations, melena, hematuria, hemoptysis, diaphoresis, weakness, presyncope, syncope, orthopnea, and PND.  All other systems are reviewed and otherwise negative. Studies Reviewed: SABRA   EKG:  EKG is not ordered today.     CV Studies: Cardiac studies reviewed are outlined and summarized above. Otherwise please see EMR for full report. Cardiac Studies & Procedures   ______________________________________________________________________________________________ CARDIAC CATHETERIZATION  CARDIAC CATHETERIZATION 01/31/2024  Conclusion Images from the original result were not included. Coronary & bypass graft angiography/intervention 01/31/2024: LM: Mid to distal 80% stenosis LAD: Ostial occlusion, bypassed by patent LIMA-LAD Lcx: Proximal 70% disease, bypassed by patent free RIMA-OM 2 RCA: Proximal/mid occlusion, bypassed by patent SVG-RPDA Mid RPDA 90% focal stenosis distal to bypass graft insertion LIMA-LAD: No significant disease RIMA-OM 2: No significant disease SVG/PDA: No significant disease  LVEDP 1 mmHg  Successful percutaneous coronary intervention RPDA PTCA and stent placement 2.5 X 16 mm Synergy drug-eluting stent Postdilatation using 2.5 x 12 mm Sapphire Reserve balloon up to 14 atm     Okay to discharge home tonight after 6-hour monitoring.  Newman Noah Lawrence, MD  Findings Coronary Findings Diagnostic  Dominance: Right  Left Main Mid LM to Ost LAD lesion is 80% stenosed.  Left Anterior Descending Ost LAD to Prox LAD lesion is 100% stenosed.  Left Anterior Descending Ost LAD to Prox LAD lesion is  100% stenosed.  Left Circumflex Ost Cx to Prox Cx lesion is 70% stenosed.  Right Coronary Artery Prox RCA to Mid RCA lesion is 100% stenosed.  Right Posterior Descending Artery RPDA lesion  is 90% stenosed.  Graft To RPDA And is normal in caliber.  The graft exhibits no disease.  LIMA Graft To Mid LAD  RIMA Graft To 2nd Mrg RIMA.  Intervention  RPDA lesion Stent CATH VISTA GUIDE 6FR MPA1 MLPK guide catheter was inserted. Lesion crossed with guidewire using a WIRE RUNTHROUGH .[id number]. Pre-stent angioplasty was performed using a BALLOON EMERGE MR 2.0X12. Maximum pressure:  8 atm. Inflation time:  15 sec.  A second angioplasty balloon was used, using a BALLOON EMERGE MR 2.5X8. Maximum pressure:  8 atm. Inflation time:  15 sec. A drug-eluting stent was successfully placed using a STENT SYNERGY XD 2.50X16. Maximum pressure: 12 atm. Inflation time: 30 sec. Stent strut is well apposed. Post-stent angioplasty was performed using a BALLOON SAPPHIRE NC24 [id number]. Maximum pressure:  14 atm. Inflation time:  20 sec. Post-Intervention Lesion Assessment The intervention was successful. Pre-interventional TIMI flow is 3. Post-intervention TIMI flow is 3. No complications occurred at this lesion. There is a 0% residual stenosis post intervention.   STRESS TESTS  MYOCARDIAL PERFUSION IMAGING 04/27/2020  Interpretation Summary  Nuclear stress EF: 63%. The left ventricular ejection fraction is normal (55-65%).  There was no ST segment deviation noted during stress.  This is a low risk study. There is no evidence of ischemia and no evidence of previous infarction.  The study is normal.   ECHOCARDIOGRAM  ECHOCARDIOGRAM COMPLETE 01/31/2024  Narrative ECHOCARDIOGRAM REPORT    Patient Name:   Noah [name] [facility] Date of Exam: 01/31/2024 Medical Rec #:  [id number]          Height:       68.0 in Accession #:    [id number]         Weight:       167.0 lb Date of Birth:  [birth year]         BSA:          1.893 m Patient Age:    74 years           BP:           147/74 mmHg Patient Gender: M                  HR:           74 bpm. Exam Location:  Inpatient  Procedure: 2D Echo, Color  Doppler, Cardiac Doppler and Intracardiac Opacification Agent (Both Spectral and Color Flow Doppler were utilized during procedure).  Indications:    NSTEMI I21.4  History:        Patient has prior history of Echocardiogram examinations, most recent 10/12/2022.  Sonographer:    Tinnie Gosling RDCS Referring Phys: [id number] MICHAEL COSIANO  IMPRESSIONS   1. Left ventricular ejection fraction, by estimation, is 60 to 65%. The left ventricle has normal function. The left ventricle demonstrates regional wall motion abnormalities with inferior hypokinesis. Left ventricular diastolic parameters are consistent with Grade I diastolic dysfunction (impaired relaxation). 2. Right ventricular systolic function is mildly reduced. The right ventricular size is normal. Tricuspid regurgitation signal is inadequate for assessing PA pressure. 3. The mitral valve is degenerative. Trivial mitral valve regurgitation. No evidence of mitral stenosis. Moderate mitral annular calcification. 4. The aortic valve is tricuspid. There is moderate calcification of the aortic valve. Aortic valve regurgitation is not  visualized. Aortic valve sclerosis/calcification is present, without any evidence of aortic stenosis. 5. IVC not visualized.  FINDINGS Left Ventricle: Left ventricular ejection fraction, by estimation, is 60 to 65%. The left ventricle has normal function. The left ventricle demonstrates regional wall motion abnormalities. The left ventricular internal cavity size was normal in size. There is no left ventricular hypertrophy. Left ventricular diastolic parameters are consistent with Grade I diastolic dysfunction (impaired relaxation).  Right Ventricle: The right ventricular size is normal. No increase in right ventricular wall thickness. Right ventricular systolic function is mildly reduced. Tricuspid regurgitation signal is inadequate for assessing PA pressure.  Left Atrium: Left atrial size was normal in  size.  Right Atrium: Right atrial size was normal in size.  Pericardium: There is no evidence of pericardial effusion.  Mitral Valve: The mitral valve is degenerative in appearance. There is moderate calcification of the mitral valve leaflet(s). Moderate mitral annular calcification. Trivial mitral valve regurgitation. No evidence of mitral valve stenosis.  Tricuspid Valve: The tricuspid valve is normal in structure. Tricuspid valve regurgitation is not demonstrated.  Aortic Valve: The aortic valve is tricuspid. There is moderate calcification of the aortic valve. Aortic valve regurgitation is not visualized. Aortic valve sclerosis/calcification is present, without any evidence of aortic stenosis.  Pulmonic Valve: The pulmonic valve was normal in structure. Pulmonic valve regurgitation is not visualized.  Aorta: The aortic root is normal in size and structure.  Venous: IVC not visualized.  IAS/Shunts: No atrial level shunt detected by color flow Doppler.   LEFT VENTRICLE PLAX 2D LVIDd:         4.80 cm      Diastology LVIDs:         2.90 cm      LV e' medial:    6.74 cm/s LV PW:         0.90 cm      LV E/e' medial:  13.3 LV IVS:        0.90 cm      LV e' lateral:   13.30 cm/s LVOT diam:     1.70 cm      LV E/e' lateral: 6.7 LV SV:         67 LV SV Index:   35 LVOT Area:     2.27 cm  LV Volumes (MOD) LV vol d, MOD A2C: 119.0 ml LV vol d, MOD A4C: 124.0 ml LV vol s, MOD A2C: 26.6 ml LV vol s, MOD A4C: 29.6 ml LV SV MOD A2C:     92.4 ml LV SV MOD A4C:     124.0 ml LV SV MOD BP:      93.8 ml  RIGHT VENTRICLE RV S prime:     11.90 cm/s  PULMONARY VEINS TAPSE (M-mode): 1.3 cm      Diastolic Velocity: 50.60 cm/s S/D Velocity:       0.90 Systolic Velocity:  45.20 cm/s  LEFT ATRIUM             Index        RIGHT ATRIUM           Index LA diam:        3.80 cm 2.01 cm/m   RA Area:     13.40 cm LA Vol (A2C):   44.2 ml 23.35 ml/m  RA Volume:   30.20 ml  15.95 ml/m LA Vol  (A4C):   40.4 ml 21.34 ml/m LA Biplane Vol: 44.1 ml 23.30 ml/m AORTIC VALVE LVOT Vmax:  140.00 cm/s LVOT Vmean:  101.000 cm/s LVOT VTI:    0.295 m  AORTA Ao Root diam: 3.10 cm Ao Asc diam:  2.70 cm  MITRAL VALVE MV Area (PHT): 3.72 cm    SHUNTS MV Decel Time: 204 msec    Systemic VTI:  0.30 m MV E velocity: 89.60 cm/s  Systemic Diam: 1.70 cm MV A velocity: 89.60 cm/s MV E/A ratio:  1.00  Dalton McleanMD Electronically signed by Ezra Kanner Signature Date/Time: 01/31/2024/3:17:23 PM    Final    MONITORS  LONG TERM MONITOR (3-14 DAYS) 05/02/2023  Narrative Patch Wear Time:  3 days and 15 hours (2025-03-04T12:57:38-0500 to 2025-03-08T04:10:09-498)  Sinus rhythm. (min HR of 46 bpm, max HR of 171 bpm, and avg HR of 65 bpm). 13 Supraventricular Tachycardia runs occurred, the longest lasting 14 beats. Isolated SVEs were rare (<1.0%), SVE Couplets were rare (<1.0%), and SVE Triplets were rare (<1.0%). Isolated VEs were rare (<1.0%), and no VE Couplets or VE Triplets were present.       ______________________________________________________________________________________________       Current Reported Medications:.    Active Medications[1]  Physical Exam:    VS:  BP 138/76   Pulse 68   Ht 5' 8 (1.727 m)   Wt 173 lb 3.2 oz (78.6 kg)   SpO2 99%   BMI 26.33 kg/m    Wt Readings from Last 3 Encounters:  03/04/24 173 lb 3.2 oz (78.6 kg)  02/19/24 169 lb (76.7 kg)  02/12/24 169 lb (76.7 kg)    GEN: Well nourished, well developed in no acute distress NECK: No JVD; No carotid bruits CARDIAC: RRR, no murmurs, rubs, gallops RESPIRATORY:  Clear to auscultation without rales, wheezing or rhonchi  ABDOMEN: Soft, non-tender, non-distended EXTREMITIES:  No edema; No acute deformity     Asessement and Plan:.    Bruising: Patient notes that last week after golfing he noticed increased bruising to the lateral aspect of the right leg, has since moved to his shin.   He also has noted a lump on his right shin with bruising beneath the area.  Patient notes the area is not significantly painful just sensitive to touch.  Low suspicion for blood clot given location and that patient is extremely active regularly biking and walking.  Patient's area of bruising is healing, there is no significant swelling.  Patient has strong pulses in the feet with good capillary refill.  Recommended that patient continue monitoring, if he has not noted improvement would recommend following up with PCP.  CAD: S/p CABG with LIMA to LAD, free RIMA to OM, SVG to PDA in 2007.  Patient admitted in 01/2024 with chest pain similar to previous angina.  Cardiac catheterization with patent LIMA to LAD, free RIMA to OM and SVG to PDA.  Patient underwent successful PCI/DES to RPDA lesion. Stable with no anginal symptoms. No indication for ischemic evaluation.  Heart healthy diet and regular cardiovascular exercise encouraged.  Continue DAPT with aspirin  and Brilinta  for at least 1 year.  Hypertension: Blood pressure today 138/76, patient notes that he was in a rush to arrive here today.  He reports that his blood pressure is typically very well-controlled at home.  Encouraged patient to continue monitoring.  Continue current antihypertensive regimen.  Hyperlipidemia: Lipid panel from 9/25 showed LDL 57, HDL 44, triglycerides 97 and total cholesterol 119.  Continue Crestor  20 mg daily.  PAF/PVCs/PACs: Patient has remote history of atrial fibrillation at time of CABG.  Has not had recurrence  since.  Patient is on and anticoagulation.  He denies palpitations.  Continue metoprolol  succinate 75 mg daily.    Cardiac Rehabilitation Eligibility Assessment  The patient is ready to start cardiac rehabilitation from a cardiac standpoint.    Disposition: F/u with Dr. Verlin in 03/2024 per patient.   Signed, Milyn Stapleton D Keary Waterson, NP       [1]  Current Meds  Medication Sig   amLODipine  (NORVASC ) 2.5 MG  tablet Take 1 tablet (2.5 mg total) by mouth daily.   aspirin  EC 81 MG tablet Take 1 tablet (81 mg total) by mouth daily.   calcium  carbonate (TUMS - DOSED IN MG ELEMENTAL CALCIUM ) 500 MG chewable tablet Chew 1 tablet by mouth daily as needed.   Cholecalciferol (VITAMIN D) 2000 UNITS tablet Take 2,000 Units by mouth daily.   Coenzyme Q10 100 MG capsule Take 100 mg by mouth daily.   famotidine  (PEPCID ) 20 MG tablet Take 20 mg by mouth 2 (two) times daily.   Flaxseed, Linseed, POWD Take 2 scoop by mouth as directed.   Ganciclovir (ZIRGAN) 0.15 % GEL 1 application. As needed for cold sores   hydrocortisone 2.5 % cream Apply 1 Application topically daily as needed (Apply topically as needed).   LORazepam  (ATIVAN ) 0.5 MG tablet Take 1 tablet by mouth at bedtime as needed for sleep. To help with sleep   metoprolol  succinate (TOPROL -XL) 25 MG 24 hr tablet Take 1.5 tablets (37.5 mg total) by mouth 2 (two) times daily.   Multiple Vitamin (MULTIVITAMIN WITH MINERALS) TABS Take 1 tablet by mouth daily. Centrum silver   nitroGLYCERIN  (NITROSTAT ) 0.4 MG SL tablet Place 1 tablet (0.4 mg total) under the tongue every 5 (five) minutes as needed for chest pain (3 DOSES MAX).   pramoxine-hydrocortisone (ANALPRAM HC) cream Apply 1 application topically at bedtime as needed (for skin).    pramoxine-hydrocortisone (PROCTOCREAM-HC) 1-1 % rectal cream Apply topically.   rosuvastatin  (CRESTOR ) 20 MG tablet TAKE 1 TABLET BY MOUTH DAILY   ticagrelor  (BRILINTA ) 90 MG TABS tablet Take 1 tablet (90 mg total) by mouth 2 (two) times daily.   traMADol  (ULTRAM ) 50 MG tablet Take 1 tablet (50 mg total) by mouth every 6 (six) hours as needed for severe pain (pain score 7-10).   "

## 2024-03-22 ENCOUNTER — Encounter: Payer: Self-pay | Admitting: Cardiovascular Disease

## 2024-03-23 ENCOUNTER — Encounter: Payer: Self-pay | Admitting: Cardiovascular Disease

## 2024-03-23 ENCOUNTER — Ambulatory Visit: Admitting: Cardiovascular Disease

## 2024-03-23 VITALS — BP 120/60 | HR 88 | Ht 68.0 in | Wt 170.2 lb

## 2024-03-23 DIAGNOSIS — E782 Mixed hyperlipidemia: Secondary | ICD-10-CM | POA: Diagnosis not present

## 2024-03-23 DIAGNOSIS — I471 Supraventricular tachycardia, unspecified: Secondary | ICD-10-CM | POA: Diagnosis not present

## 2024-03-23 DIAGNOSIS — I2510 Atherosclerotic heart disease of native coronary artery without angina pectoris: Secondary | ICD-10-CM

## 2024-03-23 DIAGNOSIS — I10 Essential (primary) hypertension: Secondary | ICD-10-CM | POA: Diagnosis not present

## 2024-03-23 NOTE — Progress Notes (Signed)
 "    Chief Complaint  Patient presents with   Follow-up    CAD   History of Present Illness: 74 yo male with history of anxiety, CAD s/p 3V CABG, GERD, RBBB, HTN, hyperlipidemia, PAF, PACs, PVCs here today for follow up. He has been followed by Dr. Dann. He had 3V CABG in 2007 (LIMA to LAD, free RIMA to OM, SVG to PDA). Nuclear stress test in March 2022 with no ischemia. Cardiac monitor in December 2023 with sinus, PACs, PVCs, brief runs of SVT, no atrial fibrillation. Carotid artery dopplers in March 2024 with no significant disease. Echo August 2024 with LVEF=60-65%. No significant valve disease. I saw him in October 2024 and he was doing well. New RBBB on EKG in October 2024. He called into our office February 2025 and reported dizziness which occurred after eating dark chocolate. 3 day cardiac monitor March 2025 with sinus, several runs of SVT, longest 14 beats. His diary of symptoms correlated with PVCs and PACs but not with the SVT.  Toprol  was increased to 25 mg BID.  He was admitted to [facility] in December 2025 with a NSTEMI. Cardiac cath with patent LIMA to LAD, SVG to PDA and RIMA to OM2. There was a severe stenosis in the native PDA distal to graft insertion. This was treated with a drug eluting stent. Echo December 2025 with LVEF=60-65%.   He is here today for follow up. The patient denies any chest pain, dyspnea, palpitations, lower extremity edema, orthopnea, PND, dizziness, near syncope or syncope.   Primary Care Physician: Okey Carlin Redbird, MD   Past Medical History:  Diagnosis Date   Allergy    seasonal- dust mites , tree pollen - worse in Spring    Anxiety    Arthritis    Coronary artery disease 07/13/2005   CABG   DDD (degenerative disc disease)    DJD (degenerative joint disease)    Elevated liver enzymes    GERD (gastroesophageal reflux disease)    Hemangioma of liver    Hemorrhoids    Hyperlipemia    Hypertension    PAC (premature atrial contraction)    Paroxysmal  atrial fibrillation (HCC)    1 run of A Fib only- after heart surgery    Pneumonia    PVC (premature ventricular contraction)     Past Surgical History:  Procedure Laterality Date   COLONOSCOPY  05/2020   CORONARY ARTERY BYPASS GRAFT  07/13/2005   CORONARY STENT INTERVENTION N/A 01/31/2024   Procedure: CORONARY STENT INTERVENTION;  Surgeon: Elmira Newman PARAS, MD;  Location: [facility];  Service: Cardiovascular;  Laterality: N/A;   INGUINAL HERNIA REPAIR Right 1978, 2000   x 2   KNEE SURGERY Right    LEFT HEART CATH AND CORS/GRAFTS ANGIOGRAPHY N/A 01/31/2024   Procedure: LEFT HEART CATH AND CORS/GRAFTS ANGIOGRAPHY;  Surgeon: Elmira Newman PARAS, MD;  Location: [facility];  Service: Cardiovascular;  Laterality: N/A;   SEPTOPLASTY     TRIGGER FINGER RELEASE Left 12/10/2023   Procedure: LEFT THUMB A1 PULLEY RELEASE, FOR TRIGGER FINGER;  Surgeon: Josefina Chew, MD;  Location: [facility];  Service: Orthopedics;  Laterality: Left;    Current Outpatient Medications  Medication Sig Dispense Refill   aspirin  EC 81 MG tablet Take 1 tablet (81 mg total) by mouth daily.     calcium  carbonate (TUMS - DOSED IN MG ELEMENTAL CALCIUM ) 500 MG chewable tablet Chew 1 tablet by mouth daily as needed.  Cholecalciferol (VITAMIN D) 2000 UNITS tablet Take 2,000 Units by mouth daily.     Coenzyme Q10 100 MG capsule Take 100 mg by mouth daily.     famotidine  (PEPCID ) 20 MG tablet Take 20 mg by mouth 2 (two) times daily.     Flaxseed, Linseed, POWD Take 2 scoop by mouth as directed.     Ganciclovir (ZIRGAN) 0.15 % GEL 1 application. As needed for cold sores     hydrocortisone 2.5 % cream Apply 1 Application topically daily as needed (Apply topically as needed).     LORazepam  (ATIVAN ) 0.5 MG tablet Take 1 tablet by mouth at bedtime as needed for sleep. To help with sleep     metoprolol  succinate (TOPROL -XL) 25 MG 24 hr tablet Take 1.5 tablets (37.5 mg total) by mouth 2 (two)  times daily. (Patient taking differently: Take 37.5 mg by mouth 2 (two) times daily. Per patient taking 3 tablets daily) 270 tablet 1   Multiple Vitamin (MULTIVITAMIN WITH MINERALS) TABS Take 1 tablet by mouth daily. Centrum silver     nitroGLYCERIN  (NITROSTAT ) 0.4 MG SL tablet Place 1 tablet (0.4 mg total) under the tongue every 5 (five) minutes as needed for chest pain (3 DOSES MAX). 25 tablet 6   pramoxine-hydrocortisone (ANALPRAM HC) cream Apply 1 application topically at bedtime as needed (for skin).      pramoxine-hydrocortisone (PROCTOCREAM-HC) 1-1 % rectal cream Apply topically.     rosuvastatin  (CRESTOR ) 20 MG tablet TAKE 1 TABLET BY MOUTH DAILY 90 tablet 3   ticagrelor  (BRILINTA ) 90 MG TABS tablet Take 1 tablet (90 mg total) by mouth 2 (two) times daily. 180 tablet 2   amLODipine  (NORVASC ) 2.5 MG tablet Take 1 tablet (2.5 mg total) by mouth daily. (Patient not taking: Reported on 03/23/2024) 90 tablet 3   traMADol  (ULTRAM ) 50 MG tablet Take 1 tablet (50 mg total) by mouth every 6 (six) hours as needed for severe pain (pain score 7-10). (Patient not taking: Reported on 03/23/2024) 6 tablet 0   No current facility-administered medications for this visit.   Facility-Administered Medications Ordered in Other Visits  Medication Dose Route Frequency Provider Last Rate Last Admin   regadenoson  (LEXISCAN ) injection SOLN 0.4 mg  0.4 mg Intravenous Once Nahser, Aleene PARAS, MD        Allergies  Allergen Reactions   Cephalexin Other (See Comments)    REACTION: tachycardia Rapid heart beat Tolerated Ancef  12/10/23   Levofloxacin Other (See Comments)    REACTION: tachycardia Rapid heartbeat   Zoster Vac Recomb Adjuvanted Other (See Comments)    Shakes, drove him crazy    Clarithromycin Other (See Comments)    Mouth got white spots.   Penicillins Rash and Other (See Comments)    REACTION: rash Unsure   Esomeprazole  Other (See Comments)   Fluticasone Other (See Comments)   Other     Adhesive     Ciprofloxacin Diarrhea   Doxycycline Other (See Comments)   Lipitor [Atorvastatin] Palpitations    Muscle aches on lipitor 80 mg daily    Social History   Socioeconomic History   Marital status: Married    Spouse name: Not on file   Number of children: 1   Years of education: Not on file   Highest education level: Bachelor's degree (e.g., BA, AB, BS)  Occupational History   Occupation: retired    Associate Professor: RETIRED  Tobacco Use   Smoking status: Former    Current packs/day: 0.00    Types: Cigarettes  Quit date: 02/19/1974    Years since quitting: 50.1   Smokeless tobacco: Never   Tobacco comments:    stopped 32 yrs ago  Vaping Use   Vaping status: Never Used  Substance and Sexual Activity   Alcohol use: Yes    Comment: rare   Drug use: No   Sexual activity: Not on file  Other Topics Concern   Not on file  Social History Narrative   Pt lives in [place] with spouse.  Son is grown.  Retired from [employer].     patient rides bicycle 7 to 8  Mile a day in a 1/2hr.   Social Drivers of Health   Tobacco Use: Medium Risk (03/23/2024)   Patient History    Smoking Tobacco Use: Former    Smokeless Tobacco Use: Never    Passive Exposure: Not on file  Financial Resource Strain: Low Risk (03/19/2024)   Overall Financial Resource Strain (CARDIA)    Difficulty of Paying Living Expenses: Not hard at all  Food Insecurity: No Food Insecurity (03/19/2024)   Epic    Worried About Programme Researcher, Broadcasting/film/video in the Last Year: Never true    Ran Out of Food in the Last Year: Never true  Transportation Needs: No Transportation Needs (03/19/2024)   Epic    Lack of Transportation (Medical): No    Lack of Transportation (Non-Medical): No  Physical Activity: Sufficiently Active (03/19/2024)   Exercise Vital Sign    Days of Exercise per Week: 7 days    Minutes of Exercise per Session: 60 min  Stress: No Stress Concern Present (03/19/2024)   [employer] of Occupational Health - Occupational  Stress Questionnaire    Feeling of Stress: Only a little  Social Connections: Moderately Integrated (03/19/2024)   Social Connection and Isolation Panel    Frequency of Communication with Friends and Family: More than three times a week    Frequency of Social Gatherings with Friends and Family: Once a week    Attends Religious Services: 1 to 4 times per year    Active Member of [employer] or Organizations: No    Attends Engineer, Structural: Not on file    Marital Status: Married  Intimate Partner Violence: Unknown (05/26/2021)   Received from [facility]   HITS    Physically Hurt: Not on file    Insult or Talk Down To: Not on file    Threaten Physical Harm: Not on file    Scream or Curse: Not on file  Depression (EYV7-0): Not on file  Alcohol Screen: Not on file  Housing: Unknown (03/19/2024)   Epic    Unable to Pay for Housing in the Last Year: No    Number of Times Moved in the Last Year: Not on file    Homeless in the Last Year: No  Utilities: Not on file  Health Literacy: Not on file    Family History  Problem Relation Age of Onset   Coronary artery disease Father    Heart attack Father    Atrial fibrillation Mother 13   Colon polyps Mother    Seizures Brother    Prostate cancer Maternal Uncle    Heart attack Maternal Grandfather    Heart attack Paternal Grandmother    Heart attack Paternal Grandfather    Colon cancer Neg Hx    Esophageal cancer Neg Hx    Rectal cancer Neg Hx    Stomach cancer Neg Hx     Review of Systems:  As stated in the HPI and otherwise negative.   BP 120/60 (BP Location: Left Arm, Patient Position: Sitting, Cuff Size: Normal)   Pulse 88   Ht 5' 8 (1.727 m)   Wt 170 lb 3.2 oz (77.2 kg)   SpO2 98%   BMI 25.88 kg/m   Physical Examination: General: Well developed, well nourished, NAD  SKIN: warm, dry. Neuro: No focal deficits  Psychiatric: Mood and affect normal  Neck: No JVD Lungs:Clear bilaterally, no wheezes, rhonci,  crackles Cardiovascular: Regular rate and rhythm. No murmurs, gallops or rubs. Abdomen:Soft.  Extremities: No lower extremity edema.    EKG:  EKG is not ordered today. The ekg ordered today demonstrates   Recent Labs: 11/12/2023: ALT 27 01/31/2024: BUN 18; Creatinine, Ser 0.91; Magnesium  2.4; Potassium 4.3; Sodium 137 02/12/2024: Hemoglobin 15.4; Platelets 235   Lipid Panel    Component Value Date/Time   CHOL 119 11/12/2023 0927   CHOL 121 02/09/2016 1206   CHOL 124 07/28/2014 0903   TRIG 97 11/12/2023 0927   TRIG 108 01/23/2017 1030   TRIG 112 07/28/2014 0903   HDL 44 11/12/2023 0927   HDL 46 01/23/2017 1030   HDL 46 07/28/2014 0903   CHOLHDL 2.7 11/12/2023 0927   CHOLHDL 2.5 02/09/2016 1206   LDLCALC 57 11/12/2023 0927   LDLCALC 53 02/09/2016 1206   LDLCALC 56 07/28/2014 0903     Wt Readings from Last 3 Encounters:  03/23/24 170 lb 3.2 oz (77.2 kg)  03/04/24 173 lb 3.2 oz (78.6 kg)  02/19/24 169 lb (76.7 kg)    Assessment and Plan:   1. CAD s/p CABG without angina: No chest pain.  -Continue ASA, Brilinta , Toprol  and Crestor .     2. Hyperlipidemia: LDL 57 in September 2025.  -Continue Crestor    3. HTN: BP is controlled.  -Continue Toprol  and Norvasc   4. PVCs/PACs/SVT: No palpitations.  -Continue Toprol   Labs/ tests ordered today include:  No orders of the defined types were placed in this encounter.  Disposition:   F/U with me in one year   Signed, Lonni Cash, MD, [facility] 03/23/2024 3:52 PM    [facility] [address] [place], [place], [place]  [address] Phone: [phone]; Fax: [phone]    "

## 2024-03-23 NOTE — Patient Instructions (Signed)
 Medication Instructions:  The current medical regimen is effective;  continue present plan and medications.  *If you need a refill on your cardiac medications before your next appointment, please call your pharmacy*  Follow-Up: At [facility], you and your health needs are our priority.  As part of our continuing mission to provide you with exceptional heart care, our providers are all part of one team.  This team includes your primary Cardiologist (physician) and Advanced Practice Providers or APPs (Physician Assistants and Nurse Practitioners) who all work together to provide you with the care you need, when you need it.  Your next appointment:   6 month(s)  Provider:   Lonni Cash, MD    We recommend signing up for the patient portal called MyChart.  Sign up information is provided on this After Visit Summary.  MyChart is used to connect with patients for Virtual Visits (Telemedicine).  Patients are able to view lab/test results, encounter notes, upcoming appointments, etc.  Non-urgent messages can be sent to your provider as well.   To learn more about what you can do with MyChart, go to [email or web address].

## 2024-03-23 NOTE — Telephone Encounter (Signed)
 fyi
